# Patient Record
Sex: Male | Born: 1945
Health system: Southern US, Community
[De-identification: ages and names within clinical notes are randomized; demographics above are authoritative.]

## PROBLEM LIST (undated history)

## (undated) DIAGNOSIS — E785 Hyperlipidemia, unspecified: Secondary | ICD-10-CM

## (undated) DIAGNOSIS — I739 Peripheral vascular disease, unspecified: Secondary | ICD-10-CM

## (undated) DIAGNOSIS — C801 Malignant (primary) neoplasm, unspecified: Secondary | ICD-10-CM

## (undated) DIAGNOSIS — I10 Essential (primary) hypertension: Secondary | ICD-10-CM

## (undated) DIAGNOSIS — C3490 Malignant neoplasm of unspecified part of unspecified bronchus or lung: Secondary | ICD-10-CM

## (undated) HISTORY — PX: ESOPHAGOGASTRODUODENOSCOPY: SHX1529

## (undated) HISTORY — PX: BACK SURGERY: SHX140

## (undated) HISTORY — DX: Peripheral vascular disease, unspecified: I73.9

## (undated) HISTORY — PX: COLONOSCOPY: SHX174

---

## 2009-07-13 ENCOUNTER — Ambulatory Visit: Payer: Self-pay | Admitting: Internal Medicine

## 2009-08-28 ENCOUNTER — Ambulatory Visit: Payer: Self-pay | Admitting: Gastroenterology

## 2013-11-12 DIAGNOSIS — M7580 Other shoulder lesions, unspecified shoulder: Secondary | ICD-10-CM | POA: Insufficient documentation

## 2014-02-05 ENCOUNTER — Ambulatory Visit: Payer: Self-pay | Admitting: Gastroenterology

## 2014-02-08 LAB — PATHOLOGY REPORT

## 2014-03-15 DIAGNOSIS — M6530 Trigger finger, unspecified finger: Secondary | ICD-10-CM | POA: Insufficient documentation

## 2014-04-02 ENCOUNTER — Ambulatory Visit: Payer: Self-pay | Admitting: Gastroenterology

## 2014-06-04 DIAGNOSIS — Z86010 Personal history of colonic polyps: Secondary | ICD-10-CM | POA: Insufficient documentation

## 2014-06-10 ENCOUNTER — Ambulatory Visit: Payer: Self-pay | Admitting: Gastroenterology

## 2014-09-23 LAB — SURGICAL PATHOLOGY

## 2016-08-13 DIAGNOSIS — I70219 Atherosclerosis of native arteries of extremities with intermittent claudication, unspecified extremity: Secondary | ICD-10-CM | POA: Insufficient documentation

## 2017-05-22 ENCOUNTER — Emergency Department: Payer: Medicare Other

## 2017-05-22 ENCOUNTER — Other Ambulatory Visit: Payer: Self-pay

## 2017-05-22 ENCOUNTER — Encounter: Payer: Self-pay | Admitting: Emergency Medicine

## 2017-05-22 ENCOUNTER — Emergency Department
Admission: EM | Admit: 2017-05-22 | Discharge: 2017-05-22 | Disposition: A | Discharge status: Home / Self Care | Payer: Medicare Other | Attending: Emergency Medicine | Admitting: Emergency Medicine

## 2017-05-22 DIAGNOSIS — R05 Cough: Secondary | ICD-10-CM | POA: Diagnosis present

## 2017-05-22 DIAGNOSIS — R918 Other nonspecific abnormal finding of lung field: Secondary | ICD-10-CM

## 2017-05-22 DIAGNOSIS — F17200 Nicotine dependence, unspecified, uncomplicated: Secondary | ICD-10-CM | POA: Insufficient documentation

## 2017-05-22 DIAGNOSIS — M47812 Spondylosis without myelopathy or radiculopathy, cervical region: Secondary | ICD-10-CM | POA: Insufficient documentation

## 2017-05-22 HISTORY — DX: Malignant (primary) neoplasm, unspecified: C80.1

## 2017-05-22 LAB — CBC WITH DIFFERENTIAL/PLATELET
BASOS ABS: 0.1 10*3/uL (ref 0–0.1)
BASOS PCT: 1 %
EOS ABS: 0.4 10*3/uL (ref 0–0.7)
Eosinophils Relative: 4 %
HCT: 46.1 % (ref 40.0–52.0)
HEMOGLOBIN: 15 g/dL (ref 13.0–18.0)
Lymphocytes Relative: 15 %
Lymphs Abs: 1.7 10*3/uL (ref 1.0–3.6)
MCH: 29.1 pg (ref 26.0–34.0)
MCHC: 32.6 g/dL (ref 32.0–36.0)
MCV: 89.3 fL (ref 80.0–100.0)
Monocytes Absolute: 1.3 10*3/uL — ABNORMAL HIGH (ref 0.2–1.0)
Monocytes Relative: 11 %
NEUTROS PCT: 69 %
Neutro Abs: 8.3 10*3/uL — ABNORMAL HIGH (ref 1.4–6.5)
Platelets: 388 10*3/uL (ref 150–440)
RBC: 5.16 MIL/uL (ref 4.40–5.90)
RDW: 13.6 % (ref 11.5–14.5)
WBC: 11.9 10*3/uL — AB (ref 3.8–10.6)

## 2017-05-22 LAB — BASIC METABOLIC PANEL
Anion gap: 8 (ref 5–15)
BUN: 17 mg/dL (ref 6–20)
CALCIUM: 9.4 mg/dL (ref 8.9–10.3)
CHLORIDE: 102 mmol/L (ref 101–111)
CO2: 25 mmol/L (ref 22–32)
CREATININE: 1.13 mg/dL (ref 0.61–1.24)
GFR calc non Af Amer: >60 mL/min (ref >60)
Glucose, Bld: 110 mg/dL — ABNORMAL HIGH (ref 65–99)
Potassium: 4.8 mmol/L (ref 3.5–5.1)
SODIUM: 135 mmol/L (ref 135–145)

## 2017-05-22 MED ORDER — NAPROXEN 500 MG PO TABS
500.0000 mg | ORAL_TABLET | Freq: Once | ORAL | Status: AC
  Administered 2017-05-22: 500 mg via ORAL
  Filled 2017-05-22: qty 1

## 2017-05-22 MED ORDER — MELOXICAM 15 MG PO TABS: 15.0000 mg | ORAL_TABLET | Freq: Every day | ORAL | 0 refills | Status: DC

## 2017-05-22 MED ORDER — IOPAMIDOL (ISOVUE-300) INJECTION 61%
75.0000 mL | Freq: Once | INTRAVENOUS | Status: AC | PRN
  Administered 2017-05-22: 75 mL via INTRAVENOUS
  Filled 2017-05-22: qty 75

## 2017-05-22 NOTE — ED Notes (Signed)
Pain bilateral neck from the shoulders into the posterior head. Denies trauma. No stepoffs noted. Patient also c/o sore throat, nasal congestion, yellow mucous. Denies taking OTC meds.

## 2017-05-22 NOTE — ED Notes (Signed)
First nurse note   Presents with pain to neck and radiates into head  States pain started several months ago. Pain is worse today  denies any trauma or fever

## 2017-05-22 NOTE — ED Provider Notes (Signed)
[facility] Emergency Department Provider Note  ____________________________________________   First MD Initiated Contact with Patient 05/22/17 [id number]     (approximate)  I have reviewed the triage vital signs and the nursing notes.   HISTORY  Chief Complaint Cough and Neck Pain   HPI Marvin Williams is a 71 y.o. male who presents to the emergency department for evaluation of multiple medical complaints.   The patient states that he has not been to a doctor in many years because they are only concerned with throwing you a pill and sending you out the door.  He states that he does not take any medications because "those things will kill you."  He states that he has had a cough for at least a month which is now productive of yellow sputum.  He denies any fever.  He is also complaining of neck pain and stiffness that has been ongoing for the past several months.  He states that he has felt a lymph node on the left side of his neck for "a long time."  He has not sought medical attention for any of the above complaints nor has he attempted any alleviating measures.  He is here today, because he "wants to know what is wrong."  Past Medical History:  Diagnosis Date  . Cancer ([facility])    skin    There are no active problems to display for this patient.  Prior to Admission medications   Medication Sig Start Date End Date Taking? Authorizing Provider  meloxicam (MOBIC) 15 MG tablet Take 1 tablet (15 mg total) by mouth daily. 05/22/17   Victorino Dike, FNP    Allergies Patient has no known allergies.  No family history on file.  Social History Social History   Tobacco Use  . Smoking status: Current Every Day Smoker  . Smokeless tobacco: Never Used  Substance Use Topics  . Alcohol use: No    Frequency: Never  . Drug use: No    Review of Systems  Constitutional: No fever/chills Eyes: No visual changes. ENT: No sore throat. Cardiovascular: Denies chest  pain. Respiratory: Denies shortness of breath.  Positive for cough. Gastrointestinal: No abdominal pain.  No nausea, no vomiting.  Genitourinary: Negative for dysuria. Musculoskeletal: Negative for back pain.  Positive for neck pain. Skin: Negative for rash. Neurological: Negative for headaches, focal weakness or numbness. ____________________________________________   PHYSICAL EXAM:  VITAL SIGNS: ED Triage Vitals  Enc Vitals Group     BP 05/22/17 0804 (!) 163/90     Pulse Rate 05/22/17 0804 93     Resp 05/22/17 0804 18     Temp 05/22/17 0804 97.8 F (36.6 C)     Temp Source 05/22/17 0804 Oral     SpO2 05/22/17 0804 98 %     Weight 05/22/17 0804 150 lb (68 kg)     Height 05/22/17 0804 5\' 10"  (1.778 m)     Head Circumference --      Peak Flow --      Pain Score 05/22/17 0807 6     Pain Loc --      Pain Edu? --      Excl. in [place]? --     Constitutional: Alert and oriented. Well appearing and in no acute distress. Eyes: Conjunctivae are normal. PERRL.  Head: Atraumatic. Nose: No congestion/rhinnorhea. Mouth/Throat: Mucous membranes are moist.  Oropharynx non-erythematous. Neck: No stridor.  No focal midline tenderness with palpation over the cervical spine.  Paraspinal  tenderness on both sides with palpation and forward flexion of the neck. Cardiovascular: Normal rate, regular rhythm. Grossly normal heart sounds.  Good peripheral circulation. Respiratory: Normal respiratory effort.  No retractions. Lungs diminished throughout. Gastrointestinal: Soft and nontender. No distention. Musculoskeletal: No lower extremity tenderness nor edema.  No joint effusions. Neurologic:  Normal speech and language. No gross focal neurologic deficits are appreciated. No gait instability. Skin:  Skin is warm, dry and intact. No rash noted. Psychiatric: Mood and affect are normal. Speech and behavior are normal.  ____________________________________________   LABS (all labs ordered are listed,  but only abnormal results are displayed)  Labs Reviewed  CBC WITH DIFFERENTIAL/PLATELET - Abnormal; Notable for the following components:      Result Value   WBC 11.9 (*)    Neutro Abs 8.3 (*)    Monocytes Absolute 1.3 (*)    All other components within normal limits  BASIC METABOLIC PANEL - Abnormal; Notable for the following components:   Glucose, Bld 110 (*)    All other components within normal limits   ____________________________________________  EKG  Not indicated ____________________________________________  RADIOLOGY  Dg Chest 2 View  Result Date: 05/22/2017 CLINICAL DATA:  Cough. EXAM: CHEST  2 VIEW COMPARISON:  None. FINDINGS: The heart size and mediastinal contours are within normal limits. Atherosclerosis of thoracic aorta is noted. No pneumothorax or pleural effusion is noted. Diffuse interstitial densities are noted throughout both lungs most consistent with scarring or fibrosis, although acute superimposed edema or inflammation cannot be excluded. Right lower lobe mass is noted concerning for malignancy. The visualized skeletal structures are unremarkable. IMPRESSION: Right lower lobe mass is noted consistent with malignancy. CT scan of the chest is recommended for further evaluation. Mild diffuse interstitial densities are noted throughout both lungs consistent with scarring or fibrosis, although acute superimposed edema or inflammation cannot be excluded. Electronically Signed   By: Marijo Conception, M.D.   On: 05/22/2017 09:10   Dg Cervical Spine 2-3 Views  Result Date: 05/22/2017 CLINICAL DATA:  Neck pain for several months without specific injury. EXAM: CERVICAL SPINE - 2-3 VIEW COMPARISON:  None. FINDINGS: No evidence of fracture, bone lesion, or endplate erosion. Question ankylosis at C3-4 and C4-5. C5-6 and C6-7 predominant disc degeneration with spurring. Upper cervical facet arthropathy with spurring best seen at C2-3 and C3-4. No prevertebral thickening. Clear  apical lungs. IMPRESSION: Degenerative disease without acute or aggressive finding. Electronically Signed   By: Monte Fantasia M.D.   On: 05/22/2017 09:11   Ct Chest W Contrast  Result Date: 05/22/2017 CLINICAL DATA:  71 year old male with productive cough for the past month and a pulmonary mass seen on recent chest x-ray. Active smoker with a 55 pack year history. EXAM: CT CHEST WITH CONTRAST TECHNIQUE: Multidetector CT imaging of the chest was performed during intravenous contrast administration. CONTRAST:  81mL ISOVUE-300 IOPAMIDOL (ISOVUE-300) INJECTION 61% COMPARISON:  Chest x-ray 05/21/2017 FINDINGS: Cardiovascular: Conventional 3 vessel arch anatomy. No evidence of aneurysm. Atherosclerotic calcifications are present along the aorta. The heart is normal in size. No pericardial effusion. Atherosclerotic calcifications present along the coronary arteries. Pulmonary artery is normal in size. No central PE. Mediastinum/Nodes: Mediastinal lymph nodes are prominent but not enlarged by CT criteria. The largest measure up to 8 mm in short axis. Calcified sub coronal and right hilar nodes are present suggesting old granulomatous disease. Unremarkable thyroid gland. No mediastinal mass. The thoracic esophagus is unremarkable. Lungs/Pleura: Rounded mass with central low-attenuation in the posterior right lower  lobe measures 5.4 x 3.6 x 4.2 cm. The mass demonstrates a macrolobulated contour with subtle evidence of spiculation at the superior margin. Central low-attenuation is favored to reflect necrosis. There is a solitary dystrophic calcification at the superomedial margin. Background of relatively modest combined paraseptal and centrilobular pulmonary emphysema. Mild biapical pleuroparenchymal scarring. Calcified granuloma in the left lower lobe. No additional pulmonary nodules identified. Upper Abdomen: Circumscribed low-attenuation lesions within the liver are too small for accurate characterization but  statistically highly likely benign cysts. Similarly, there are numerous circumscribed water attenuation lesions within the visualized portions of the kidneys most consistent with simple cysts. Musculoskeletal: No acute fracture or aggressive appearing lytic or blastic osseous lesion. Multilevel degenerative disc disease. IMPRESSION: 1. Macrolobulated right lower lobe pulmonary mass with evidence of peripheral spiculation measures up to 5.4 cm and is highly concerning for a primary bronchogenic neoplasm. Central low-attenuation is consistent with central necrosis. Recommend further evaluation with PET-CT. No definitively enlarged by CT criteria mediastinal or hilar adenopathy. 2. Evidence of prior granulomatous disease with pulmonary granulomas in both lower lobes and calcified right hilar and mediastinal lymph nodes. 3. Modest combined centrilobular and paraseptal pulmonary emphysema. 4. Atherosclerosis including coronary artery and aortic calcifications. 5. Probable hepatic and bilateral renal cysts. Aortic Atherosclerosis (ICD10-I70.0) and Emphysema (ICD10-J43.9). Electronically Signed   By: Jacqulynn Cadet M.D.   On: 05/22/2017 10:45    ____________________________________________   PROCEDURES  Procedure(s) performed: None  Procedures  Critical Care performed: No  ____________________________________________   INITIAL IMPRESSION / ASSESSMENT AND PLAN / ED COURSE  71 year old male who presented to the emergency department for multiple medical complaints. Because of his 55 pack year smoking history and cough without fever or tachycardia for over a month, chest x-ray was completed followed by a chest CT. Results were discussed with the patient who was advised that it is imperative that he follow up with Dr. Tasia Catchings for further evaluation and diagnostic studies.   I spoke with Dr. Tasia Catchings who verbalized a plan to have the office contact the patient tomorrow to set up an appointment. He was made aware of  this, but would not give a clear answer of his intention to follow up. He was made aware that his condition is likely going to worsen, especially if he does not comply with treatment. He was discharged home, but advised to follow up with a primary care provider or return to the ER if he becomes short of breath or develops other symptoms of concern before seeing Dr. Tasia Catchings. His neck pain will be treated with Meloxicam. ____________________________________________   FINAL CLINICAL IMPRESSION(S) / ED DIAGNOSES  Final diagnoses:  Right lower lobe lung mass  Spondylosis of cervical region without myelopathy or radiculopathy     ED Discharge Orders        Ordered    meloxicam (MOBIC) 15 MG tablet  Daily     05/22/17 1145       Note:  This document was prepared using Dragon voice recognition software and may include unintentional dictation errors.    Victorino Dike, FNP 05/22/17 1559    Nena Polio, MD 05/22/17 2100

## 2017-05-22 NOTE — ED Triage Notes (Signed)
Pt to ED via POV c/o cough with yellow sputum x 1 month. Pt also c/o neck pain, states that it hurts to turn his head. Pt also fells like lymph nodes in his neck are swollen. Pt states that he has used OTC medications and it has helped with the pain in the neck but it does not completely go away. Pt states that he is having pain in the back of his head, head is tender to touch. Pt states that he is not sure if he has been running fever or not.

## 2017-05-26 ENCOUNTER — Other Ambulatory Visit: Payer: Self-pay

## 2017-05-26 ENCOUNTER — Encounter: Payer: Self-pay | Admitting: Oncology

## 2017-05-26 ENCOUNTER — Inpatient Hospital Stay: Payer: Medicare Other | Attending: Oncology | Admitting: Oncology

## 2017-05-26 ENCOUNTER — Encounter: Payer: Self-pay | Admitting: *Deleted

## 2017-05-26 VITALS — BP 179/79 | HR 69 | Temp 97.0°F | Ht 68.0 in | Wt 157.5 lb

## 2017-05-26 DIAGNOSIS — R918 Other nonspecific abnormal finding of lung field: Secondary | ICD-10-CM

## 2017-05-26 DIAGNOSIS — M542 Cervicalgia: Secondary | ICD-10-CM

## 2017-05-26 DIAGNOSIS — R030 Elevated blood-pressure reading, without diagnosis of hypertension: Secondary | ICD-10-CM | POA: Diagnosis not present

## 2017-05-26 DIAGNOSIS — F17210 Nicotine dependence, cigarettes, uncomplicated: Secondary | ICD-10-CM

## 2017-05-26 DIAGNOSIS — R05 Cough: Secondary | ICD-10-CM | POA: Insufficient documentation

## 2017-05-26 DIAGNOSIS — Z85828 Personal history of other malignant neoplasm of skin: Secondary | ICD-10-CM

## 2017-05-26 MED ORDER — NICOTINE 21-14-7 MG/24HR TD KIT: PACK | TRANSDERMAL | 0 refills | Status: DC

## 2017-05-26 NOTE — Progress Notes (Signed)
Patient here today as a new patient, referred by ED for lung mass

## 2017-05-26 NOTE — Progress Notes (Signed)
Hematology/Oncology Consult note [facility] Telephone:([phone] [phone]   Patient Care Team: Derinda Late, MD as PCP - General (Family Medicine)  REFERRING PROVIDER: Emergency room physician. CHIEF COMPLAINTS/PURPOSE OF CONSULTATION:  Lung mass evaluation.  HISTORY OF PRESENTING ILLNESS:  Marvin Williams is a  71 y.o.  male with PMH listed below who was referred to me for evaluation of lung mass. Patient presented to emergency room on 05/22/2017 complaining about neck pain, tightness. He also had a chronic cough. A CT chest scan was done which showed a concerning right bone mass. ER physician referred patient to see me for management. Patient reports that she is feeling better after he quit smoking since his ER visit. He request nicotine patches. He still has some cough. He reports that his chronic congestion postnasal drip has improved. He takes Mobic as needed for his neck pain which helps improving the neck discomfort. Denies weight loss, hemoptysis, headache, double vision. He does not take any other medication and denies any medical history. Is accompanied by his wife and daughter today.  Review of Systems  Constitutional: Negative for chills and weight loss.  HENT: Negative for hearing loss.   Eyes: Negative for double vision.  Respiratory: Positive for cough. Negative for hemoptysis.   Cardiovascular: Negative for chest pain.  Gastrointestinal: Negative for nausea.  Genitourinary: Negative for dysuria.  Musculoskeletal: Negative for myalgias.  Skin: Negative for rash.  Neurological: Negative for dizziness.  Endo/Heme/Allergies: Does not bruise/bleed easily.  Psychiatric/Behavioral: Negative for depression.    MEDICAL HISTORY:  Past Medical History:  Diagnosis Date  . Cancer [facility])    skin    SURGICAL HISTORY: Past Surgical History:  Procedure Laterality Date  . BACK SURGERY    . COLONOSCOPY      SOCIAL HISTORY: Social  History   Socioeconomic History  . Marital status: Married    Spouse name: Not on file  . Number of children: Not on file  . Years of education: Not on file  . Highest education level: Not on file  Social Needs  . Financial resource strain: Not on file  . Food insecurity - worry: Not on file  . Food insecurity - inability: Not on file  . Transportation needs - medical: Not on file  . Transportation needs - non-medical: Not on file  Occupational History  . Not on file  Tobacco Use  . Smoking status: Current Every Day Smoker  . Smokeless tobacco: Never Used  . Tobacco comment: Last cigarette on 05/22/17  Substance and Sexual Activity  . Alcohol use: No    Frequency: Never  . Drug use: No  . Sexual activity: Not on file  Other Topics Concern  . Not on file  Social History Narrative  . Not on file    FAMILY HISTORY: History reviewed. No pertinent family history.  ALLERGIES:  has No Known Allergies.  MEDICATIONS:  Current Outpatient Medications  Medication Sig Dispense Refill  . meloxicam (MOBIC) 15 MG tablet Take 1 tablet (15 mg total) by mouth daily. 30 tablet 0  . naproxen sodium (ALEVE) 220 MG tablet Take 220 mg by mouth.    . Nicotine 21-14-7 MG/24HR KIT Week 1-4, use 60m patch/day weeky 5-6 use 126mpatch/day Week 7-8 use 59m29match/day 1 each 0   No current facility-administered medications for this visit.      PHYSICAL EXAMINATION: ECOG PERFORMANCE STATUS: 0 - Asymptomatic Vitals:   05/26/17 1553  BP: (!) 179/79  Pulse: 69  Temp: (!) 97 F (36.1 C)  SpO2: 98%   Filed Weights   05/26/17 1553  Weight: 157 lb 8 oz (71.4 kg)    Physical Exam  Constitutional: He is oriented to person, place, and time and well-developed, well-nourished, and in no distress. No distress.  HENT:  Head: Normocephalic and atraumatic.  Mouth/Throat: No oropharyngeal exudate.  Eyes: Conjunctivae and EOM are normal. Pupils are equal, round, and reactive to light.  Neck:  Normal range of motion. Neck supple. No JVD present.  Cardiovascular: Normal rate, regular rhythm and normal heart sounds. Exam reveals no friction rub.  Pulmonary/Chest: Effort normal and breath sounds normal. He has no wheezes.  Abdominal: Soft. Bowel sounds are normal. He exhibits no distension.  Musculoskeletal: Normal range of motion. He exhibits no edema.  Lymphadenopathy:    He has no cervical adenopathy.  Neurological: He is alert and oriented to person, place, and time.  Skin: Skin is warm and dry.  Psychiatric: Affect normal.     LABORATORY DATA:  I have reviewed the data as listed Lab Results  Component Value Date   WBC 11.9 (H) 05/22/2017   HGB 15.0 05/22/2017   HCT 46.1 05/22/2017   MCV 89.3 05/22/2017   PLT 388 05/22/2017   Recent Labs    05/22/17 0940  NA 135  K 4.8  CL 102  CO2 25  GLUCOSE 110*  BUN 17  CREATININE 1.13  CALCIUM 9.4  GFRNONAA >60  GFRAA >60       ASSESSMENT & PLAN:  1. Lung mass   2. Elevated blood pressure reading    #Image results was discussed with patient. I also showed patient CT images per patient's request. Explained to patient that abdominal masses concerning for malignant process which required additional workup. I suggest PET scan followed by CT-guided lung mass biopsy. Most likely he'll also need MRI of brain. Patient voices understanding and agreed with the plan.  # Elevated blood pressure readings can be secondary to anxiety. We'll continue monitor.  #I provided him a prescription for nicotine patches Kit and encourage him to continue cessation of smoking.   All questions were answered. The patient knows to call the clinic with any problems questions or concerns.  Return of visit: A few days after CT-guided biopsy  To discuss about pathology result.  Thank you for this kind referral and the opportunity to participate in the care of this patient. A copy of today's note is routed to referring provider    Earlie Server, MD,  PhD Hematology Oncology [facility] at [facility] Pager- [phone] 05/26/2017

## 2017-05-27 NOTE — Progress Notes (Signed)
  Oncology Nurse Navigator Documentation  Navigator Location: [facility]-Med Onc (05/26/17 1630) Referral date to RadOnc/MedOnc: 05/22/17 (05/26/17 1630) )Navigator Encounter Type: Initial MedOnc (05/26/17 1630)   Abnormal Finding Date: 05/22/17 (05/26/17 1630)                   Treatment Phase: Abnormal Scans (05/26/17 1630) Barriers/Navigation Needs: Coordination of Care (05/26/17 1630)   Interventions: Coordination of Care (05/26/17 1630)   Coordination of Care: Appts;Radiology (05/26/17 1630)        Acuity: Level 2 (05/26/17 1630)   Acuity Level 2: Initial guidance, education and coordination as needed;Educational needs;Assistance expediting appointments (05/26/17 1630)  met with patient and family during initial med-onc consultation with Dr. Tasia Catchings. All questions answered at the time of visit. Assistance provided to expedite appts for PET scan, brain MRI, and percutaneous lung biopsy. Informed pt that will be notified when PET and MRI are scheduled. PET and MRI will be scheduled on same day per pt request. Pt informed that after PET scan results are available will plan for biopsy of lung mass. Informed pt that will be in touch after PET scan with appt for biopsy. Contact info given to pt and family and instructed to call with any further questions or needs. Pt and family verbalized understanding. Nothing further needed at this time.   Time Spent with Patient: 60 (05/26/17 1630)

## 2017-06-02 ENCOUNTER — Ambulatory Visit
Admission: RE | Admit: 2017-06-02 | Discharge: 2017-06-02 | Disposition: A | Discharge status: Home / Self Care | Payer: Medicare Other | Source: Ambulatory Visit | Attending: Oncology | Admitting: Oncology

## 2017-06-02 ENCOUNTER — Encounter
Admission: RE | Admit: 2017-06-02 | Discharge: 2017-06-02 | Disposition: A | Discharge status: Home / Self Care | Payer: Medicare Other | Source: Ambulatory Visit | Attending: Oncology | Admitting: Oncology

## 2017-06-02 DIAGNOSIS — I700 Atherosclerosis of aorta: Secondary | ICD-10-CM | POA: Diagnosis not present

## 2017-06-02 DIAGNOSIS — J0100 Acute maxillary sinusitis, unspecified: Secondary | ICD-10-CM | POA: Diagnosis not present

## 2017-06-02 DIAGNOSIS — J439 Emphysema, unspecified: Secondary | ICD-10-CM | POA: Insufficient documentation

## 2017-06-02 DIAGNOSIS — N400 Enlarged prostate without lower urinary tract symptoms: Secondary | ICD-10-CM | POA: Diagnosis not present

## 2017-06-02 DIAGNOSIS — R030 Elevated blood-pressure reading, without diagnosis of hypertension: Secondary | ICD-10-CM | POA: Diagnosis not present

## 2017-06-02 DIAGNOSIS — R918 Other nonspecific abnormal finding of lung field: Secondary | ICD-10-CM | POA: Diagnosis not present

## 2017-06-02 DIAGNOSIS — C3490 Malignant neoplasm of unspecified part of unspecified bronchus or lung: Secondary | ICD-10-CM | POA: Diagnosis not present

## 2017-06-02 LAB — GLUCOSE, CAPILLARY: GLUCOSE-CAPILLARY: 92 mg/dL (ref 65–99)

## 2017-06-02 MED ORDER — FLUDEOXYGLUCOSE F - 18 (FDG) INJECTION
12.7100 | Freq: Once | INTRAVENOUS | Status: AC | PRN
  Administered 2017-06-02: 12.71 via INTRAVENOUS

## 2017-06-02 MED ORDER — GADOBENATE DIMEGLUMINE 529 MG/ML IV SOLN
15.0000 mL | Freq: Once | INTRAVENOUS | Status: AC | PRN
  Administered 2017-06-02: 14 mL via INTRAVENOUS

## 2017-06-09 ENCOUNTER — Other Ambulatory Visit: Payer: Self-pay | Admitting: Radiology

## 2017-06-10 ENCOUNTER — Other Ambulatory Visit: Payer: Self-pay | Admitting: Radiology

## 2017-06-13 ENCOUNTER — Ambulatory Visit
Admission: RE | Admit: 2017-06-13 | Discharge: 2017-06-13 | Disposition: A | Discharge status: Home / Self Care | Payer: Medicare Other | Source: Ambulatory Visit | Attending: Oncology | Admitting: Oncology

## 2017-06-13 DIAGNOSIS — Z9889 Other specified postprocedural states: Secondary | ICD-10-CM | POA: Diagnosis not present

## 2017-06-13 DIAGNOSIS — C3431 Malignant neoplasm of lower lobe, right bronchus or lung: Secondary | ICD-10-CM | POA: Diagnosis not present

## 2017-06-13 DIAGNOSIS — Z79899 Other long term (current) drug therapy: Secondary | ICD-10-CM | POA: Diagnosis not present

## 2017-06-13 DIAGNOSIS — N320 Bladder-neck obstruction: Secondary | ICD-10-CM | POA: Insufficient documentation

## 2017-06-13 DIAGNOSIS — J0100 Acute maxillary sinusitis, unspecified: Secondary | ICD-10-CM | POA: Diagnosis not present

## 2017-06-13 DIAGNOSIS — R0789 Other chest pain: Secondary | ICD-10-CM | POA: Diagnosis not present

## 2017-06-13 DIAGNOSIS — Z85828 Personal history of other malignant neoplasm of skin: Secondary | ICD-10-CM | POA: Insufficient documentation

## 2017-06-13 DIAGNOSIS — R918 Other nonspecific abnormal finding of lung field: Secondary | ICD-10-CM | POA: Diagnosis not present

## 2017-06-13 DIAGNOSIS — R590 Localized enlarged lymph nodes: Secondary | ICD-10-CM | POA: Insufficient documentation

## 2017-06-13 DIAGNOSIS — M542 Cervicalgia: Secondary | ICD-10-CM | POA: Diagnosis not present

## 2017-06-13 DIAGNOSIS — J8410 Pulmonary fibrosis, unspecified: Secondary | ICD-10-CM | POA: Insufficient documentation

## 2017-06-13 DIAGNOSIS — N400 Enlarged prostate without lower urinary tract symptoms: Secondary | ICD-10-CM | POA: Diagnosis not present

## 2017-06-13 DIAGNOSIS — I700 Atherosclerosis of aorta: Secondary | ICD-10-CM | POA: Insufficient documentation

## 2017-06-13 DIAGNOSIS — Z791 Long term (current) use of non-steroidal anti-inflammatories (NSAID): Secondary | ICD-10-CM | POA: Insufficient documentation

## 2017-06-13 DIAGNOSIS — F17200 Nicotine dependence, unspecified, uncomplicated: Secondary | ICD-10-CM | POA: Insufficient documentation

## 2017-06-13 DIAGNOSIS — J439 Emphysema, unspecified: Secondary | ICD-10-CM | POA: Diagnosis not present

## 2017-06-13 LAB — CBC
HEMATOCRIT: 45.6 % (ref 40.0–52.0)
Hemoglobin: 15.1 g/dL (ref 13.0–18.0)
MCH: 30 pg (ref 26.0–34.0)
MCHC: 33.2 g/dL (ref 32.0–36.0)
MCV: 90.4 fL (ref 80.0–100.0)
Platelets: 316 10*3/uL (ref 150–440)
RBC: 5.05 MIL/uL (ref 4.40–5.90)
RDW: 14.2 % (ref 11.5–14.5)
WBC: 10.2 10*3/uL (ref 3.8–10.6)

## 2017-06-13 LAB — APTT: aPTT: 39 seconds — ABNORMAL HIGH (ref 24–36)

## 2017-06-13 LAB — PROTIME-INR
INR: 0.92
Prothrombin Time: 12.3 seconds (ref 11.4–15.2)

## 2017-06-13 MED ORDER — SODIUM CHLORIDE 0.9 % IV SOLN
INTRAVENOUS | Status: DC
  Administered 2017-06-13: 10:00:00 via INTRAVENOUS

## 2017-06-13 MED ORDER — HYDRALAZINE HCL 20 MG/ML IJ SOLN: 10.0000 mg | Freq: Once | INTRAMUSCULAR | Status: DC

## 2017-06-13 MED ORDER — MIDAZOLAM HCL 5 MG/5ML IJ SOLN
INTRAMUSCULAR | Status: AC | PRN
  Administered 2017-06-13: 0.5 mg via INTRAVENOUS
  Administered 2017-06-13: 1 mg via INTRAVENOUS
  Administered 2017-06-13: 0.5 mg via INTRAVENOUS

## 2017-06-13 MED ORDER — MIDAZOLAM HCL 5 MG/5ML IJ SOLN
INTRAMUSCULAR | Status: AC
  Filled 2017-06-13: qty 5

## 2017-06-13 MED ORDER — FENTANYL CITRATE (PF) 100 MCG/2ML IJ SOLN
INTRAMUSCULAR | Status: AC | PRN
  Administered 2017-06-13: 50 ug via INTRAVENOUS

## 2017-06-13 MED ORDER — LIDOCAINE HCL (PF) 1 % IJ SOLN
INTRAMUSCULAR | Status: AC | PRN
  Administered 2017-06-13: 5 mL

## 2017-06-13 MED ORDER — FENTANYL CITRATE (PF) 100 MCG/2ML IJ SOLN
INTRAMUSCULAR | Status: AC
  Filled 2017-06-13: qty 4

## 2017-06-13 NOTE — H&P (Signed)
Chief Complaint: Patient was seen in consultation today for lung mass  Referring Physician(s): Yu,Zhou  Supervising Physician: Aletta Edouard  Patient Status: [facility] - Out-pt  History of Present Illness: Marvin Williams is a 72 y.o. male with past medical history of skin cancer, and chronic cough who recently presented to [facility] ED with chest tightness and neck pain.   CT Chest 05/22/17 showed: 1. Macrolobulated right lower lobe pulmonary mass with evidence of peripheral spiculation measures up to 5.4 cm and is highly concerning for a primary bronchogenic neoplasm. Central low-attenuation is consistent with central necrosis. Recommend further evaluation with PET-CT. No definitively enlarged by CT criteria mediastinal or hilar adenopathy. 2. Evidence of prior granulomatous disease with pulmonary granulomas in both lower lobes and calcified right hilar and mediastinal lymph nodes. 3. Modest combined centrilobular and paraseptal pulmonary emphysema. 4. Atherosclerosis including coronary artery and aortic calcifications. 5. Probable hepatic and bilateral renal cysts.  PET 06/02/17 showed: 6 cm hypermetabolic mass in posterior right lower lobe, consistent with bronchogenic carcinoma.  IR consulted for lung mass biopsy at the request of Dr. Tasia Catchings. Case reviewed and approved by Dr. Anselm Pancoast.   Patient presents for procedure today in his usual state of health.  He has been NPO.  He does not take blood thinners.   Past Medical History:  Diagnosis Date  . Cancer [facility])    skin    Past Surgical History:  Procedure Laterality Date  . BACK SURGERY    . COLONOSCOPY      Allergies: Patient has no known allergies.  Medications: Prior to Admission medications   Medication Sig Start Date End Date Taking? Authorizing Provider  meloxicam (MOBIC) 15 MG tablet Take 1 tablet (15 mg total) by mouth daily. 05/22/17  Yes Triplett, Cari B, FNP  naproxen sodium (ALEVE) 220 MG tablet Take 220 mg  by mouth.    [provider]  Nicotine 21-14-7 MG/24HR KIT Week 1-4, use 13m patch/day weeky 5-6 use 156mpatch/day Week 7-8 use 67m65match/day Patient not taking: Reported on 06/13/2017 05/26/17   Yu,Earlie ServerD     History reviewed. No pertinent family history.  Social History   Socioeconomic History  . Marital status: Married    Spouse name: None  . Number of children: None  . Years of education: None  . Highest education level: None  Social Needs  . Financial resource strain: None  . Food insecurity - worry: None  . Food insecurity - inability: None  . Transportation needs - medical: None  . Transportation needs - non-medical: None  Occupational History  . None  Tobacco Use  . Smoking status: Current Every Day Smoker  . Smokeless tobacco: Never Used  . Tobacco comment: Last cigarette on 05/22/17  Substance and Sexual Activity  . Alcohol use: No    Frequency: Never  . Drug use: No  . Sexual activity: None  Other Topics Concern  . None  Social History Narrative  . None    Review of Systems  Constitutional: Negative for fatigue and fever.  Respiratory: Negative for cough and shortness of breath.   Cardiovascular: Negative for chest pain.  Gastrointestinal: Negative for abdominal pain.  Musculoskeletal: Negative for back pain and gait problem.  Psychiatric/Behavioral: Negative for behavioral problems and confusion.    Vital Signs: BP (!) 183/90   Pulse 70   Temp 97.6 F (36.4 C) (Oral)   Resp 18   Ht 5' 8" (1.727 m)   Wt 150 lb (68  kg)   SpO2 99%   BMI 22.81 kg/m   Physical Exam  Constitutional: He is oriented to person, place, and time. He appears well-developed.  Cardiovascular: Normal rate, regular rhythm and normal heart sounds.  Pulmonary/Chest: Effort normal and breath sounds normal. No respiratory distress.  Abdominal: Soft.  Neurological: He is alert and oriented to person, place, and time.  Skin: Skin is warm and dry.  Psychiatric: He  has a normal mood and affect. His behavior is normal. Judgment and thought content normal.  Nursing note and vitals reviewed.   Imaging: Dg Chest 2 View  Result Date: 05/22/2017 CLINICAL DATA:  Cough. EXAM: CHEST  2 VIEW COMPARISON:  None. FINDINGS: The heart size and mediastinal contours are within normal limits. Atherosclerosis of thoracic aorta is noted. No pneumothorax or pleural effusion is noted. Diffuse interstitial densities are noted throughout both lungs most consistent with scarring or fibrosis, although acute superimposed edema or inflammation cannot be excluded. Right lower lobe mass is noted concerning for malignancy. The visualized skeletal structures are unremarkable. IMPRESSION: Right lower lobe mass is noted consistent with malignancy. CT scan of the chest is recommended for further evaluation. Mild diffuse interstitial densities are noted throughout both lungs consistent with scarring or fibrosis, although acute superimposed edema or inflammation cannot be excluded. Electronically Signed   By: Marijo Conception, M.D.   On: 05/22/2017 09:10   Dg Cervical Spine 2-3 Views  Result Date: 05/22/2017 CLINICAL DATA:  Neck pain for several months without specific injury. EXAM: CERVICAL SPINE - 2-3 VIEW COMPARISON:  None. FINDINGS: No evidence of fracture, bone lesion, or endplate erosion. Question ankylosis at C3-4 and C4-5. C5-6 and C6-7 predominant disc degeneration with spurring. Upper cervical facet arthropathy with spurring best seen at C2-3 and C3-4. No prevertebral thickening. Clear apical lungs. IMPRESSION: Degenerative disease without acute or aggressive finding. Electronically Signed   By: Monte Fantasia M.D.   On: 05/22/2017 09:11   Ct Chest W Contrast  Result Date: 05/22/2017 CLINICAL DATA:  72 year old male with productive cough for the past month and a pulmonary mass seen on recent chest x-ray. Active smoker with a 55 pack year history. EXAM: CT CHEST WITH CONTRAST TECHNIQUE:  Multidetector CT imaging of the chest was performed during intravenous contrast administration. CONTRAST:  25m ISOVUE-300 IOPAMIDOL (ISOVUE-300) INJECTION 61% COMPARISON:  Chest x-ray 05/21/2017 FINDINGS: Cardiovascular: Conventional 3 vessel arch anatomy. No evidence of aneurysm. Atherosclerotic calcifications are present along the aorta. The heart is normal in size. No pericardial effusion. Atherosclerotic calcifications present along the coronary arteries. Pulmonary artery is normal in size. No central PE. Mediastinum/Nodes: Mediastinal lymph nodes are prominent but not enlarged by CT criteria. The largest measure up to 8 mm in short axis. Calcified sub coronal and right hilar nodes are present suggesting old granulomatous disease. Unremarkable thyroid gland. No mediastinal mass. The thoracic esophagus is unremarkable. Lungs/Pleura: Rounded mass with central low-attenuation in the posterior right lower lobe measures 5.4 x 3.6 x 4.2 cm. The mass demonstrates a macrolobulated contour with subtle evidence of spiculation at the superior margin. Central low-attenuation is favored to reflect necrosis. There is a solitary dystrophic calcification at the superomedial margin. Background of relatively modest combined paraseptal and centrilobular pulmonary emphysema. Mild biapical pleuroparenchymal scarring. Calcified granuloma in the left lower lobe. No additional pulmonary nodules identified. Upper Abdomen: Circumscribed low-attenuation lesions within the liver are too small for accurate characterization but statistically highly likely benign cysts. Similarly, there are numerous circumscribed water attenuation lesions within the  visualized portions of the kidneys most consistent with simple cysts. Musculoskeletal: No acute fracture or aggressive appearing lytic or blastic osseous lesion. Multilevel degenerative disc disease. IMPRESSION: 1. Macrolobulated right lower lobe pulmonary mass with evidence of peripheral  spiculation measures up to 5.4 cm and is highly concerning for a primary bronchogenic neoplasm. Central low-attenuation is consistent with central necrosis. Recommend further evaluation with PET-CT. No definitively enlarged by CT criteria mediastinal or hilar adenopathy. 2. Evidence of prior granulomatous disease with pulmonary granulomas in both lower lobes and calcified right hilar and mediastinal lymph nodes. 3. Modest combined centrilobular and paraseptal pulmonary emphysema. 4. Atherosclerosis including coronary artery and aortic calcifications. 5. Probable hepatic and bilateral renal cysts. Aortic Atherosclerosis (ICD10-I70.0) and Emphysema (ICD10-J43.9). Electronically Signed   By: Jacqulynn Cadet M.D.   On: 05/22/2017 10:45   Mr Jeri Cos VH Contrast  Result Date: 06/03/2017 CLINICAL DATA:  Initial evaluation for lung cancer, staging exam. EXAM: MRI HEAD WITHOUT AND WITH CONTRAST TECHNIQUE: Multiplanar, multiecho pulse sequences of the brain and surrounding structures were obtained without and with intravenous contrast. CONTRAST:  4m MULTIHANCE GADOBENATE DIMEGLUMINE 529 MG/ML IV SOLN COMPARISON:  None available. FINDINGS: Brain: Age related cerebral atrophy. Mild chronic small vessel ischemic disease present within the periventricular white matter. No evidence for acute infarct. Gray-white matter differentiation maintained. No evidence for chronic infarction. No foci of susceptibility artifact to suggest acute or chronic intracranial hemorrhage. No mass lesion, midline shift or mass effect. Ventricles normal size without hydrocephalus. No abnormal enhancement to suggest metastatic disease. No extra-axial fluid collection. Major dural sinuses are grossly patent. Pituitary gland suprasellar region normal. Midline structures intact and normal. Vascular: Major intracranial vascular flow voids are maintained. Skull and upper cervical spine: Craniocervical junction normal. Visualized upper cervical spine  normal. Bone marrow signal intensity within normal limits. No discrete osseous lesions. Scalp soft tissues unremarkable. Sinuses/Orbits: Globes and orbital soft tissues within normal limits. Scattered mucosal thickening throughout the ethmoidal air cells and maxillary sinuses. Superimposed air-fluid level within the right maxillary sinus. No mastoid effusion. Inner ear structures normal. Other: None. IMPRESSION: 1. No MRI evidence for intracranial metastatic disease. No other acute intracranial abnormality. 2. Mild chronic microvascular ischemic disease for age. 3. Acute right maxillary sinusitis. Electronically Signed   By: BJeannine BogaM.D.   On: 06/03/2017 07:20   Nm Pet Image Initial (pi) Skull Base To Thigh  Result Date: 06/02/2017 CLINICAL DATA:  Initial treatment strategy for right lower lobe lung mass. EXAM: NUCLEAR MEDICINE PET SKULL BASE TO THIGH TECHNIQUE: 12.7 mCi F-18 FDG was injected intravenously. Full-ring PET imaging was performed from the skull base to thigh after the radiotracer. CT data was obtained and used for attenuation correction and anatomic localization. FASTING BLOOD GLUCOSE:  Value: 92 mg/dl COMPARISON:  Chest CT on 05/22/2017 FINDINGS: NECK: No hypermetabolic lymph nodes. Focal FDG uptake is seen within the right thyroid lobe with SUV max of 3.6. CHEST: 5.7 x 3.4 cm macrolobulated mass in the posterior right lower lobe shows intense FDG activity, with SUV max of 11.5. An area of central photopenia is noted, consistent with central necrosis. Several calcified granulomas are seen in both lower lobes, however no other suspicious pulmonary nodules or masses are seen on CT images. Mild emphysema noted. No evidence of pleural effusion. Mild asymmetric FDG activity is seen in the right hilum with SUV max of 2.9. This corresponds to a densely calcified hilar lymph node, consistent with old granulomatous disease. No other hypermetabolic lymph nodes identified.  ABDOMEN/PELVIS: No  abnormal hypermetabolic activity within the liver, pancreas, adrenal glands, or spleen. No hypermetabolic lymph nodes in the abdomen or pelvis. Small hepatic cyst again noted as well as bilateral renal cysts. Moderate left renal pelvicaliectasis is seen without evidence of ureterectasis, and this could be due to a congenital partial left UPJ obstruction or chronic vesicoureteral reflux. Small urinary bladder with moderate diffuse wall thickening and small right posterior diverticulum. This is most likely due to chronic bladder outlet obstruction. Mildly enlarged prostate. Aortic atherosclerosis. SKELETON: No focal hypermetabolic bone lesions to suggest skeletal metastasis. IMPRESSION: 6 cm hypermetabolic mass in posterior right lower lobe, consistent with bronchogenic carcinoma. No evidence of thoracic lymph node or distant metastatic disease. Focal FDG uptake within right thyroid lobe. Thyroid carcinoma cannot be excluded. Thyroid ultrasound is recommended for further evaluation. This recommendation follows ACR consensus guidelines: Managing Incidental Thyroid Nodules Detected on Imaging: White Paper of the ACR Incidental Thyroid Findings Committee. J Am Coll Radiol 2015;12(2):143-150. Mildly enlarged prostate and findings of chronic bladder outlet obstruction. Moderate left renal pelvicaliectasis may be due to congenital partial UPJ obstruction or chronic vesicoureteral reflux. Consider nuclear medicine renal scan with Lasix administration for further evaluation. Aortic Atherosclerosis (ICD10-I70.0) and Emphysema (ICD10-J43.9). Electronically Signed   By: Earle Gell M.D.   On: 06/02/2017 12:12    Labs:  CBC: Recent Labs    05/22/17 0940 06/13/17 1011  WBC 11.9* 10.2  HGB 15.0 15.1  HCT 46.1 45.6  PLT 388 316    COAGS: Recent Labs    06/13/17 1011  INR 0.92  APTT 39*    BMP: Recent Labs    05/22/17 0940  NA 135  K 4.8  CL 102  CO2 25  GLUCOSE 110*  BUN 17  CALCIUM 9.4  CREATININE  1.13  GFRNONAA >60  GFRAA >60    LIVER FUNCTION TESTS: No results for input(s): BILITOT, AST, ALT, ALKPHOS, PROT, ALBUMIN in the last 8760 hours.  TUMOR MARKERS: No results for input(s): AFPTM, CEA, CA199, CHROMGRNA in the last 8760 hours.  Assessment and Plan: Patient with past medical history of skin cancer, chronic cough presents with complaint of lung mass.  IR consulted for lung mass biopsy at the request of Dr. Tasia Catchings. Case reviewed by Dr. Anselm Pancoast who approves patient for procedure.  Patient presents today in their usual state of health.  He has been NPO and is not currently on blood thinners.  Risks and benefits discussed with the patient including, but not limited to bleeding, hemoptysis, respiratory failure requiring intubation, infection, pneumothorax requiring chest tube placement, stroke from air embolism or even death. All of the patient's questions were answered, patient is agreeable to proceed. Consent signed and in chart. His blood pressure is elevated today likely related to anxiety.  Will monitor.   Thank you for this interesting consult.  I greatly enjoyed meeting SUVAN STCYR and look forward to participating in their care.  A copy of this report was sent to the requesting provider on this date.  Electronically Signed: Docia Barrier, PA 06/13/2017, 10:52 AM   I spent a total of  30 Minutes   in face to face in clinical consultation, greater than 50% of which was counseling/coordinating care for lung mass.

## 2017-06-13 NOTE — Procedures (Signed)
Interventional Radiology Procedure Note  Procedure: CT guided core biopsy of RLL lung mass  Complications: None  Estimated Blood Loss: < 10 mL  Findings: RLL lung mass measuring 5.3 cm in greatest diameter.  Via 17 G needle, 18 G core biopsy x 2.  No PTX on post biopsy CT.  Venetia Night. Kathlene Cote, M.D Pager:  [phone]

## 2017-06-14 ENCOUNTER — Other Ambulatory Visit: Payer: Self-pay | Admitting: Pathology

## 2017-06-14 LAB — SURGICAL PATHOLOGY

## 2017-06-16 DIAGNOSIS — C3491 Malignant neoplasm of unspecified part of right bronchus or lung: Secondary | ICD-10-CM | POA: Insufficient documentation

## 2017-06-16 NOTE — Progress Notes (Signed)
Hematology/Oncology Follow up note [facility] [phone] [phone]   Patient Care Team: Derinda Late, MD as PCP - General (Family Medicine) Telford Nab, RN as Registered Nurse  REFERRING PROVIDER: Emergency room physician. REASON FOR VISIT Follow up for treatment of  HISTORY OF PRESENTING ILLNESS:  Marvin Williams is a  72 y.o.  male with PMH listed below who was referred to me for evaluation of lung mass. Patient presented to emergency room on 05/22/2017 complaining about neck pain, tightness. He also had a chronic cough. A CT chest scan was done which showed a concerning right bone mass. ER physician referred patient to see me for management. Patient reports that she is feeling better after he quit smoking since his ER visit. He request nicotine patches. He still has some cough. He reports that his chronic congestion postnasal drip has improved. He takes Mobic as needed for his neck pain which helps improving the neck discomfort. Denies weight loss, hemoptysis, headache, double vision. He does not take any other medication and denies any medical history.  INTERVAL HISTORY Marvin Williams is a 72 y.o. male who has above history reviewed by me today presents for follow up visit for management of newly diagnosed lung cancer. Problems and complaints are listed below:Is accompanied by his wife today. Patient reports feeling well. He has a good appetite. He quit smoking after finding out he has long mass. During the interval, he has felt difficulty of quitting and has come back to smoking about 1-2 cigarettes a day. Denies shortness of breath, chest pain, hemoptysis, headache, abdominal pain.  Review of Systems  Constitutional: Negative for chills, diaphoresis and weight loss.  HENT: Negative for ear discharge and hearing loss.   Eyes: Negative for double vision and photophobia.  Respiratory: Positive for cough. Negative for hemoptysis.     Cardiovascular: Negative for chest pain and orthopnea.  Gastrointestinal: Negative for abdominal pain, nausea and vomiting.  Genitourinary: Negative for dysuria and urgency.  Musculoskeletal: Negative for myalgias and neck pain.  Skin: Negative for itching and rash.  Neurological: Negative for dizziness, weakness and headaches.  Endo/Heme/Allergies: Does not bruise/bleed easily.  Psychiatric/Behavioral: Negative for depression and hallucinations.    MEDICAL HISTORY:  Past Medical History:  Diagnosis Date  . Cancer [facility])    skin    SURGICAL HISTORY: Past Surgical History:  Procedure Laterality Date  . BACK SURGERY    . COLONOSCOPY      SOCIAL HISTORY: Social History   Socioeconomic History  . Marital status: Married    Spouse name: Not on file  . Number of children: Not on file  . Years of education: Not on file  . Highest education level: Not on file  Social Needs  . Financial resource strain: Not on file  . Food insecurity - worry: Not on file  . Food insecurity - inability: Not on file  . Transportation needs - medical: Not on file  . Transportation needs - non-medical: Not on file  Occupational History  . Not on file  Tobacco Use  . Smoking status: Current Every Day Smoker  . Smokeless tobacco: Never Used  . Tobacco comment: Last cigarette on 05/22/17  Substance and Sexual Activity  . Alcohol use: No    Frequency: Never  . Drug use: No  . Sexual activity: Not on file  Other Topics Concern  . Not on file  Social History Narrative  . Not on file    FAMILY HISTORY: No family  history on file.  ALLERGIES:  has No Known Allergies.  MEDICATIONS:  Current Outpatient Medications  Medication Sig Dispense Refill  . naproxen sodium (ALEVE) 220 MG tablet Take 220 mg by mouth.    . varenicline (CHANTIX) 0.5 MG tablet Take 0.'5mg'$  daily for 3 days. Then take 0.'5mg'$  twice daily for 4 days.  Then take '1mg'$  twice daily. 60 tablet 0   No current facility-administered  medications for this visit.      PHYSICAL EXAMINATION: ECOG PERFORMANCE STATUS: 0 - Asymptomatic There were no vitals filed for this visit. There were no vitals filed for this visit.  Physical Exam  Constitutional: He is oriented to person, place, and time and well-developed, well-nourished, and in no distress. No distress.  HENT:  Head: Normocephalic and atraumatic.  Mouth/Throat: Oropharynx is clear and moist. No oropharyngeal exudate.  Eyes: Conjunctivae and EOM are normal. Pupils are equal, round, and reactive to light.  Neck: Normal range of motion. Neck supple. No JVD present.  Cardiovascular: Normal rate, regular rhythm and normal heart sounds. Exam reveals no friction rub.  No murmur heard. Pulmonary/Chest: Effort normal and breath sounds normal. He has no wheezes. He has no rales.  Decreased breath sounds bilaterally.  Abdominal: Soft. Bowel sounds are normal. He exhibits no distension. There is no tenderness.  Musculoskeletal: Normal range of motion. He exhibits no edema.  Lymphadenopathy:    He has no cervical adenopathy.  Neurological: He is alert and oriented to person, place, and time. Coordination normal.  Skin: Skin is warm and dry. No erythema.  Psychiatric: Affect normal.     LABORATORY DATA:  I have reviewed the data as listed Lab Results  Component Value Date   WBC 10.2 06/13/2017   HGB 15.1 06/13/2017   HCT 45.6 06/13/2017   MCV 90.4 06/13/2017   PLT 316 06/13/2017   Recent Labs    05/22/17 0940  NA 135  K 4.8  CL 102  CO2 25  GLUCOSE 110*  BUN 17  CREATININE 1.13  CALCIUM 9.4  GFRNONAA >60  GFRAA >60       ASSESSMENT & PLAN:  Cancer Staging Squamous carcinoma of lung, right ([place]) Staging form: Lung, AJCC 8th Edition - Clinical stage from 06/16/2017: Stage IIB (cT3, cN0, cM0) - Signed by Earlie Server, MD on 06/16/2017  1. Squamous carcinoma of lung, right (HCC)   2. Elevated blood pressure reading   3. Abnormal positron emission  tomography (PET) scan    #Image results and pathology results were discussed with patient discussed patient about the diagnosis of lung squamous carcinoma. His case was presented on tumor board. He has clinical T3 N0 M0 disease. The hilar lymph nodes up believe to be secondary to chronic granulomatous changes. MRI is negative.  Discussed with patient that it appears that his cancer may be resectable. We'll schedule him to obtain pulmonary function test and we'll refer him to see surgeon Dr. Faith Rogue for evaluation of surgery.   # Elevated blood pressure readings can be secondary to anxiety due to the newly diagnosed cancer.Burnis Medin continue monitor. If his blood pressure is persistently high, we will start him with blood pressure medication.  #Last visit I prescribed nicotine patches And encourage him to continue cessation of smoking. Patient reports that his insurance does not cover and wondered if he can takes Chantix. Prescription of Chantix prescribed patient. Discussed with patient about importance of quitting smoking and he is motivated and will try. I provided him a prescription for nicotine  patches Kit and encourage him to continue cessation of smoking.   # Focal uptake in the thyroid area, we'll obtain a thyroid ultrasound for further evaluation. I spent time answering all the questions patient and his wife have. In all questions answered to their satisfaction. Per patient's request, his daughter Lattie Haw is also on the phone/Speaker and participated in discussion.  The patient knows to call the clinic with any problems questions or concerns.  Return of visit: After his surgery evaluation.  Total face to face encounter time for this patient visit was 40 min. >50% of the time was  spent in counseling and coordination of care.    Earlie Server, MD, PhD Hematology Oncology [facility] at [facility] Pager- [phone] 06/16/2017

## 2017-06-17 ENCOUNTER — Encounter: Payer: Self-pay | Admitting: *Deleted

## 2017-06-17 ENCOUNTER — Inpatient Hospital Stay: Payer: Medicare Other | Attending: Oncology | Admitting: Oncology

## 2017-06-17 ENCOUNTER — Encounter: Payer: Self-pay | Admitting: Oncology

## 2017-06-17 ENCOUNTER — Other Ambulatory Visit: Payer: Self-pay

## 2017-06-17 VITALS — BP 162/88 | HR 69 | Temp 97.5°F | Resp 16 | Ht 68.0 in | Wt 155.2 lb

## 2017-06-17 DIAGNOSIS — R030 Elevated blood-pressure reading, without diagnosis of hypertension: Secondary | ICD-10-CM

## 2017-06-17 DIAGNOSIS — Z85828 Personal history of other malignant neoplasm of skin: Secondary | ICD-10-CM | POA: Diagnosis not present

## 2017-06-17 DIAGNOSIS — F17210 Nicotine dependence, cigarettes, uncomplicated: Secondary | ICD-10-CM | POA: Diagnosis not present

## 2017-06-17 DIAGNOSIS — C3491 Malignant neoplasm of unspecified part of right bronchus or lung: Secondary | ICD-10-CM | POA: Diagnosis not present

## 2017-06-17 DIAGNOSIS — M542 Cervicalgia: Secondary | ICD-10-CM

## 2017-06-17 DIAGNOSIS — R948 Abnormal results of function studies of other organs and systems: Secondary | ICD-10-CM

## 2017-06-17 MED ORDER — VARENICLINE TARTRATE 0.5 MG PO TABS: ORAL_TABLET | ORAL | 0 refills | Status: DC

## 2017-06-17 NOTE — Progress Notes (Signed)
  Oncology Nurse Navigator Documentation  Navigator Location: [facility] (06/17/17 1500)   )Navigator Encounter Type: Diagnostic Results;Follow-up Appt (06/17/17 1500)     Confirmed Diagnosis Date: 06/14/17 (06/17/17 1500)                 Treatment Phase: Pre-Tx/Tx Discussion (06/17/17 1500) Barriers/Navigation Needs: Coordination of Care;Education (06/17/17 1500) Education: Understanding Cancer/ Treatment Options;Newly Diagnosed Cancer Education;Preparing for Upcoming Surgery/ Treatment (06/17/17 1500) Interventions: Coordination of Care;Referrals (06/17/17 1500) Referrals: Nutrition/dietician (06/17/17 1500) Coordination of Care: Appts (06/17/17 1500)       met with patient during follow up visit with Dr. Tasia Catchings to review biopsy results and discuss treatment options. All questions answered at the time of visit. Pt given materials regarding diagnosis as well as information about support services available. Pt asked questions regarding diet concerns and wanted recommendations to help lower his cholesterol. Informed pt that will refer him to dietitian, Desmond Lope, to further discuss diet recommendations. Reviewed upcoming appts with patient and informed that Dr. Genevive Bi office will contact him with appt. Advised that his follow up appt with Dr. Tasia Catchings will be scheduled after seeing Dr. Genevive Bi. Informed pt that we will notify him on when to return for follow up. Instructed pt to call with any further questions or needs. Pt verbalized understanding. Nothing further needed at this time.           Time Spent with Patient: 60 (06/17/17 1500)

## 2017-06-17 NOTE — Progress Notes (Signed)
Patient here for follow up

## 2017-06-20 ENCOUNTER — Other Ambulatory Visit: Payer: Self-pay

## 2017-06-20 DIAGNOSIS — N400 Enlarged prostate without lower urinary tract symptoms: Secondary | ICD-10-CM | POA: Insufficient documentation

## 2017-06-21 ENCOUNTER — Other Ambulatory Visit
Admission: RE | Admit: 2017-06-21 | Discharge: 2017-06-21 | Disposition: A | Discharge status: Home / Self Care | Payer: Medicare Other | Source: Ambulatory Visit | Attending: Cardiothoracic Surgery | Admitting: Cardiothoracic Surgery

## 2017-06-21 ENCOUNTER — Encounter: Payer: Self-pay | Admitting: Cardiothoracic Surgery

## 2017-06-21 ENCOUNTER — Ambulatory Visit: Payer: Medicare Other | Admitting: Cardiothoracic Surgery

## 2017-06-21 ENCOUNTER — Ambulatory Visit (HOSPITAL_COMMUNITY): Payer: Medicare Other

## 2017-06-21 ENCOUNTER — Ambulatory Visit: Payer: Medicare Other | Admitting: Oncology

## 2017-06-21 VITALS — BP 194/63 | HR 68 | Temp 97.7°F | Resp 20 | Ht 68.0 in | Wt 161.2 lb

## 2017-06-21 DIAGNOSIS — C3491 Malignant neoplasm of unspecified part of right bronchus or lung: Secondary | ICD-10-CM

## 2017-06-21 DIAGNOSIS — R911 Solitary pulmonary nodule: Secondary | ICD-10-CM

## 2017-06-21 LAB — BLOOD GAS, ARTERIAL
Acid-base deficit: 0.9 mmol/L (ref 0.0–2.0)
Bicarbonate: 24.3 mmol/L (ref 20.0–28.0)
FIO2: 0.21
O2 SAT: 96.1 %
PATIENT TEMPERATURE: 37
PO2 ART: 84 mmHg (ref 83.0–108.0)
pCO2 arterial: 41 mmHg (ref 32.0–48.0)
pH, Arterial: 7.38 (ref 7.350–7.450)

## 2017-06-21 MED ORDER — ALBUTEROL SULFATE (2.5 MG/3ML) 0.083% IN NEBU
2.5000 mg | INHALATION_SOLUTION | Freq: Once | RESPIRATORY_TRACT | Status: AC
  Administered 2017-06-21: 2.5 mg via RESPIRATORY_TRACT
  Filled 2017-06-21: qty 3

## 2017-06-21 NOTE — Progress Notes (Signed)
Patient ID: Marvin Williams, male   DOB: 1945-08-10, 72 y.o.   MRN: [id number]  Chief Complaint  Patient presents with  . New Patient (Initial Visit)    Squamous carcinoma Lung    Referred By Dr. Dennis Bast Reason for Referral RLL squamous cell carcinoma  HPI Location, Quality, Duration, Severity, Timing, Context, Modifying Factors, Associated Signs and Symptoms.  Marvin Williams is a 72 y.o. male.  He is a lifelong smoker having quit just a few days ago.  He states that he went to the emergency department for chronic cough as well as some neck and shoulder discomfort.  His neck and shoulder discomfort was evaluated with a chest x-ray as was his cough and this revealed a right lower lobe mass.  A subsequent CT scan demonstrated a 6 cm lobulated mass in the posterior aspect of the right lower lobe.  He was then referred to our oncology department where a PET scan and MRI of the brain were performed.  These were negative for metastatic disease.  A subsequent CT-guided needle biopsy confirmed the presence of squamous cell carcinoma.  The patient had some pulmonary function studies today.  His FEV1 and DLCO were approximately 60% of normal.  He states that his cough has improved some.  It was nonproductive.  He states that his neck pain is also improved.  He believes that he would like to get his Chantix prescription filled to help him with his tobacco cessation.  He was encouraged that he has made a 3 days without smoking.  He does not get short of breath with minimal activities.  He states though if he does do some heavy exertion he may get short of breath.  He also complains of some lower extremity pain in his calves with walking but he is able to walk on through that without limitations.  He does have an extensive family history of lung cancer in his mother and several siblings.  His occupation was as a Software engineer.  He has no known asbestos exposure.   Past Medical History:  Diagnosis Date  . Cancer [facility])    skin    Past Surgical History:  Procedure Laterality Date  . BACK SURGERY    . COLONOSCOPY      History reviewed. No pertinent family history.  Social History Social History   Tobacco Use  . Smoking status: Current Every Day Smoker  . Smokeless tobacco: Never Used  . Tobacco comment: Last cigarette on 05/22/17  Substance Use Topics  . Alcohol use: No    Frequency: Never  . Drug use: No    No Known Allergies  No current outpatient medications on file.   No current facility-administered medications for this visit.     He states that he has gained a little bit of weight since he tried to quit smoking.  He went from 1 pack of cigarettes a day down to now quit.  Review of Systems A complete review of systems was asked and was negative except for the following positive findings cough, joint pain, a feeling of enlarged glands in his neck.  Blood pressure (!) 194/63, pulse 68, temperature 97.7 F (36.5 C), temperature source Oral, resp. rate 20, height 5\' 8"  (1.727 m), weight 161 lb 3.2 oz (73.1 kg), SpO2 99 %.  Physical Exam CONSTITUTIONAL:  Pleasant, well-developed, well-nourished, and in no acute distress. EYES: Pupils equal and reactive to light, Sclera non-icteric EARS, NOSE, MOUTH AND THROAT:  The oropharynx was clear.  Dentition  is poor repair.  Oral mucosa pink and moist. LYMPH NODES:  Lymph nodes in the neck and axillae were normal RESPIRATORY:  Lungs were clear.  Normal respiratory effort without pathologic use of accessory muscles of respiration CARDIOVASCULAR: Heart was regular without murmurs.  There were no carotid bruits. GI: The abdomen was soft, nontender, and nondistended. There were no palpable masses. There was no hepatosplenomegaly. There were normal bowel sounds in all quadrants. GU:  Rectal deferred.   MUSCULOSKELETAL:  Normal muscle strength and tone.  No clubbing or cyanosis.   SKIN:  There were no pathologic skin lesions.  There were no nodules on  palpation. NEUROLOGIC:  Sensation is normal.  Cranial nerves are grossly intact. PSYCH:  Oriented to person, place and time.  Mood and affect are normal.  Data Reviewed CT scan and PET scan  I have personally reviewed the patient's imaging, laboratory findings and medical records.    Assessment    I have independently reviewed the patient's CT and PET.  There is a large right lower lobe mass abutting the posterior aspect of the right lower chest wall.  This extends from close to the vertebral bodies along the angle of the rib.  There are some calcified mediastinal nodes but nothing to suggest malignant adenopathy    Plan    We had a long discussion today regarding the patient's of risks of surgery including risks of chest wall resection as well as the possibility of performing postoperative radiation therapy or chemotherapy if the margins are close or positive along the chest wall.  I did explain to him that it would be my intention to try to remove all the tumor if possible.  I would like to discuss his CT findings with our neurosurgeons.  I told him to call me in 2 days.  I also reviewed with him the risks of surgery to include risks of bleeding, infection, air leak and death.  An overall 5% operative mortality was quoted.  He will contact my office to review the results of the consultation with our neurosurgeons.       Nestor Lewandowsky, MD 06/21/2017, 5:04 PM

## 2017-06-21 NOTE — Patient Instructions (Signed)
Please stop by the lab today.   Please call Dr.Oaks @ [phone] on Thursday.  We will be in touch as to scheduling an appointment with Dr.oaks.  Please call if you have questions or concerns.

## 2017-06-23 ENCOUNTER — Telehealth: Payer: Self-pay

## 2017-06-23 NOTE — Telephone Encounter (Signed)
Patient notified of Appointments below.  Dr.Chester Yarborough 07/07/17 @ 10 am @ [facility]  Dr.Oaks 07/15/17 @ 8:30 am

## 2017-06-27 ENCOUNTER — Telehealth: Payer: Self-pay | Admitting: *Deleted

## 2017-06-27 ENCOUNTER — Inpatient Hospital Stay: Payer: Medicare Other

## 2017-06-27 DIAGNOSIS — C3490 Malignant neoplasm of unspecified part of unspecified bronchus or lung: Secondary | ICD-10-CM

## 2017-06-27 NOTE — Telephone Encounter (Signed)
Pt informed of scheduled appt with Dr. Baruch Gouty on 2/5 at [facility]. Pt states would like to coordinate follow up with Dr. Tasia Catchings when radiation treatments are scheduled. Nothing further needed at this time.

## 2017-06-27 NOTE — Telephone Encounter (Signed)
-----   Message from Earlie Server, MD sent at 06/24/2017  4:43 PM EST ----- Agree! Thank you for the update.  I can see him after Dr.Chrystal's consult.  ----- Message ----- From: Telford Nab, RN Sent: 06/24/2017   4:29 PM To: Earlie Server, MD  Pt has consulted with Dr. Genevive Bi and has decided that he does not want to pursue surgery for his lung cancer. I informed him that radiation would be the next best treatment for his lung cancer. He was agreeable to that idea and wanted to set up an appt to see Dr. Baruch Gouty for further discussion.   Is this what you would recommend? Also, do you want to see him when he comes in to see Dr. Baruch Gouty?  Let me know when you get a chance and I can coordinate appts.   Thanks, [employer]

## 2017-07-01 ENCOUNTER — Ambulatory Visit
Admission: RE | Admit: 2017-07-01 | Discharge: 2017-07-01 | Disposition: A | Discharge status: Home / Self Care | Payer: Medicare Other | Source: Ambulatory Visit | Attending: Oncology | Admitting: Oncology

## 2017-07-01 DIAGNOSIS — E042 Nontoxic multinodular goiter: Secondary | ICD-10-CM | POA: Insufficient documentation

## 2017-07-01 DIAGNOSIS — C3491 Malignant neoplasm of unspecified part of right bronchus or lung: Secondary | ICD-10-CM | POA: Diagnosis not present

## 2017-07-05 ENCOUNTER — Encounter: Payer: Self-pay | Admitting: *Deleted

## 2017-07-05 ENCOUNTER — Inpatient Hospital Stay: Payer: Medicare Other | Attending: Oncology | Admitting: Oncology

## 2017-07-05 ENCOUNTER — Encounter: Payer: Self-pay | Admitting: Radiation Oncology

## 2017-07-05 ENCOUNTER — Other Ambulatory Visit: Payer: Self-pay

## 2017-07-05 ENCOUNTER — Inpatient Hospital Stay: Payer: Medicare Other

## 2017-07-05 ENCOUNTER — Encounter: Payer: Self-pay | Admitting: Oncology

## 2017-07-05 ENCOUNTER — Ambulatory Visit
Admission: RE | Admit: 2017-07-05 | Discharge: 2017-07-05 | Disposition: A | Discharge status: Home / Self Care | Payer: Medicare Other | Source: Ambulatory Visit | Attending: Radiation Oncology | Admitting: Radiation Oncology

## 2017-07-05 VITALS — BP 167/89 | HR 68 | Temp 98.0°F | Resp 16 | Ht 68.0 in | Wt 159.2 lb

## 2017-07-05 VITALS — BP 167/89 | HR 68 | Temp 98.0°F | Wt 158.1 lb

## 2017-07-05 DIAGNOSIS — C3491 Malignant neoplasm of unspecified part of right bronchus or lung: Secondary | ICD-10-CM

## 2017-07-05 DIAGNOSIS — F17210 Nicotine dependence, cigarettes, uncomplicated: Secondary | ICD-10-CM | POA: Diagnosis not present

## 2017-07-05 DIAGNOSIS — C3431 Malignant neoplasm of lower lobe, right bronchus or lung: Secondary | ICD-10-CM | POA: Diagnosis not present

## 2017-07-05 DIAGNOSIS — R030 Elevated blood-pressure reading, without diagnosis of hypertension: Secondary | ICD-10-CM | POA: Diagnosis not present

## 2017-07-05 DIAGNOSIS — C3401 Malignant neoplasm of right main bronchus: Secondary | ICD-10-CM

## 2017-07-05 DIAGNOSIS — C3490 Malignant neoplasm of unspecified part of unspecified bronchus or lung: Secondary | ICD-10-CM

## 2017-07-05 DIAGNOSIS — E041 Nontoxic single thyroid nodule: Secondary | ICD-10-CM

## 2017-07-05 DIAGNOSIS — Z85828 Personal history of other malignant neoplasm of skin: Secondary | ICD-10-CM

## 2017-07-05 LAB — TSH: TSH: 1.462 u[IU]/mL (ref 0.350–4.500)

## 2017-07-05 NOTE — Consult Note (Signed)
NEW PATIENT EVALUATION  Name: Marvin Williams  MRN: [id number]  Date:   07/05/2017     DOB: [birth year]   This 72 y.o. male patient presents to the clinic for initial evaluation of stage IIB (T3 N0 M0) squamous cell carcinoma of the right lower lobe.  REFERRING PHYSICIAN: Derinda Late, MD  CHIEF COMPLAINT:  Chief Complaint  Patient presents with  . Lung Cancer    DIAGNOSIS: The encounter diagnosis was Squamous carcinoma of lung, right ([place]).   PREVIOUS INVESTIGATIONS:  PET CT and CT scans reviewed Clinical notes reviewed Pathology reports reviewed  HPI: Patient is a 73 year old male who presented to the emergency room with increasing cough and neck and shoulder discomfort. Chest x-ray showed a right lower lobe mass. CT scan showed a 6 cm lobular mass in the posterior aspect of the right lower lobe. PET scan showed hypermetabolic activity consistent with primary bronchogenic carcinoma. Incidentally he did have a right thyroid lobe nodule with thyroid cancer cannot be excluded. Patient underwent CT-guided biopsy which was positive for squamous cell carcinoma. No evidence of mediastinal adenopathy was identified. MRI of his brain showed no evidence to suggest an metastatic disease. Patient's pulmonary functions showed FEV1 and DLCO approximate 60% of normal. He has been seen by thoracic surgeon. Tumor does appear to be involving the chest wall and surgical options were discussed with the patient and his family and he has declined surgery. He is now referred to radiation oncology for consideration of treatment.  PLANNED TREATMENT REGIMEN: Hypofractionated I MRT radiation therapy  PAST MEDICAL HISTORY:  has a past medical history of Cancer ([place]).    PAST SURGICAL HISTORY:  Past Surgical History:  Procedure Laterality Date  . BACK SURGERY    . COLONOSCOPY      FAMILY HISTORY: family history is not on file.  SOCIAL HISTORY:  reports that he has been smoking.  he has never used  smokeless tobacco. He reports that he does not drink alcohol or use drugs.  ALLERGIES: Patient has no known allergies.  MEDICATIONS:  No current outpatient medications on file.   No current facility-administered medications for this encounter.     ECOG PERFORMANCE STATUS:  0 - Asymptomatic  REVIEW OF SYSTEMS:  Patient denies any weight loss, fatigue, weakness, fever, chills or night sweats. Patient denies any loss of vision, blurred vision. Patient denies any ringing  of the ears or hearing loss. No irregular heartbeat. Patient denies heart murmur or history of fainting. Patient denies any chest pain or pain radiating to her upper extremities. Patient denies any shortness of breath, difficulty breathing at night, cough or hemoptysis. Patient denies any swelling in the lower legs. Patient denies any nausea vomiting, vomiting of blood, or coffee ground material in the vomitus. Patient denies any stomach pain. Patient states has had normal bowel movements no significant constipation or diarrhea. Patient denies any dysuria, hematuria or significant nocturia. Patient denies any problems walking, swelling in the joints or loss of balance. Patient denies any skin changes, loss of hair or loss of weight. Patient denies any excessive worrying or anxiety or significant depression. Patient denies any problems with insomnia. Patient denies excessive thirst, polyuria, polydipsia. Patient denies any swollen glands, patient denies easy bruising or easy bleeding. Patient denies any recent infections, allergies or URI. Patient "s visual fields have not changed significantly in recent time.    PHYSICAL EXAM: BP (!) 167/89 (BP Location: Left Arm, Patient Position: Sitting, Cuff Size: Small)   Pulse 68  Temp 98 F (36.7 C) (Tympanic)   Resp 16   Ht 5\' 8"  (1.727 m)   Wt 159 lb 2.8 oz (72.2 kg)   SpO2 98%   BMI 24.20 kg/m  Well-developed well-nourished patient in NAD. HEENT reveals PERLA, EOMI, discs not  visualized.  Oral cavity is clear. No oral mucosal lesions are identified. Neck is clear without evidence of cervical or supraclavicular adenopathy. Lungs are clear to A&P. Cardiac examination is essentially unremarkable with regular rate and rhythm without murmur rub or thrill. Abdomen is benign with no organomegaly or masses noted. Motor sensory and DTR levels are equal and symmetric in the upper and lower extremities. Cranial nerves II through XII are grossly intact. Proprioception is intact. No peripheral adenopathy or edema is identified. No motor or sensory levels are noted. Crude visual fields are within normal range.  LABORATORY DATA: Pathology reports reviewed    RADIOLOGY RESULTS: PET CT CT scans and MRI of brain all reviewed   IMPRESSION: Stage IIB (T3 N0 M0) squamous cell carcinoma right lower lobe in 72 year old male  PLAN: At this time I would recommend a hypofractionated course of radiation therapy to his right lower lobe squamous cell carcinoma. Would plan on delivering 7000 cGy in 10 fractions. I would use I MRT radiation therapy treatment planning and delivery based on the hypofractionated course of treatment and risk to local structures such as esophagus spinal cord and chest wall. Risks and benefits of treatment including alteration of blood counts fatigue development of cough possible chest wall pain all were discussed in detail. I will use 4D as well as PET CT fusion study and abdominal compression during simulation. I personally ordered and scheduled CT simulation for later this week. Patient and family seem to comprehend my treatment plan well. I have personally discussed the case with medical oncology. Based on the to be nature of his disease do not see a role for systemic chemotherapy.  I would like to take this opportunity to thank you for allowing me to participate in the care of your patient.Noreene Filbert, MD

## 2017-07-05 NOTE — Progress Notes (Signed)
Hematology/Oncology Follow up note [facility] Telephone:([phone]) [phone] Fax:([phone]) [phone]   Patient Care Team: Derinda Late, MD as PCP - General (Family Medicine) Telford Nab, RN as Registered Nurse  . REASON FOR VISIT Follow up for treatment of squamous lung cancer HISTORY OF PRESENTING ILLNESS:  Marvin Williams is a  72 y.o.  male follows up for management of squamous lung cancer, stage IIB (cT3,cN0 cM0) His case was discussed on tumor board.  Although he has mild asymmetric FDG activity iin the right hilum with SUV max of 2.9. This corresponds to a densely calcified hilar lymph node, consensus was reached that this is consistent with  old granulomatous disease. He is referred to Dr.Oaks to discuss about surgery.   #Ultrasound thyroid was obtained which showed ill-defined right inferior lobe nodule in the thyroid, 1.7 cm by 1.1 x 1.1 cm.  Given that the size is more than 1.5 cm in appearance, fine-needle aspiration of this suspicious nodule is recommended based on TI -Rads criteria.  INTERVAL HISTORY Marvin Williams is a 72 y.o. male who has above history reviewed by me today presents for follow up visit for management of newly diagnosed lung cancer.  He was seen by Dr. Genevive Bi and discuss about surgical options.  Patient declines surgery. He has been seen by Dr. Donella Stade for discussion about curative intent with SBRT Patient has quit smoking for the past 3 weeks, denies any shortness of breath, cough, pain.  He has neck muscle stiffness/pain for which she takes ibuprofen as needed.  Review of Systems  Constitutional: Negative for chills, diaphoresis and malaise/fatigue.  HENT: Negative for ear discharge and hearing loss.   Eyes: Negative for double vision and photophobia.  Respiratory: Negative for cough and hemoptysis.   Cardiovascular: Negative for chest pain, orthopnea and claudication.  Gastrointestinal: Negative for abdominal pain, constipation, heartburn,  nausea and vomiting.  Genitourinary: Negative for dysuria, hematuria and urgency.  Musculoskeletal: Negative for back pain, myalgias and neck pain.       Neck muscle tightness  Skin: Negative for itching and rash.  Neurological: Negative for dizziness, tingling, weakness and headaches.  Endo/Heme/Allergies: Does not bruise/bleed easily.  Psychiatric/Behavioral: Negative for depression and hallucinations.    MEDICAL HISTORY:  Past Medical History:  Diagnosis Date  . Cancer [facility])    skin    SURGICAL HISTORY: Past Surgical History:  Procedure Laterality Date  . BACK SURGERY    . COLONOSCOPY      SOCIAL HISTORY: Social History   Socioeconomic History  . Marital status: Married    Spouse name: Not on file  . Number of children: Not on file  . Years of education: Not on file  . Highest education level: Not on file  Social Needs  . Financial resource strain: Not on file  . Food insecurity - worry: Not on file  . Food insecurity - inability: Not on file  . Transportation needs - medical: Not on file  . Transportation needs - non-medical: Not on file  Occupational History  . Not on file  Tobacco Use  . Smoking status: Current Every Day Smoker  . Smokeless tobacco: Never Used  . Tobacco comment: Last cigarette on 06/04/17  Substance and Sexual Activity  . Alcohol use: No    Frequency: Never  . Drug use: No  . Sexual activity: Not on file  Other Topics Concern  . Not on file  Social History Narrative  . Not on file    FAMILY HISTORY:  History reviewed. No pertinent family history.  ALLERGIES:  has No Known Allergies.  MEDICATIONS:  No current outpatient medications on file.   No current facility-administered medications for this visit.      PHYSICAL EXAMINATION: ECOG PERFORMANCE STATUS: 0 - Asymptomatic Vitals:   07/05/17 1100  BP: (!) 167/89  Pulse: 68  Temp: 98 F (36.7 C)  SpO2: 98%   Filed Weights   07/05/17 1100  Weight: 158 lb 1 oz (71.7 kg)     Physical Exam  Constitutional: He is oriented to person, place, and time and well-developed, well-nourished, and in no distress. No distress.  HENT:  Head: Normocephalic and atraumatic.  Mouth/Throat: Oropharynx is clear and moist.  Eyes: Conjunctivae and EOM are normal. Pupils are equal, round, and reactive to light. Left eye exhibits no discharge. No scleral icterus.  Neck: Normal range of motion. Neck supple. No JVD present.  Cardiovascular: Normal rate, regular rhythm and normal heart sounds. Exam reveals no friction rub.  No murmur heard. Pulmonary/Chest: Effort normal and breath sounds normal. He has no wheezes. He has no rales.  Decreased breath sounds bilaterally.  Abdominal: Soft. Bowel sounds are normal. He exhibits no distension. There is no tenderness.  Musculoskeletal: Normal range of motion. He exhibits no edema.  Lymphadenopathy:    He has no cervical adenopathy.  Neurological: He is alert and oriented to person, place, and time. Coordination normal.  Skin: Skin is warm and dry. No erythema.  Psychiatric: Affect normal.     LABORATORY DATA:  I have reviewed the data as listed Lab Results  Component Value Date   WBC 10.2 06/13/2017   HGB 15.1 06/13/2017   HCT 45.6 06/13/2017   MCV 90.4 06/13/2017   PLT 316 06/13/2017   Recent Labs    05/22/17 0940  NA 135  K 4.8  CL 102  CO2 25  GLUCOSE 110*  BUN 17  CREATININE 1.13  CALCIUM 9.4  GFRNONAA >60  GFRAA >60       ASSESSMENT & PLAN:  Cancer Staging Squamous carcinoma of lung, right ([place]) Staging form: Lung, AJCC 8th Edition - Clinical stage from 06/16/2017: Stage IIB (cT3, cN0, cM0) - Signed by Earlie Server, MD on 06/16/2017  1. Thyroid nodule   2. Malignant neoplasm of lung, unspecified laterality, unspecified part of lung ([place])   3. Squamous carcinoma of lung, right [facility])    #Discussed with patient that since he declines surgery option, the next best will be radiation.  Discussed with Dr. Donella Stade,  will proceed with SBRT, curative intent.  #Thyroid nodule: Check TSH today.  Plan ultrasound guided biopsy of thyroid nodule in the near future, after patient finish radiation treatment.  # Elevated blood pressure readings can be secondary to anxiety due to the newly diagnosed cancer.Blood pressure today in the office has significantly improved comparing last week however still elevated.  Continue monitor.  Advised patient to follow-up with primary care physician.  The patient knows to call the clinic with any problems questions or concerns.  Return of visit: 3 weeks to discuss about possible biopsy of thyroid nodule. Earlie Server, MD, PhD Hematology Oncology [facility] Pager- [phone] 07/05/2017

## 2017-07-05 NOTE — Progress Notes (Signed)
  Oncology Nurse Navigator Documentation  Navigator Location: [facility] (07/05/17 1300)   )Navigator Encounter Type: Initial RadOnc (07/05/17 1300)                       Treatment Phase: Pre-Tx/Tx Discussion (07/05/17 1300) Barriers/Navigation Needs: Coordination of Care (07/05/17 1300)   Interventions: Coordination of Care (07/05/17 1300)   Coordination of Care: Appts (07/05/17 1300)       met with patient during initial radiation oncology consultation with Dr. Baruch Gouty today. All questions answered at the time of visit. Reviewed upcoming appts with patient. Pt requested assistance with cancelling his appts with neurosurgery and thoracic surgery. Spoke with [place] to cancel neurosurgery appt. Message sent to Dr. Genevive Bi staff to cancel his appt with Dr. Genevive Bi on 2/15. Pt has been informed of this. Pt does not have any further questions or needs. Informed pt to call if needs anything. Pt verbalized understanding. Nothing further needed at this time.           Time Spent with Patient: 60 (07/05/17 1300)

## 2017-07-07 ENCOUNTER — Ambulatory Visit
Admission: RE | Admit: 2017-07-07 | Discharge: 2017-07-07 | Disposition: A | Discharge status: Home / Self Care | Payer: Medicare Other | Source: Ambulatory Visit | Attending: Radiation Oncology | Admitting: Radiation Oncology

## 2017-07-07 DIAGNOSIS — C3431 Malignant neoplasm of lower lobe, right bronchus or lung: Secondary | ICD-10-CM | POA: Insufficient documentation

## 2017-07-07 DIAGNOSIS — Z510 Encounter for antineoplastic radiation therapy: Secondary | ICD-10-CM | POA: Diagnosis not present

## 2017-07-11 ENCOUNTER — Telehealth: Payer: Self-pay

## 2017-07-11 NOTE — Telephone Encounter (Signed)
Spoke with patient at this time. He did not keep his appointment with Dr.Yarborough. He has decided to start with radiation treatments with Dr.Crystal and will call Dr.Oaks if needed in the future. He recently had an appointment with Dr.Crystal 07/05/17.

## 2017-07-12 DIAGNOSIS — C3431 Malignant neoplasm of lower lobe, right bronchus or lung: Secondary | ICD-10-CM | POA: Diagnosis not present

## 2017-07-12 DIAGNOSIS — Z510 Encounter for antineoplastic radiation therapy: Secondary | ICD-10-CM | POA: Diagnosis not present

## 2017-07-15 ENCOUNTER — Ambulatory Visit: Payer: Medicare Other | Admitting: Cardiothoracic Surgery

## 2017-07-19 ENCOUNTER — Ambulatory Visit
Admission: RE | Admit: 2017-07-19 | Discharge: 2017-07-19 | Disposition: A | Discharge status: Home / Self Care | Payer: Medicare Other | Source: Ambulatory Visit | Attending: Radiation Oncology | Admitting: Radiation Oncology

## 2017-07-19 DIAGNOSIS — C3431 Malignant neoplasm of lower lobe, right bronchus or lung: Secondary | ICD-10-CM | POA: Diagnosis not present

## 2017-07-19 DIAGNOSIS — Z510 Encounter for antineoplastic radiation therapy: Secondary | ICD-10-CM | POA: Diagnosis not present

## 2017-07-20 ENCOUNTER — Ambulatory Visit
Admission: RE | Admit: 2017-07-20 | Discharge: 2017-07-20 | Disposition: A | Discharge status: Home / Self Care | Payer: Medicare Other | Source: Ambulatory Visit | Attending: Radiation Oncology | Admitting: Radiation Oncology

## 2017-07-20 DIAGNOSIS — Z510 Encounter for antineoplastic radiation therapy: Secondary | ICD-10-CM | POA: Diagnosis not present

## 2017-07-20 DIAGNOSIS — C3431 Malignant neoplasm of lower lobe, right bronchus or lung: Secondary | ICD-10-CM | POA: Diagnosis not present

## 2017-07-21 ENCOUNTER — Ambulatory Visit
Admission: RE | Admit: 2017-07-21 | Discharge: 2017-07-21 | Disposition: A | Discharge status: Home / Self Care | Payer: Medicare Other | Source: Ambulatory Visit | Attending: Radiation Oncology | Admitting: Radiation Oncology

## 2017-07-21 DIAGNOSIS — C3431 Malignant neoplasm of lower lobe, right bronchus or lung: Secondary | ICD-10-CM | POA: Diagnosis not present

## 2017-07-21 DIAGNOSIS — Z510 Encounter for antineoplastic radiation therapy: Secondary | ICD-10-CM | POA: Diagnosis not present

## 2017-07-22 ENCOUNTER — Ambulatory Visit
Admission: RE | Admit: 2017-07-22 | Discharge: 2017-07-22 | Disposition: A | Discharge status: Home / Self Care | Payer: Medicare Other | Source: Ambulatory Visit | Attending: Radiation Oncology | Admitting: Radiation Oncology

## 2017-07-22 DIAGNOSIS — Z510 Encounter for antineoplastic radiation therapy: Secondary | ICD-10-CM | POA: Diagnosis not present

## 2017-07-22 DIAGNOSIS — C3431 Malignant neoplasm of lower lobe, right bronchus or lung: Secondary | ICD-10-CM | POA: Diagnosis not present

## 2017-07-25 ENCOUNTER — Ambulatory Visit
Admission: RE | Admit: 2017-07-25 | Discharge: 2017-07-25 | Disposition: A | Discharge status: Home / Self Care | Payer: Medicare Other | Source: Ambulatory Visit | Attending: Radiation Oncology | Admitting: Radiation Oncology

## 2017-07-25 DIAGNOSIS — C3431 Malignant neoplasm of lower lobe, right bronchus or lung: Secondary | ICD-10-CM | POA: Diagnosis not present

## 2017-07-25 DIAGNOSIS — Z510 Encounter for antineoplastic radiation therapy: Secondary | ICD-10-CM | POA: Diagnosis not present

## 2017-07-26 ENCOUNTER — Encounter: Payer: Self-pay | Admitting: Oncology

## 2017-07-26 ENCOUNTER — Ambulatory Visit
Admission: RE | Admit: 2017-07-26 | Discharge: 2017-07-26 | Disposition: A | Discharge status: Home / Self Care | Payer: Medicare Other | Source: Ambulatory Visit | Attending: Radiation Oncology | Admitting: Radiation Oncology

## 2017-07-26 ENCOUNTER — Inpatient Hospital Stay: Payer: Medicare Other

## 2017-07-26 ENCOUNTER — Inpatient Hospital Stay (HOSPITAL_BASED_OUTPATIENT_CLINIC_OR_DEPARTMENT_OTHER): Payer: Medicare Other | Admitting: Oncology

## 2017-07-26 VITALS — BP 153/89 | HR 66 | Temp 96.4°F | Ht 68.0 in | Wt 161.0 lb

## 2017-07-26 DIAGNOSIS — C3491 Malignant neoplasm of unspecified part of right bronchus or lung: Secondary | ICD-10-CM

## 2017-07-26 DIAGNOSIS — C3401 Malignant neoplasm of right main bronchus: Secondary | ICD-10-CM

## 2017-07-26 DIAGNOSIS — F17210 Nicotine dependence, cigarettes, uncomplicated: Secondary | ICD-10-CM

## 2017-07-26 DIAGNOSIS — Z85828 Personal history of other malignant neoplasm of skin: Secondary | ICD-10-CM | POA: Diagnosis not present

## 2017-07-26 DIAGNOSIS — C3490 Malignant neoplasm of unspecified part of unspecified bronchus or lung: Secondary | ICD-10-CM

## 2017-07-26 DIAGNOSIS — C3431 Malignant neoplasm of lower lobe, right bronchus or lung: Secondary | ICD-10-CM | POA: Diagnosis not present

## 2017-07-26 DIAGNOSIS — R030 Elevated blood-pressure reading, without diagnosis of hypertension: Secondary | ICD-10-CM

## 2017-07-26 DIAGNOSIS — E041 Nontoxic single thyroid nodule: Secondary | ICD-10-CM

## 2017-07-26 DIAGNOSIS — Z510 Encounter for antineoplastic radiation therapy: Secondary | ICD-10-CM | POA: Diagnosis not present

## 2017-07-26 LAB — CBC WITH DIFFERENTIAL/PLATELET
Basophils Absolute: 0.1 10*3/uL (ref 0–0.1)
Basophils Relative: 1 %
Eosinophils Absolute: 0.6 10*3/uL (ref 0–0.7)
Eosinophils Relative: 9 %
HEMATOCRIT: 43.2 % (ref 40.0–52.0)
HEMOGLOBIN: 14.6 g/dL (ref 13.0–18.0)
LYMPHS PCT: 15 %
Lymphs Abs: 1.1 10*3/uL (ref 1.0–3.6)
MCH: 30.1 pg (ref 26.0–34.0)
MCHC: 33.8 g/dL (ref 32.0–36.0)
MCV: 89 fL (ref 80.0–100.0)
MONOS PCT: 11 %
Monocytes Absolute: 0.8 10*3/uL (ref 0.2–1.0)
NEUTROS ABS: 4.5 10*3/uL (ref 1.4–6.5)
NEUTROS PCT: 64 %
Platelets: 359 10*3/uL (ref 150–440)
RBC: 4.85 MIL/uL (ref 4.40–5.90)
RDW: 14.1 % (ref 11.5–14.5)
WBC: 7.1 10*3/uL (ref 3.8–10.6)

## 2017-07-26 LAB — COMPREHENSIVE METABOLIC PANEL
ALBUMIN: 3.9 g/dL (ref 3.5–5.0)
ALK PHOS: 62 U/L (ref 38–126)
ALT: 10 U/L — ABNORMAL LOW (ref 17–63)
ANION GAP: 8 (ref 5–15)
AST: 15 U/L (ref 15–41)
BILIRUBIN TOTAL: 0.5 mg/dL (ref 0.3–1.2)
BUN: 17 mg/dL (ref 6–20)
CALCIUM: 9.6 mg/dL (ref 8.9–10.3)
CO2: 28 mmol/L (ref 22–32)
Chloride: 103 mmol/L (ref 101–111)
Creatinine, Ser: 1.16 mg/dL (ref 0.61–1.24)
GFR calc Af Amer: >60 mL/min (ref >60)
GLUCOSE: 100 mg/dL — AB (ref 65–99)
Potassium: 4.8 mmol/L (ref 3.5–5.1)
Sodium: 139 mmol/L (ref 135–145)
Total Protein: 7.7 g/dL (ref 6.5–8.1)

## 2017-07-26 NOTE — Progress Notes (Signed)
No new changes noted today 

## 2017-07-26 NOTE — Progress Notes (Signed)
Hematology/Oncology Follow up note [facility] [phone] [phone]) [phone]   Patient Care Team: Derinda Late, MD as PCP - General (Family Medicine) Telford Nab, RN as Registered Nurse  . REASON FOR VISIT Follow up for treatment of squamous lung cancer HISTORY OF PRESENTING ILLNESS:  Marvin Williams is a  72 y.o.  male follows up for management of squamous lung cancer, stage IIB (cT3,cN0 cM0) His case was discussed on tumor board.  Although he has mild asymmetric FDG activity iin the right hilum with SUV max of 2.9. This corresponds to a densely calcified hilar lymph node, consensus was reached that this is consistent with  old granulomatous disease. He is referred to Dr.Oaks to discuss about surgery.   #Ultrasound thyroid was obtained which showed ill-defined right inferior lobe nodule in the thyroid, 1.7 cm by 1.1 x 1.1 cm.  Given that the size is more than 1.5 cm in appearance, fine-needle aspiration of this suspicious nodule is recommended based on TI -Rads criteria.  INTERVAL HISTORY Marvin Williams is a 72 y.o. male who has above history reviewed by me today presents for follow up visit for management of newly diagnosed lung cancer.  He is getting Radiation treatment, about to finish next week. Doing well. Denies any heart burn, chest pain, abdominal pain.   Review of Systems  Constitutional: Negative for chills, diaphoresis and malaise/fatigue.  HENT: Negative for ear discharge, ear pain and hearing loss.   Eyes: Negative for double vision and photophobia.  Respiratory: Negative for cough and hemoptysis.   Cardiovascular: Negative for chest pain, orthopnea and claudication.  Gastrointestinal: Negative for abdominal pain, constipation, heartburn, nausea and vomiting.  Genitourinary: Negative for dysuria, hematuria and urgency.  Musculoskeletal: Negative for back pain, myalgias and neck pain.  Skin: Negative for itching and rash.    Neurological: Negative for dizziness, tingling, weakness and headaches.  Endo/Heme/Allergies: Does not bruise/bleed easily.  Psychiatric/Behavioral: Negative for depression and hallucinations.    MEDICAL HISTORY:  Past Medical History:  Diagnosis Date  . [facility])    skin    SURGICAL HISTORY: Past Surgical History:  Procedure Laterality Date  . BACK SURGERY    . COLONOSCOPY      SOCIAL HISTORY: Social History   Socioeconomic History  . Marital status: Married    Spouse name: Not on file  . Number of children: Not on file  . Years of education: Not on file  . Highest education level: Not on file  Social Needs  . Financial resource strain: Not on file  . Food insecurity - worry: Not on file  . Food insecurity - inability: Not on file  . Transportation needs - medical: Not on file  . Transportation needs - non-medical: Not on file  Occupational History  . Not on file  Tobacco Use  . Smoking status: Current Every Day Smoker  . Smokeless tobacco: Never Used  . Tobacco comment: Last cigarette on 06/04/17  Substance and Sexual Activity  . Alcohol use: No    Frequency: Never  . Drug use: No  . Sexual activity: Not on file  Other Topics Concern  . Not on file  Social History Narrative  . Not on file    FAMILY HISTORY: History reviewed. No pertinent family history.  ALLERGIES:  has No Known Allergies.  MEDICATIONS:  No current outpatient medications on file.   No current facility-administered medications for this visit.      PHYSICAL EXAMINATION: ECOG PERFORMANCE STATUS: 0 -  Asymptomatic Vitals:   07/26/17 1012  BP: (!) 153/89  Pulse: 66  Temp: (!) 96.4 F (35.8 C)  SpO2: 99%   Filed Weights   07/26/17 1012  Weight: 161 lb (73 kg)    Physical Exam  Constitutional: He is oriented to person, place, and time and well-developed, well-nourished, and in no distress. No distress.  HENT:  Head: Normocephalic and atraumatic.  Mouth/Throat: Oropharynx  is clear and moist.  Eyes: Conjunctivae and EOM are normal. Pupils are equal, round, and reactive to light. Left eye exhibits no discharge. No scleral icterus.  Neck: Normal range of motion. Neck supple. No JVD present.  Cardiovascular: Normal rate, regular rhythm and normal heart sounds. Exam reveals no friction rub.  No murmur heard. Pulmonary/Chest: Effort normal and breath sounds normal. He has no wheezes. He has no rales.  Decreased breath sounds bilaterally.  Abdominal: Soft. Bowel sounds are normal. He exhibits no distension. There is no tenderness.  Musculoskeletal: Normal range of motion. He exhibits no edema.  Lymphadenopathy:    He has no cervical adenopathy.  Neurological: He is alert and oriented to person, place, and time. Coordination normal.  Skin: Skin is warm and dry. No erythema.  Psychiatric: Affect normal.     LABORATORY DATA:  I have reviewed the data as listed Lab Results  Component Value Date   WBC 7.1 07/26/2017   HGB 14.6 07/26/2017   HCT 43.2 07/26/2017   MCV 89.0 07/26/2017   PLT 359 07/26/2017   Recent Labs    05/22/17 0940 07/26/17 0940  NA 135 139  K 4.8 4.8  CL 102 103  CO2 25 28  GLUCOSE 110* 100*  BUN 17 17  CREATININE 1.13 1.16  CALCIUM 9.4 9.6  GFRNONAA >60 >60  GFRAA >60 >60  PROT  --  7.7  ALBUMIN  --  3.9  AST  --  15  ALT  --  10*  ALKPHOS  --  62  BILITOT  --  0.5       ASSESSMENT & PLAN:  Cancer Staging Squamous carcinoma of lung, right ([place]) Staging form: Lung, AJCC 8th Edition - Clinical stage from 06/16/2017: Stage IIB (cT3, cN0, cM0) - Signed by Earlie Server, MD on 06/16/2017  1. Squamous carcinoma of lung, right ([place])   2. Thyroid nodule    #Continue follow up with radiation. [name]  #Thyroid nodule: normal TSH today.  Plan ultrasound guided biopsy of thyroid nodule in  3 weeks (he should have finished RT by then).  # Elevated blood pressure   Advised patient to follow-up with primary care physician.  The patient  knows to call the clinic with any problems questions or concerns.  Return of visit: 1 week after biopsy. Earlie Server, MD, PhD Hematology Oncology [facility] at [facility] Pager- [phone] 07/26/2017

## 2017-07-27 ENCOUNTER — Ambulatory Visit
Admission: RE | Admit: 2017-07-27 | Discharge: 2017-07-27 | Disposition: A | Discharge status: Home / Self Care | Payer: Medicare Other | Source: Ambulatory Visit | Attending: Radiation Oncology | Admitting: Radiation Oncology

## 2017-07-27 DIAGNOSIS — C3431 Malignant neoplasm of lower lobe, right bronchus or lung: Secondary | ICD-10-CM | POA: Diagnosis not present

## 2017-07-27 DIAGNOSIS — Z510 Encounter for antineoplastic radiation therapy: Secondary | ICD-10-CM | POA: Diagnosis not present

## 2017-07-28 ENCOUNTER — Ambulatory Visit
Admission: RE | Admit: 2017-07-28 | Discharge: 2017-07-28 | Disposition: A | Discharge status: Home / Self Care | Payer: Medicare Other | Source: Ambulatory Visit | Attending: Radiation Oncology | Admitting: Radiation Oncology

## 2017-07-28 DIAGNOSIS — C3431 Malignant neoplasm of lower lobe, right bronchus or lung: Secondary | ICD-10-CM | POA: Diagnosis not present

## 2017-07-28 DIAGNOSIS — Z510 Encounter for antineoplastic radiation therapy: Secondary | ICD-10-CM | POA: Diagnosis not present

## 2017-07-29 ENCOUNTER — Ambulatory Visit
Admission: RE | Admit: 2017-07-29 | Discharge: 2017-07-29 | Disposition: A | Discharge status: Home / Self Care | Payer: Medicare Other | Source: Ambulatory Visit | Attending: Radiation Oncology | Admitting: Radiation Oncology

## 2017-07-29 DIAGNOSIS — Z510 Encounter for antineoplastic radiation therapy: Secondary | ICD-10-CM | POA: Insufficient documentation

## 2017-07-29 DIAGNOSIS — C3431 Malignant neoplasm of lower lobe, right bronchus or lung: Secondary | ICD-10-CM | POA: Diagnosis not present

## 2017-08-01 ENCOUNTER — Ambulatory Visit
Admission: RE | Admit: 2017-08-01 | Discharge: 2017-08-01 | Disposition: A | Discharge status: Home / Self Care | Payer: Medicare Other | Source: Ambulatory Visit | Attending: Radiation Oncology | Admitting: Radiation Oncology

## 2017-08-01 DIAGNOSIS — C3431 Malignant neoplasm of lower lobe, right bronchus or lung: Secondary | ICD-10-CM | POA: Diagnosis not present

## 2017-08-01 DIAGNOSIS — Z510 Encounter for antineoplastic radiation therapy: Secondary | ICD-10-CM | POA: Diagnosis not present

## 2017-08-02 ENCOUNTER — Ambulatory Visit
Admission: RE | Admit: 2017-08-02 | Discharge: 2017-08-02 | Disposition: A | Discharge status: Home / Self Care | Payer: Medicare Other | Source: Ambulatory Visit | Attending: Oncology | Admitting: Oncology

## 2017-08-02 DIAGNOSIS — E041 Nontoxic single thyroid nodule: Secondary | ICD-10-CM

## 2017-08-02 NOTE — Discharge Instructions (Signed)
Thyroid Biopsy The thyroid gland is a butterfly-shaped gland located in the front of the neck. It produces hormones that affect metabolism, growth and development, and body temperature. Thyroid biopsy is a procedure in which small samples of tissue or fluid are removed from the thyroid gland. The samples are then looked at under a microscope to check for abnormalities. This procedure is done to determine the cause of thyroid problems. It may be done to check for infection, cancer, or other thyroid problems. Two methods may be used for a thyroid biopsy. In one method, a thin needle is inserted through the skin and into the thyroid gland. In the other method, an open incision is made through the skin. Tell a health care provider about:  Any allergies you have.  All medicines you are taking, including vitamins, herbs, eye drops, creams, and over-the-counter medicines.  Any problems you or family members have had with anesthetic medicines.  Any blood disorders you have.  Any surgeries you have had.  Any medical conditions you have. What are the risks? Generally, this is a safe procedure. However, problems can occur and include:  Bleeding from the procedure site.  Infection.  Injury to structures near the thyroid gland.  What happens before the procedure?  Ask your health care provider about: ? Changing or stopping your regular medicines. This is especially important if you are taking diabetes medicines or blood thinners. ? Taking medicines such as aspirin and ibuprofen. These medicines can thin your blood. Do not take these medicines before your procedure if your health care provider asks you not to.  Do not eat or drink anything after midnight on the night before the procedure or as directed by your health care provider.  You may have a blood sample taken. What happens during the procedure? Either of these methods may be used to perform a thyroid biopsy:  Fine needle biopsy. You may  be given medicine to help you relax (sedative). You will be asked to lie on your back with your head tipped backward to extend your neck. An area on your neck will be cleaned. A needle will then be inserted through the skin of your neck. You may be asked to avoid coughing, talking, swallowing, or making sounds during some portions of the procedure. The needle will be withdrawn once the tissue or fluid samples have been removed. Pressure may be applied to your neck to reduce swelling and ensure that bleeding has stopped. The samples will be sent to a lab for examination.  Open biopsy. You will be given medicine to make you sleep (general anesthetic). An incision will be made in your neck. A sample of thyroid tissue will be removed using surgical tools. The tissue sample will be sent for examination. In some cases, the sample may be examined during the biopsy. If that is done and cancer cells are found, some or all of the thyroid gland may be removed. The incision will be closed with stitches.  What happens after the procedure?  Your recovery will be assessed and monitored.  You may have soreness and tenderness at the site of the biopsy. This should go away after a few days.  If you had an open biopsy, you may have a hoarse voice or sore throat for a couple days.  It is your responsibility to get your test results. This information is not intended to replace advice given to you by your health care provider. Make sure you discuss any questions you have with  your health care provider. Document Released: 03/14/2007 Document Revised: 01/18/2016 Document Reviewed: 08/09/2013 Elsevier Interactive Patient Education  [employer].

## 2017-08-02 NOTE — Procedures (Signed)
US thyroid biopsy without difficulty  Complications:  None  Blood Loss: none  See dictation in canopy pacs

## 2017-08-03 LAB — CYTOLOGY - NON PAP

## 2017-08-05 ENCOUNTER — Encounter: Payer: Self-pay | Admitting: Nurse Practitioner

## 2017-08-05 ENCOUNTER — Inpatient Hospital Stay: Payer: Medicare Other | Attending: Nurse Practitioner | Admitting: Nurse Practitioner

## 2017-08-05 ENCOUNTER — Telehealth: Payer: Self-pay | Admitting: *Deleted

## 2017-08-05 VITALS — BP 160/84 | HR 92 | Temp 96.4°F | Resp 16 | Wt 165.0 lb

## 2017-08-05 DIAGNOSIS — Z85828 Personal history of other malignant neoplasm of skin: Secondary | ICD-10-CM | POA: Diagnosis not present

## 2017-08-05 DIAGNOSIS — C3491 Malignant neoplasm of unspecified part of right bronchus or lung: Secondary | ICD-10-CM

## 2017-08-05 DIAGNOSIS — R030 Elevated blood-pressure reading, without diagnosis of hypertension: Secondary | ICD-10-CM | POA: Diagnosis not present

## 2017-08-05 DIAGNOSIS — C3431 Malignant neoplasm of lower lobe, right bronchus or lung: Secondary | ICD-10-CM | POA: Insufficient documentation

## 2017-08-05 DIAGNOSIS — R0989 Other specified symptoms and signs involving the circulatory and respiratory systems: Secondary | ICD-10-CM

## 2017-08-05 DIAGNOSIS — F458 Other somatoform disorders: Secondary | ICD-10-CM | POA: Diagnosis not present

## 2017-08-05 DIAGNOSIS — Z87891 Personal history of nicotine dependence: Secondary | ICD-10-CM | POA: Diagnosis not present

## 2017-08-05 DIAGNOSIS — J0100 Acute maxillary sinusitis, unspecified: Secondary | ICD-10-CM | POA: Diagnosis not present

## 2017-08-05 DIAGNOSIS — N320 Bladder-neck obstruction: Secondary | ICD-10-CM | POA: Insufficient documentation

## 2017-08-05 DIAGNOSIS — N400 Enlarged prostate without lower urinary tract symptoms: Secondary | ICD-10-CM | POA: Diagnosis not present

## 2017-08-05 DIAGNOSIS — J8410 Pulmonary fibrosis, unspecified: Secondary | ICD-10-CM | POA: Insufficient documentation

## 2017-08-05 DIAGNOSIS — Z923 Personal history of irradiation: Secondary | ICD-10-CM | POA: Diagnosis not present

## 2017-08-05 DIAGNOSIS — E042 Nontoxic multinodular goiter: Secondary | ICD-10-CM | POA: Diagnosis not present

## 2017-08-05 DIAGNOSIS — I10 Essential (primary) hypertension: Secondary | ICD-10-CM

## 2017-08-05 DIAGNOSIS — R1310 Dysphagia, unspecified: Secondary | ICD-10-CM | POA: Diagnosis not present

## 2017-08-05 NOTE — Telephone Encounter (Signed)
Patient accept 2 pm appointment today

## 2017-08-05 NOTE — Patient Instructions (Signed)
Dysphagia Dysphagia is trouble swallowing. This condition occurs when solids and liquids stick in a person's throat on the way down to the stomach, or when food takes longer to get to the stomach. You may have problems swallowing food, liquids, or both. You may also have pain while trying to swallow. It may take you more time and effort to swallow something. What are the causes? This condition is caused by:  Problems with the muscles. They may make it difficult for you to move food and liquids through the tube that connects your mouth to your stomach (esophagus). You may have ulcers, scar tissue, or inflammation that blocks the normal passage of food and liquids. Causes of these problems include: ? Acid reflux from your stomach into your esophagus (gastroesophageal reflux). ? Infections. ? Radiation treatment for cancer. ? Medicines taken without enough fluids to wash them down into your stomach.  Nerve problems. These prevent signals from being sent to the muscles of your esophagus to squeeze (contract) and move what you swallow down to your stomach.  Globus pharyngeus. This is a common problem that involves feeling like something is stuck in the throat or a sense of trouble with swallowing even though nothing is wrong with the swallowing passages.  Stroke. This can affect the nerves and make it difficult to swallow.  Certain conditions, such as cerebral palsy or Parkinson disease.  What are the signs or symptoms? Common symptoms of this condition include:  A feeling that solids or liquids are stuck in your throat on the way down to the stomach.  Food taking too long to get to the stomach.  Other symptoms include:  Food moving back from your stomach to your mouth (regurgitation).  Noises coming from your throat.  Chest discomfort with swallowing.  A feeling of fullness when swallowing.  Drooling, especially when the throat is blocked.  Pain while  swallowing.  Heartburn.  Coughing or gagging while trying to swallow.  How is this diagnosed? This condition is diagnosed by:  Barium X-ray. In this test, you swallow a white substance (contrast medium)that sticks to the inside of your esophagus. X-ray images are then taken.  Endoscopy. In this test, a flexible telescope is inserted down your throat to look at your esophagus and your stomach.  CT scans and MRI.  How is this treated? Treatment for dysphagia depends on the cause of the condition:  If the dysphagia is caused by acid reflux or infection, medicines may be used. They may include antibiotics and heartburn medicines.  If the dysphagia is caused by problems with your muscles, swallowing therapy may be used to help you strengthen your swallowing muscles. You may have to do specific exercises to strengthen the muscles or stretch them.  If the dysphagia is caused by a blockage or mass, procedures to remove the blockage may be done. You may need surgery and a feeding tube.  You may need to make diet changes. Ask your health care provider for specific instructions. Follow these instructions at home: Eating and drinking  Try to eat soft food that is easier to swallow.  Follow any diet changes as told by your health care provider.  Cut your food into small pieces and eat slowly.  Eat and drink only when you are sitting upright.  Do not drink alcohol or caffeine. If you need help quitting, ask your health care provider. General instructions  Check your weight every day to make sure you are not losing weight.  Take over-the-counter   and prescription medicines only as told by your health care provider.  If you were prescribed an antibiotic medicine, take it as told by your health care provider. Do not stop taking the antibiotic even if you start to feel better.  Do not use any products that contain nicotine or tobacco, such as cigarettes and e-cigarettes. If you need help  quitting, ask your health care provider.  Keep all follow-up visits as told by your health care provider. This is important. Contact a health care provider if:  You lose weight because you cannot swallow.  You cough when you drink liquids (aspiration).  You cough up partially digested food. Get help right away if:  You cannot swallow your saliva.  You have shortness of breath or a fever, or both.  You have a hoarse voice and also have trouble swallowing. Summary  Dysphagia is trouble swallowing. This condition occurs when solids and liquids stick in a person's throat on the way down to the stomach, or when food takes longer to get to the stomach.  Dysphagia has many possible causes and symptoms.  Treatment for dysphagia depends on the cause of the condition. This information is not intended to replace advice given to you by your health care provider. Make sure you discuss any questions you have with your health care provider. Document Released: 05/14/2000 Document Revised: 05/06/2016 Document Reviewed: 05/06/2016 Elsevier Interactive Patient Education  2017 [place]. Ranitidine tablets or capsules What is this medicine? RANITIDINE (ra NYE te deen) is a type of antihistamine that blocks the release of stomach acid. It is used to treat stomach or intestinal ulcers. It can relieve ulcer pain and discomfort, and the heartburn from acid reflux. This medicine may be used for other purposes; ask your health care provider or pharmacist if you have questions. COMMON BRAND NAME(S): Acid Reducer, Ranitidine, Taladine, Wal-Zan, Zantac, Zantac 150, Zantac 75 What should I tell my health care provider before I take this medicine? They need to know if you have any of these conditions: -kidney disease -liver disease -porphyria -an unusual or allergic reaction to ranitidine, other medicines, foods, dyes, or preservatives -pregnant or trying to get pregnant -breast-feeding How should I use  this medicine? Take this medicine by mouth with a glass of water. Follow the directions on the prescription label. If you only take this medicine once a day, take it at bedtime. Take your medicine at regular intervals. Do not take your medicine more often than directed. Do not stop taking except on your doctor's advice. Talk to your pediatrician regarding the use of this medicine in children. Special care may be needed. Overdosage: If you think you have taken too much of this medicine contact a poison control center or emergency room at once. NOTE: This medicine is only for you. Do not share this medicine with others. What if I miss a dose? If you miss a dose, take it as soon as you can. If it is almost time for your next dose, take only that dose. Do not take double or extra doses. What may interact with this medicine? -atazanavir -delavirdine -gefitinib -glipizide -ketoconazole -midazolam -procainamide -propantheline -triazolam -warfarin This list may not describe all possible interactions. Give your health care provider a list of all the medicines, herbs, non-prescription drugs, or dietary supplements you use. Also tell them if you smoke, drink alcohol, or use illegal drugs. Some items may interact with your medicine. What should I watch for while using this medicine? Tell your doctor or  health care professional if your condition does not start to get better or gets worse. You may need to take this medicine for several days as prescribed before your symptoms get better. Finish the full course of tablets prescribed, even if you feel better. Do not smoke cigarettes or drink alcohol. These increase irritation in your stomach and can lengthen the time it will take for ulcers to heal. Cigarettes and alcohol can also make acid reflux or heartburn worse. If you get black, tarry stools or vomit up what looks like coffee grounds, call your doctor or health care professional at once. You may have a  bleeding ulcer. What side effects may I notice from receiving this medicine? Side effects that you should report to your doctor or health care professional as soon as possible: -agitation, nervousness, depression, hallucinations -allergic reactions like skin rash, itching or hives, swelling of the face, lips, or tongue -breast enlargement in both males and females -breathing problems -redness, blistering, peeling or loosening of the skin, including inside the mouth -unusual bleeding or bruising -unusually weak or tired -vomiting -yellowing of the skin or eyes Side effects that usually do not require medical attention (report to your doctor or health care professional if they continue or are bothersome): -constipation or diarrhea -dizziness -headache -nausea This list may not describe all possible side effects. Call your doctor for medical advice about side effects. You may report side effects to FDA at 1-800-FDA-1088. Where should I keep my medicine? Keep out of the reach of children. Store at room temperature between 15 and 30 degrees C (59 and 86 degrees F). Protect from light and moisture. Keep container tightly closed. Throw away any unused medicine after the expiration date. NOTE: This sheet is a summary. It may not cover all possible information. If you have questions about this medicine, talk to your doctor, pharmacist, or health care provider.  2018 Elsevier/Gold Standard (2012-09-06 14:50:34)

## 2017-08-05 NOTE — Progress Notes (Signed)
Symptom Management Consult note [facility]  Telephone:([phone] Fax:(336) [phone]  Patient Care Team: Derinda Late, MD as PCP - General (Family Medicine) Telford Nab, RN as Registered Nurse   Name of the patient: Marvin Williams  [id number]  02/17/1946   Date of visit: 08/05/17  Diagnosis- Stage IIB squamous lung cancer  Chief complaint/ Reason for visit- Dysphagia  Heme/Onc history: Patient last evaluated by primary oncologist, Dr. Tasia Catchings, on 07/26/17.  History of stage IIb carcinoma of the right lung cT3, cN0, cM0.  Initial mass was detected on chest x-ray on 05/22/17 in the right lower lobe.  CT chest on 05/22/17 showed macrolobulated right lower lobe pulmonary mass with evidence of peripheral spiculation measuring 5.4 cm concerning for primary bronchogenic neoplasm.  Central low attenuation is consistent with central necrosis.  Evidence of prior granulomatous disease with pulmonary granulomas in both lower lobes and calcified right hilar mediastinal lymph nodes.  Modest combined centrilobular and paraseptal pulmonary emphysema.  Arteriosclerosis.  Probable hepatic and bilateral renal cysts. PET scan on 06/02/17 showed 6 cm hypermetabolic mass in the posterior right lower lobe consistent with bronchogenic carcinoma.  Focal uptake within the right thyroid lobe.  Mildly enlarged prostate and findings of chronic bladder outlet obstruction.  Moderate left renal pelvicaliectasis may be due to congenital partial UPJ obstruction or chronic vesicouretera reflux.  MRI brain on 06/02/17 showed no evidence for intracranial metastatic disease.  Acute right maxillary sinusitis evident. Right lower lobe lung mass biopsied on 06/13/17. 06/13/2017-pathology revealed squamous cell carcinoma 06/21/17-patient evaluated by Dr. Genevive Bi for surgery. FEV1 and DLCO approximately 60% of normal.  06/27/17-patient declined surgical intervention and was offered evaluation with radiation oncology    07/01/17- Thyroid ultrasound showed asymmetric thyroid with 2 right nodules.  Inferior right nodule corresponds to the focus of the abnormal activity on PET scan. 07/05/17-evaluated by  Dr. Esperanza Heir Onc who recommended 7000 cGy in 10 fractions. Dr. Baruch Gouty did not recommend systemic chemotherapy.  07/19/17-initiated XRT 08/01/17-completed XRT 08/02/17- FINA biopsy thyroid of right inferior nodule; pathology shows benign, consistent with benign follicular nodule.  Interval history-  Marvin Williams presents with dysphagia.  Symptoms occurred starting 1 day ago. He had a biopsy of his thyroid on 08/02/17. He completed radiation on 08/01/17 which he states he tolerated well w/o significant side effects. Says symptoms only occurs when swallowing hot liquids or ice cold liquids.  Unsure if exacerbated by food.  States that he feels like things get "hung up" localizing to mid neck.  Symptoms resolved after 1-2 minutes with no lasting effects.  Describes symptoms as 'mild'. States that nothing has become stuck but feels a lump sensation when swallowing.  No voice alterations, cough, drooling.  Patient denies regurgitation of undigested food.  Patient also questioning results of thyroid biopsy. States he has quit smoking.   ECOG FS:0 - Asymptomatic  Review of systems- Review of Systems  Constitutional: Negative.   HENT: Negative for congestion, ear discharge, ear pain, hearing loss, nosebleeds, sinus pain, sore throat and tinnitus.   Eyes: Negative.   Respiratory: Negative.  Negative for stridor.   Cardiovascular: Negative.   Gastrointestinal: Positive for heartburn.  Genitourinary: Negative.   Musculoskeletal: Negative.   Skin: Negative.   Neurological: Negative.   Endo/Heme/Allergies: Negative.   Psychiatric/Behavioral: The patient is nervous/anxious.     Current treatment- s/p XRT, declined surgery.   No Known Allergies  Past Medical History:  Diagnosis Date  . Cancer ([place])    skin  Past  Surgical History:  Procedure Laterality Date  . BACK SURGERY    . COLONOSCOPY     Social History   Socioeconomic History  . Marital status: Married    Spouse name: Not on file  . Number of children: Not on file  . Years of education: Not on file  . Highest education level: Not on file  Social Needs  . Financial resource strain: Not on file  . Food insecurity - worry: Not on file  . Food insecurity - inability: Not on file  . Transportation needs - medical: Not on file  . Transportation needs - non-medical: Not on file  Occupational History  . Not on file  Tobacco Use  . Smoking status: Former Research scientist (life sciences)  . Smokeless tobacco: Never Used  . Tobacco comment: Last cigarette on 06/04/17  Substance and Sexual Activity  . Alcohol use: No    Frequency: Never  . Drug use: No  . Sexual activity: Not on file  Other Topics Concern  . Not on file  Social History Narrative  . Not on file   No family history on file.  No current outpatient medications on file.  Physical exam:  Vitals:   08/05/17 1415  BP: (!) 160/84  Pulse: 92  Resp: 16  Temp: (!) 96.4 F (35.8 C)  TempSrc: Tympanic  Weight: 165 lb (74.8 kg)   Physical Exam  Constitutional: He is oriented to person, place, and time and well-developed, well-nourished, and in no distress. No distress.  HENT:  Head: Normocephalic and atraumatic.  Mouth/Throat: Oropharynx is clear and moist.  Speaking in full sentences. Managing Secretions.   Eyes: Conjunctivae are normal. Pupils are equal, round, and reactive to light. No scleral icterus.  Neck: Normal range of motion. Neck supple.  Cardiovascular: Normal rate and regular rhythm.  Pulmonary/Chest: Effort normal and breath sounds normal. No stridor. No respiratory distress. He has no wheezes.  Abdominal: Soft. He exhibits no distension.  Musculoskeletal: He exhibits no edema.  Neurological: He is alert and oriented to person, place, and time.  Skin: Skin is warm and dry.    Psychiatric: Affect normal.     CMP Latest Ref Rng & Units 07/26/2017  Glucose 65 - 99 mg/dL 100(H)  BUN 6 - 20 mg/dL 17  Creatinine 0.61 - 1.24 mg/dL 1.16  Sodium 135 - 145 mmol/L 139  Potassium 3.5 - 5.1 mmol/L 4.8  Chloride 101 - 111 mmol/L 103  CO2 22 - 32 mmol/L 28  Calcium 8.9 - 10.3 mg/dL 9.6  Total Protein 6.5 - 8.1 g/dL 7.7  Total Bilirubin 0.3 - 1.2 mg/dL 0.5  Alkaline Phos 38 - 126 U/L 62  AST 15 - 41 U/L 15  ALT 17 - 63 U/L 10(L)   CBC Latest Ref Rng & Units 07/26/2017  WBC 3.8 - 10.6 K/uL 7.1  Hemoglobin 13.0 - 18.0 g/dL 14.6  Hematocrit 40.0 - 52.0 % 43.2  Platelets 150 - 440 K/uL 359    [place] Fna Biopsy Thyroid 1st Lesion  Result Date: 08/02/2017 INDICATION: Indeterminate thyroid nodule EXAM: ULTRASOUND GUIDED FINE NEEDLE ASPIRATION OF INDETERMINATE THYROID NODULE COMPARISON:  07/01/2017 MEDICATIONS: None COMPLICATIONS: None immediate. TECHNIQUE: Informed written consent was obtained from the patient after a discussion of the risks, benefits and alternatives to treatment. Questions regarding the procedure were encouraged and answered. A timeout was performed prior to the initiation of the procedure. Pre-procedural ultrasound scanning demonstrated unchanged size and appearance of the indeterminate nodule within the lower pole of  the right thyroid The procedure was planned. The neck was prepped in the usual sterile fashion, and a sterile drape was applied covering the operative field. A timeout was performed prior to the initiation of the procedure. Local anesthesia was provided with 1% lidocaine. Under direct ultrasound guidance, 4 FNA biopsies were performed of the right lower pole thyroid nodule with a 25 gauge needle. Two additional biopsy passes were obtained for Afirma testing. Multiple ultrasound images were saved for procedural documentation purposes. The samples were prepared and submitted to pathology. Limited post procedural scanning was negative for hematoma or  additional complication. Dressings were placed. The patient tolerated the above procedures procedure well without immediate postprocedural complication. FINDINGS: Nodule reference number based on prior diagnostic ultrasound: 2 Maximum size: 1.7 cm Location: Right; inferior ACR TI-RADS risk category: Choose for Reason for biopsy: meets ACR TI-RADS criteria Ultrasound imaging confirms appropriate placement of the needles within the thyroid nodule. IMPRESSION: Technically successful ultrasound guided fine needle aspiration of right inferior thyroid nodule Electronically Signed   By: Inez Catalina M.D.   On: 08/02/2017 15:20     Assessment and plan- Patient is a 72 y.o. male who presents to symptom management clinic for dysphagia.  1.  Stage IIb (cT3, cN0, cM0) squamous cell carcinoma of the right lung-has now completed radiation.  Tolerated well. Follow-up with med-onc and rad-onc in one month for re-evaluation and consideration of CT imaging every 3-6 months.    2.  Globus sensation/Dysphagia- likely post-inflammatory r/t thyroid biopsy and/or XRT. May also be r/t acid reflux and/or recent anxiety and stress. Can do trial of OTC naproxen and zantac for 3 days. If symptoms don't improve, would recommend re-evaluation and consideration of swallowing study and/or thyroid ultrasound.   3. Hypertension- BP elevated in clinic. Advised to track BP at home and follow up with PCP for further evaluation and management.   4. History of Smoking- he has quit smoking. Reports weight gain and anxiety however, I applaud his efforts in stopping smoking. Offered referral to [employer] if he needs further support and/or reinforcement. He declines at this time.   Follow up with Dr. Tasia Catchings and Dr. Baruch Gouty as previously scheduled. Patient advised to notify the clinic if there is no improvement in symptoms or if symptoms worsen in next 3-4 days.   Visit Diagnosis 1. Squamous carcinoma of lung, right ([place])   2. Globus  sensation   3. Hypertension, unspecified type   4. History of smoking    Patient expressed understanding and was in agreement with this plan. He also understands that He can call clinic at any time with any questions, concerns, or complaints.   Beckey Rutter, DNP, AGNP-C [place] at [facility] [phone] (office) 08/05/17 6:52 PM

## 2017-08-05 NOTE — Telephone Encounter (Signed)
Dr Tasia Catchings requests he be evaluated in clinic.

## 2017-08-05 NOTE — Telephone Encounter (Signed)
Patient called to report that he is having difficulty swallowing. He reports that when he swallows something, it gets half way down and feels it gets stuck causing pain an dit will then go away. Asking what could be causing it, his thyroid? Please advise

## 2017-08-30 ENCOUNTER — Other Ambulatory Visit: Payer: Self-pay

## 2017-08-30 ENCOUNTER — Other Ambulatory Visit: Payer: Self-pay | Admitting: *Deleted

## 2017-08-30 ENCOUNTER — Inpatient Hospital Stay: Payer: Medicare Other | Attending: Oncology | Admitting: Oncology

## 2017-08-30 ENCOUNTER — Ambulatory Visit
Admission: RE | Admit: 2017-08-30 | Discharge: 2017-08-30 | Disposition: A | Discharge status: Home / Self Care | Payer: Medicare Other | Source: Ambulatory Visit | Attending: Radiation Oncology | Admitting: Radiation Oncology

## 2017-08-30 ENCOUNTER — Encounter: Payer: Self-pay | Admitting: Oncology

## 2017-08-30 VITALS — BP 162/84 | HR 60 | Temp 95.9°F | Wt 163.4 lb

## 2017-08-30 DIAGNOSIS — C3491 Malignant neoplasm of unspecified part of right bronchus or lung: Secondary | ICD-10-CM

## 2017-08-30 DIAGNOSIS — Z87891 Personal history of nicotine dependence: Secondary | ICD-10-CM | POA: Diagnosis not present

## 2017-08-30 DIAGNOSIS — E041 Nontoxic single thyroid nodule: Secondary | ICD-10-CM

## 2017-08-30 DIAGNOSIS — Z923 Personal history of irradiation: Secondary | ICD-10-CM | POA: Insufficient documentation

## 2017-08-30 DIAGNOSIS — I739 Peripheral vascular disease, unspecified: Secondary | ICD-10-CM | POA: Diagnosis not present

## 2017-08-30 DIAGNOSIS — Z85828 Personal history of other malignant neoplasm of skin: Secondary | ICD-10-CM | POA: Diagnosis not present

## 2017-08-30 DIAGNOSIS — C3431 Malignant neoplasm of lower lobe, right bronchus or lung: Secondary | ICD-10-CM | POA: Diagnosis not present

## 2017-08-30 DIAGNOSIS — R030 Elevated blood-pressure reading, without diagnosis of hypertension: Secondary | ICD-10-CM | POA: Diagnosis not present

## 2017-08-30 NOTE — Progress Notes (Signed)
Patient here today for follow up.  Patient c/o pressure in the right lower/mid back.

## 2017-08-30 NOTE — Progress Notes (Signed)
Radiation Oncology Follow up Note  Name: Marvin Williams   Date:   08/30/2017 MRN:  [id number] DOB: 01/27/1946    This 72 y.o. male presents to the clinic today for one-month follow-up status post adiation therapy for stage IIB (T3 N0 M0 squamous cell carcinoma of the right lower lobe.  REFERRING PROVIDER: Derinda Late, MD  HPI: patient is a 72 year old male now out 1 month having completed radiation treatment for stage IIB (T3 N0 M0) [place] cell carcinoma the right lower lobe..he is seen today in routine follow up is doing well specifically denies cough hemoptysis or chest tightness.he recently had his thyroid biopsy which was benign consistent with benign follicular nodule.  COMPLICATIONS OF TREATMENT: none  FOLLOW UP COMPLIANCE: keeps appointments   PHYSICAL EXAM:  There were no vitals taken for this visit. Well-developed well-nourished patient in NAD. HEENT reveals PERLA, EOMI, discs not visualized.  Oral cavity is clear. No oral mucosal lesions are identified. Neck is clear without evidence of cervical or supraclavicular adenopathy. Lungs are clear to A&P. Cardiac examination is essentially unremarkable with regular rate and rhythm without murmur rub or thrill. Abdomen is benign with no organomegaly or masses noted. Motor sensory and DTR levels are equal and symmetric in the upper and lower extremities. Cranial nerves II through XII are grossly intact. Proprioception is intact. No peripheral adenopathy or edema is identified. No motor or sensory levels are noted. Crude visual fields are within normal range.  RADIOLOGY RESULTS: CT scan of the chest with contrast has been ordered in one month  PLAN: this time patient is doing well recovering nicely 1 month out from radiation therapy to his chest. I'm please was overall progress. I've ordered a follow-up CT scan with contrast of his chest that next month. I've asked to see him back in 3 months for follow-up. Patient is to call with any  concerns.  I would like to take this opportunity to thank you for allowing me to participate in the care of your patient.Noreene Filbert, MD

## 2017-08-30 NOTE — Progress Notes (Signed)
Hematology/Oncology Follow up note [facility] Telephone:(336) [phone] Fax:(336) [phone]   Patient Care Team: Derinda Late, MD as PCP - General (Family Medicine) Telford Nab, RN as Registered Nurse  . REASON FOR VISIT Follow up for treatment of squamous lung cancer, thyroid nodule HISTORY OF PRESENTING ILLNESS:  Marvin Williams is a  72 y.o.  male follows up for management of squamous lung cancer, stage IIB (cT3,cN0 cM0) His case was discussed on tumor board.  Although he has mild asymmetric FDG activity iin the right hilum with SUV max of 2.9. This corresponds to a densely calcified hilar lymph node, consensus was reached that this is consistent with  old granulomatous disease. He is referred to Dr.Oaks to discuss about surgery.   #Ultrasound thyroid was obtained which showed ill-defined right inferior lobe nodule in the thyroid, 1.7 cm by 1.1 x 1.1 cm.  Given that the size is more than 1.5 cm in appearance, fine-needle aspiration of this suspicious nodule is recommended based on TI -Rads criteria.  INTERVAL HISTORY Marvin Williams is a 72 y.o. male who has above history reviewed by me today presents for follow up visit for management of lung cancer and thyroid nodule. [name]  He completed Radiation treatment. During the interval he has had thyroid nodule biopsy and he presents to discuss results.  Doing well. Denies any heart burn, chest pain, abdominal pain. Only complaint he has today is that he has lower extremity pain on exertion, resolved after resting.   Review of Systems  Constitutional: Negative for chills, diaphoresis and malaise/fatigue.  HENT: Negative for ear discharge, ear pain and hearing loss.   Eyes: Negative for double vision and photophobia.  Respiratory: Negative for cough and hemoptysis.   Cardiovascular: Negative for chest pain, orthopnea and claudication.  Gastrointestinal: Negative for abdominal pain, constipation, heartburn, nausea and  vomiting.  Genitourinary: Negative for dysuria, hematuria and urgency.  Musculoskeletal: Negative for back pain, myalgias and neck pain.  Skin: Negative for itching and rash.  Neurological: Negative for dizziness, tingling, weakness and headaches.  Endo/Heme/Allergies: Does not bruise/bleed easily.  Psychiatric/Behavioral: Negative for depression and hallucinations.    MEDICAL HISTORY:  Past Medical History:  Diagnosis Date  . Cancer [facility])    skin    SURGICAL HISTORY: Past Surgical History:  Procedure Laterality Date  . BACK SURGERY    . COLONOSCOPY      SOCIAL HISTORY: Social History   Socioeconomic History  . Marital status: Married    Spouse name: Not on file  . Number of children: Not on file  . Years of education: Not on file  . Highest education level: Not on file  Occupational History  . Not on file  Social Needs  . Financial resource strain: Not on file  . Food insecurity:    Worry: Not on file    Inability: Not on file  . Transportation needs:    Medical: Not on file    Non-medical: Not on file  Tobacco Use  . Smoking status: Former Research scientist (life sciences)  . Smokeless tobacco: Never Used  . Tobacco comment: Last cigarette on 06/04/17  Substance and Sexual Activity  . Alcohol use: No    Frequency: Never  . Drug use: No  . Sexual activity: Not on file  Lifestyle  . Physical activity:    Days per week: Not on file    Minutes per session: Not on file  . Stress: Not on file  Relationships  . Social connections:  Talks on phone: Not on file    Gets together: Not on file    Attends religious service: Not on file    Active member of club or organization: Not on file    Attends meetings of clubs or organizations: Not on file    Relationship status: Not on file  . Intimate partner violence:    Fear of current or ex partner: Not on file    Emotionally abused: Not on file    Physically abused: Not on file    Forced sexual activity: Not on file  Other Topics Concern    . Not on file  Social History Narrative  . Not on file    FAMILY HISTORY: History reviewed. No pertinent family history.  ALLERGIES:  has No Known Allergies.  MEDICATIONS:  No current outpatient medications on file.   No current facility-administered medications for this visit.      PHYSICAL EXAMINATION: ECOG PERFORMANCE STATUS: 0 - Asymptomatic Vitals:   08/30/17 0952  BP: (!) 162/84  Pulse: 60  Temp: (!) 95.9 F (35.5 C)  SpO2: 99%   Filed Weights   08/30/17 0952  Weight: 163 lb 7 oz (74.1 kg)    Physical Exam  Constitutional: He is oriented to person, place, and time and well-developed, well-nourished, and in no distress. No distress.  HENT:  Head: Normocephalic and atraumatic.  Mouth/Throat: Oropharynx is clear and moist. No oropharyngeal exudate.  Eyes: Pupils are equal, round, and reactive to light. Conjunctivae and EOM are normal. Left eye exhibits no discharge. No scleral icterus.  Neck: Normal range of motion. Neck supple.  Cardiovascular: Normal rate, regular rhythm and normal heart sounds. Exam reveals no friction rub.  No murmur heard. Pulmonary/Chest: Effort normal and breath sounds normal. He has no wheezes. He has no rales.  Decreased breath sounds bilaterally.  Abdominal: Soft. Bowel sounds are normal. He exhibits no distension. There is no tenderness.  Musculoskeletal: Normal range of motion. He exhibits no edema.  Lymphadenopathy:    He has no cervical adenopathy.  Neurological: He is alert and oriented to person, place, and time. Coordination normal.  Skin: Skin is warm and dry. No erythema.  Psychiatric: Affect and judgment normal.     LABORATORY DATA:  I have reviewed the data as listed Lab Results  Component Value Date   WBC 7.1 07/26/2017   HGB 14.6 07/26/2017   HCT 43.2 07/26/2017   MCV 89.0 07/26/2017   PLT 359 07/26/2017   Recent Labs    05/22/17 0940 07/26/17 0940  NA 135 139  K 4.8 4.8  CL 102 103  CO2 25 28  GLUCOSE  110* 100*  BUN 17 17  CREATININE 1.13 1.16  CALCIUM 9.4 9.6  GFRNONAA >60 >60  GFRAA >60 >60  PROT  --  7.7  ALBUMIN  --  3.9  AST  --  15  ALT  --  10*  ALKPHOS  --  62  BILITOT  --  0.5       ASSESSMENT & PLAN:  Cancer Staging Squamous carcinoma of lung, right ([place]) Staging form: Lung, AJCC 8th Edition - Clinical stage from 06/16/2017: Stage IIB (cT3, cN0, cM0) - Signed by Earlie Server, MD on 06/16/2017  1. Squamous carcinoma of lung, right ([place])   2. Thyroid nodule   3. Elevated blood pressure reading   4. Claudication of lower extremity ([place])    # Stage IIB lung squamous cancer s/p RT, finished at the end of February 2019.  He will have repeat CT scan in May.  #Continue follow up with radiation. [name]  #Thyroid nodule: normal TSH. Biopsy of thyroid nodule pathology result showed benign follicular nodule, discussed with patient.  # Elevated blood pressure   Advised patient to follow-up with primary care physician. Patient wants to switch primary care physician and ask for a referral. Will refer to Dr.Sonnerberg.  Patient has more than one occasion of HTN and likely need to be started on antihypertensives.   # Claudication: former smoker, likely due to peripheral vascular disease. Refer to Vascular surgery.   The patient knows to call the clinic with any problems questions or concerns.  Return of visit: 3 months.   Earlie Server, MD, PhD Hematology Oncology [facility] at [facility] Pager- [phone] 08/30/2017

## 2017-09-20 ENCOUNTER — Encounter (INDEPENDENT_AMBULATORY_CARE_PROVIDER_SITE_OTHER): Payer: Self-pay | Admitting: Vascular Surgery

## 2017-09-20 ENCOUNTER — Ambulatory Visit (INDEPENDENT_AMBULATORY_CARE_PROVIDER_SITE_OTHER): Payer: Medicare Other | Admitting: Vascular Surgery

## 2017-09-20 VITALS — BP 137/84 | Resp 14 | Ht 69.0 in | Wt 163.0 lb

## 2017-09-20 DIAGNOSIS — C3491 Malignant neoplasm of unspecified part of right bronchus or lung: Secondary | ICD-10-CM

## 2017-09-20 DIAGNOSIS — I739 Peripheral vascular disease, unspecified: Secondary | ICD-10-CM

## 2017-09-20 NOTE — Assessment & Plan Note (Signed)
Has recently completed chemotherapy and radiation and has a repeat CT scan ordered for next month for further evaluation.

## 2017-09-20 NOTE — Patient Instructions (Signed)

## 2017-09-20 NOTE — Assessment & Plan Note (Signed)
Recommend: We discussed the pathophysiology and natural history of peripheral arterial disease in detail today.  Patient certainly has symptoms consistent with that.  This has been progressive and gotten worse over the past several months although he has had symptoms now for several years.  Patient should undergo arterial duplex of the lower extremity ASAP because there has been a significant deterioration in the patient's lower extremity symptoms.  The patient states they are having increased pain and a marked decrease in the distance that they can walk.  The risks and benefits as well as the alternatives were discussed in detail with the patient.  All questions were answered.  Patient agrees to proceed and understands this could be a prelude to angiography and intervention.  The patient will follow up with me in the office to review the studies.

## 2017-09-20 NOTE — Progress Notes (Signed)
Patient ID: Marvin Williams, male   DOB: 01/16/1946, 72 y.o.   MRN: [id number]  Chief Complaint  Patient presents with  . New Patient (Initial Visit)    PAD    HPI Marvin Williams is a 72 y.o. male.  I am asked to see the patient by Dr. Tasia Catchings for evaluation of claudication.  The patient reports worsening cramping and pain in his legs with activity that is generally relieved with rest.  He has noticed this now for at least a couple of years but it has been progressive and over the past few months has become much more problematic.  Both legs are affected about the same.  No ulceration or infection.  No fever or chills.  The patient has a long history of tobacco use but says he quit about 4 months ago.  He has been treated for lung cancer and recently completed his therapy.  He is scheduled to have this reevaluated next month with a CT scan.  He reports the pain largely to be in the calves and lower legs.  The distance he can walk has decreased over time and now is much shorter.  There is no clear inciting event or causative factor that started his symptoms.   Past Medical History:  Diagnosis Date  . Cancer ([place])    skin  . Peripheral arterial disease [facility])     Past Surgical History:  Procedure Laterality Date  . BACK SURGERY    . BACK SURGERY     1971  . COLONOSCOPY      Family History  Problem Relation Age of Onset  . Cancer Mother   . Cancer Father   . Cancer Sister   no bleeding disorders, clotting disorders, autoimmune diseases, or aneurysms  Social History Social History   Tobacco Use  . Smoking status: Former Research scientist (life sciences)  . Smokeless tobacco: Never Used  . Tobacco comment: Last cigarette on 06/04/17  Substance Use Topics  . Alcohol use: No    Frequency: Never  . Drug use: No    No Known Allergies  Current Outpatient Medications  Medication Sig Dispense Refill  . naproxen sodium (ALEVE) 220 MG tablet Take 220 mg by mouth daily as needed.     No current  facility-administered medications for this visit.       REVIEW OF SYSTEMS (Negative unless checked)  Constitutional: '[]'$ Weight loss  '[]'$ Fever  '[]'$ Chills Cardiac: '[]'$ Chest pain   '[]'$ Chest pressure   '[]'$ Palpitations   '[]'$ Shortness of breath when laying flat   '[]'$ Shortness of breath at rest   '[]'$ Shortness of breath with exertion. Vascular:  '[x]'$ Pain in legs with walking   '[]'$ Pain in legs at rest   '[]'$ Pain in legs when laying flat   '[x]'$ Claudication   '[]'$ Pain in feet when walking  '[]'$ Pain in feet at rest  '[]'$ Pain in feet when laying flat   '[]'$ History of DVT   '[]'$ Phlebitis   '[]'$ Swelling in legs   '[]'$ Varicose veins   '[]'$ Non-healing ulcers Pulmonary:   '[]'$ Uses home oxygen   '[]'$ Productive cough   '[]'$ Hemoptysis   '[]'$ Wheeze  '[]'$ COPD   '[]'$ Asthma Neurologic:  '[]'$ Dizziness  '[]'$ Blackouts   '[]'$ Seizures   '[]'$ History of stroke   '[]'$ History of TIA  '[]'$ Aphasia   '[]'$ Temporary blindness   '[]'$ Dysphagia   '[]'$ Weakness or numbness in arms   '[]'$ Weakness or numbness in legs Musculoskeletal:  '[x]'$ Arthritis   '[]'$ Joint swelling   '[]'$ Joint pain   '[]'$ Low back pain Hematologic:  '[]'$ Easy bruising  '[]'$ Easy bleeding   '[]'$   Hypercoagulable state   '[]'$ Anemic  '[]'$ Hepatitis Gastrointestinal:  '[]'$ Blood in stool   '[]'$ Vomiting blood  '[]'$ Gastroesophageal reflux/heartburn   '[]'$ Abdominal pain Genitourinary:  '[]'$ Chronic kidney disease   '[]'$ Difficult urination  '[]'$ Frequent urination  '[]'$ Burning with urination   '[]'$ Hematuria Skin:  '[]'$ Rashes   '[]'$ Ulcers   '[]'$ Wounds Psychological:  '[]'$ History of anxiety   '[]'$  History of major depression.    Physical Exam BP 137/84 (BP Location: Right Arm, Patient Position: Sitting)   Resp 14   Ht '5\' 9"'$  (1.753 m)   Wt 73.9 kg (163 lb)   BMI 24.07 kg/m  Gen:  WD/WN, NAD.  Appears younger than stated age Head: [place]/AT, No temporalis wasting. Ear/Nose/Throat: Hearing grossly intact, nares w/o erythema or drainage, oropharynx w/o Erythema/Exudate Eyes: Conjunctiva clear, sclera non-icteric  Neck: trachea midline.  No bruit or JVD.  Pulmonary:  Good air movement,  clear to auscultation bilaterally.  Cardiac: RRR, normal S1, S2 Vascular:  Vessel Right Left  Radial Palpable Palpable                          PT  1+ palpable  trace palpable  DP  1+ palpable  1+ palpable   Gastrointestinal: soft, non-tender/non-distended.  Musculoskeletal: M/S 5/5 throughout.  Extremities without ischemic changes.  No deformity or atrophy.  No edema. Neurologic: Sensation grossly intact in extremities.  Symmetrical.  Speech is fluent. Motor exam as listed above. Psychiatric: Judgment intact, Mood & affect appropriate for pt's clinical situation. Dermatologic: No rashes or ulcers noted.  No cellulitis or open wounds.  Radiology No results found.  Labs Recent Results (from the past 2160 hour(s))  TSH     Status: None   Collection Time: 07/05/17 11:37 AM  Result Value Ref Range   TSH 1.462 0.350 - 4.500 uIU/mL    Comment: Performed by a 3rd Generation assay with a functional sensitivity of <=0.01 uIU/mL. Performed at [facility], [address] [place], [place] [address]   CBC with Differential/Platelet     Status: None   Collection Time: 07/26/17  9:40 AM  Result Value Ref Range   WBC 7.1 3.8 - 10.6 K/uL   RBC 4.85 4.40 - 5.90 MIL/uL   Hemoglobin 14.6 13.0 - 18.0 g/dL   HCT 43.2 40.0 - 52.0 %   MCV 89.0 80.0 - 100.0 fL   MCH 30.1 26.0 - 34.0 pg   MCHC 33.8 32.0 - 36.0 g/dL   RDW 14.1 11.5 - 14.5 %   Platelets 359 150 - 440 K/uL   Neutrophils Relative % 64 %   Neutro Abs 4.5 1.4 - 6.5 K/uL   Lymphocytes Relative 15 %   Lymphs Abs 1.1 1.0 - 3.6 K/uL   Monocytes Relative 11 %   Monocytes Absolute 0.8 0.2 - 1.0 K/uL   Eosinophils Relative 9 %   Eosinophils Absolute 0.6 0 - 0.7 K/uL   Basophils Relative 1 %   Basophils Absolute 0.1 0 - 0.1 K/uL    Comment: Performed at [facility], [address] [place], [place] [address]  Comprehensive metabolic panel     Status: Abnormal   Collection Time: 07/26/17  9:40 AM  Result Value Ref  Range   Sodium 139 135 - 145 mmol/L   Potassium 4.8 3.5 - 5.1 mmol/L   Chloride 103 101 - 111 mmol/L   CO2 28 22 - 32 mmol/L   Glucose, Bld 100 (H) 65 - 99 mg/dL   BUN 17  6 - 20 mg/dL   Creatinine, Ser 1.16 0.61 - 1.24 mg/dL   Calcium 9.6 8.9 - 10.3 mg/dL   Total Protein 7.7 6.5 - 8.1 g/dL   Albumin 3.9 3.5 - 5.0 g/dL   AST 15 15 - 41 U/L   ALT 10 (L) 17 - 63 U/L   Alkaline Phosphatase 62 38 - 126 U/L   Total Bilirubin 0.5 0.3 - 1.2 mg/dL   GFR calc non Af Amer >60 >60 mL/min   GFR calc Af Amer >60 >60 mL/min    Comment: (NOTE) The eGFR has been calculated using the CKD EPI equation. This calculation has not been validated in all clinical situations. eGFR's persistently <60 mL/min signify possible Chronic Kidney Disease.    Anion gap 8 5 - 15    Comment: Performed at [facility], [address] [place], [place] [address]  Cytology - Non PAP; right thyroid nodule     Status: None   Collection Time: 08/02/17  2:33 PM  Result Value Ref Range   CYTOLOGY - NON GYN      Cytology - Non PAP CASE: [id number] PATIENT: Georgette Shell Non-Gyn Cytology Report     SPECIMEN SUBMITTED: A. Thyroid nodule, right; FNA  CLINICAL HISTORY: Right thyroid nodule right lower pole  PRE-OPERATIVE DIAGNOSIS: Right thyroid nodule right lower pole  POST-OPERATIVE DIAGNOSIS: Right thyroid nodule right lower pole     DIAGNOSIS: A.  RIGHT THYROID NODULE, LOWER POLE; ULTRASOUND-GUIDED FINE-NEEDLE ASPIRATION: - BENIGN (BETHESDA CATEGORY II). - CONSISTENT WITH BENIGN FOLLICULAR NODULE.  Note: The smear contains macrophages, colloid and follicular cells. The cytologic findings are consistent with a colloid nodule. Correlation with sonographic findings is required. ThinPrep, Diff Quik and Pap stained slides were reviewed.   GROSS DESCRIPTION:  A. Site: right thyroid nodule Procedure: ultrasound Cytotechnologist: Ashely Howze Specimen(s) collected: 4 Diff Quik stained  slides 4  Pap stained slides Specimen labeled right thyroid:      Descri ption: clear CytoLyt solution      Submitted for:           ThinPrep           Afirma: [id number]   Final Diagnosis performed by Delorse Lek, MD.  Electronically signed 08/03/2017 2:35:17PM    The electronic signature indicates that the named Attending Pathologist has evaluated the specimen  Technical component performed at [facility], [address], [place], [place] [address] Lab: [phone] Dir: Rush Farmer, MD, MMM  Professional component performed at [facility], [facility], [place], [place], [place] [address] Lab: [phone] Dir: Dellia Nims. Reuel Derby, MD      Assessment/Plan:  No problem-specific Assessment & Plan notes found for this encounter.      Leotis Pain 09/20/2017, 9:19 AM   This note was created with Dragon medical transcription system.  Any errors from dictation are unintentional.

## 2017-09-29 ENCOUNTER — Ambulatory Visit
Admission: RE | Admit: 2017-09-29 | Discharge: 2017-09-29 | Disposition: A | Discharge status: Home / Self Care | Payer: Medicare Other | Source: Ambulatory Visit | Attending: Radiation Oncology | Admitting: Radiation Oncology

## 2017-09-29 DIAGNOSIS — I2510 Atherosclerotic heart disease of native coronary artery without angina pectoris: Secondary | ICD-10-CM | POA: Diagnosis not present

## 2017-09-29 DIAGNOSIS — C3431 Malignant neoplasm of lower lobe, right bronchus or lung: Secondary | ICD-10-CM | POA: Diagnosis not present

## 2017-09-29 DIAGNOSIS — N289 Disorder of kidney and ureter, unspecified: Secondary | ICD-10-CM | POA: Diagnosis not present

## 2017-09-29 DIAGNOSIS — J439 Emphysema, unspecified: Secondary | ICD-10-CM | POA: Diagnosis not present

## 2017-09-29 DIAGNOSIS — C3491 Malignant neoplasm of unspecified part of right bronchus or lung: Secondary | ICD-10-CM

## 2017-09-29 DIAGNOSIS — N281 Cyst of kidney, acquired: Secondary | ICD-10-CM | POA: Insufficient documentation

## 2017-09-29 DIAGNOSIS — I700 Atherosclerosis of aorta: Secondary | ICD-10-CM | POA: Insufficient documentation

## 2017-09-29 HISTORY — DX: Malignant neoplasm of unspecified part of unspecified bronchus or lung: C34.90

## 2017-09-29 MED ORDER — IOPAMIDOL (ISOVUE-300) INJECTION 61%
75.0000 mL | Freq: Once | INTRAVENOUS | Status: AC | PRN
  Administered 2017-09-29: 75 mL via INTRAVENOUS

## 2017-10-12 ENCOUNTER — Encounter (INDEPENDENT_AMBULATORY_CARE_PROVIDER_SITE_OTHER): Payer: Self-pay | Admitting: Vascular Surgery

## 2017-10-12 ENCOUNTER — Ambulatory Visit (INDEPENDENT_AMBULATORY_CARE_PROVIDER_SITE_OTHER): Payer: Medicare Other

## 2017-10-12 ENCOUNTER — Ambulatory Visit (INDEPENDENT_AMBULATORY_CARE_PROVIDER_SITE_OTHER): Payer: Medicare Other | Admitting: Vascular Surgery

## 2017-10-12 VITALS — BP 164/88 | HR 54 | Resp 14 | Ht 68.0 in | Wt 162.0 lb

## 2017-10-12 DIAGNOSIS — I739 Peripheral vascular disease, unspecified: Secondary | ICD-10-CM

## 2017-10-12 NOTE — Progress Notes (Signed)
Subjective:    Patient ID: Marvin Williams., male    DOB: [birth year], 72 y.o.   MRN: [id number] Chief Complaint  Patient presents with  . Follow-up    ultrasound   Patient presents to review vascular studies.  The patient was last seen on September 20, 2017 and evaluation of bilateral lower extremity claudication.  Since his initial visit, the patient has been taking a baby aspirin which he states does "help a little".  The patient still experiences intermittent claudication to the bilateral calves with long distances.  The patient denies any rest pain or ulceration to the bilateral lower extremity.  The patient underwent a bilateral ABI which was notable for right: 0.68 and left: 0.84.  Abnormal great toe waveforms bilaterally.  Right lower extremity arterial duplex notable for a significant hemodynamically stenosis located at the mid to distal SFA.  Monophasic blood flow distally.  Left lower extremity duplex with biphasic blood flow through the SFA hemodynamically significant stenosis of the popliteal artery with monophasic blood flow through the anterior tibial artery and biphasic through the posterior tibial artery and peroneal artery.  The patient denies any fever, nausea vomiting.  Review of Systems  Constitutional: Negative.   HENT: Negative.   Eyes: Negative.   Respiratory: Negative.   Cardiovascular:       PAD Claudication  Gastrointestinal: Negative.   Endocrine: Negative.   Genitourinary: Negative.   Musculoskeletal: Negative.   Skin: Negative.   Allergic/Immunologic: Negative.   Neurological: Negative.   Hematological: Negative.   Psychiatric/Behavioral: Negative.       Objective:   Physical Exam  Constitutional: He is oriented to person, place, and time. He appears well-developed and well-nourished. No distress.  HENT:  Head: Normocephalic and atraumatic.  Right Ear: External ear normal.  Left Ear: External ear normal.  Eyes: Pupils are equal, round, and reactive to  light. Conjunctivae and EOM are normal.  Neck: Normal range of motion.  Cardiovascular: Normal rate, regular rhythm, normal heart sounds and intact distal pulses.  Pulses:      Radial pulses are 2+ on the right side, and 2+ on the left side.       Dorsalis pedis pulses are 1+ on the right side, and 1+ on the left side.       Posterior tibial pulses are 1+ on the right side, and 1+ on the left side.  Pulmonary/Chest: Effort normal and breath sounds normal.  Musculoskeletal: Normal range of motion. He exhibits no edema.  Neurological: He is alert and oriented to person, place, and time.  Skin: Skin is warm and dry. He is not diaphoretic.  Psychiatric: He has a normal mood and affect. His behavior is normal. Judgment and thought content normal.  Vitals reviewed.  BP (!) 164/88   Pulse (!) 54   Resp 14   Ht 5\' 8"  (1.727 m)   Wt 162 lb (73.5 kg)   BMI 24.63 kg/m   Past Medical History:  Diagnosis Date  . Cancer ([place])    skin  . Lung cancer ([place])   . Peripheral arterial disease ([place])    Social History   Socioeconomic History  . Marital status: Married    Spouse name: Not on file  . Number of children: Not on file  . Years of education: Not on file  . Highest education level: Not on file  Occupational History  . Not on file  Social Needs  . Financial resource strain: Not on file  .  Food insecurity:    Worry: Not on file    Inability: Not on file  . Transportation needs:    Medical: Not on file    Non-medical: Not on file  Tobacco Use  . Smoking status: Former Research scientist (life sciences)  . Smokeless tobacco: Never Used  . Tobacco comment: Last cigarette on 06/04/17  Substance and Sexual Activity  . Alcohol use: No    Frequency: Never  . Drug use: No  . Sexual activity: Not on file  Lifestyle  . Physical activity:    Days per week: Not on file    Minutes per session: Not on file  . Stress: Not on file  Relationships  . Social connections:    Talks on phone: Not on file    Gets  together: Not on file    Attends religious service: Not on file    Active member of club or organization: Not on file    Attends meetings of clubs or organizations: Not on file    Relationship status: Not on file  . Intimate partner violence:    Fear of current or ex partner: Not on file    Emotionally abused: Not on file    Physically abused: Not on file    Forced sexual activity: Not on file  Other Topics Concern  . Not on file  Social History Narrative  . Not on file   Past Surgical History:  Procedure Laterality Date  . BACK SURGERY    . BACK SURGERY     1971  . COLONOSCOPY     Family History  Problem Relation Age of Onset  . Cancer Mother   . Cancer Father   . Cancer Sister    No Known Allergies     Assessment & Plan:  Patient presents to review vascular studies.  The patient was last seen on September 20, 2017 and evaluation of bilateral lower extremity claudication.  Since his initial visit, the patient has been taking a baby aspirin which he states does "help a little".  The patient still experiences intermittent claudication to the bilateral calves with long distances.  The patient denies any rest pain or ulceration to the bilateral lower extremity.  The patient underwent a bilateral ABI which was notable for right: 0.68 and left: 0.84.  Abnormal great toe waveforms bilaterally.  Right lower extremity arterial duplex notable for a significant hemodynamically stenosis located at the mid to distal SFA.  Monophasic blood flow distally.  Left lower extremity duplex with biphasic blood flow through the SFA hemodynamically significant stenosis of the popliteal artery with monophasic blood flow through the anterior tibial artery and biphasic through the posterior tibial artery and peroneal artery.  The patient denies any fever, nausea vomiting.  1. PAD (peripheral artery disease) ([place]) - Stable Patient with peripheral artery disease noted to the bilateral lower extremity on ABI  duplex today The patient does experience intermittent claudication to the bilateral calves with walking long distances.  At this time he denies any rest pain or ulceration to the bilateral lower extremity I had a long discussion with the patient about the causes of peripheral artery disease and how this type of condition is progressive. I recommended the patient undergo a right lower extremity angiogram followed by left lower extremity angiogram however at this time the patient is not ready to move forward with any procedures. Patient was given information about angiograms and if he decides and changes his mind he is to call the office Patient to  continue taking baby aspirin 81 mg daily If the patient does not move forward with the procedure I will see him back in 6 months for close surveillance with an ABI and bilateral lower extremity arterial duplex I have discussed with the patient at length the risk factors for and pathogenesis of atherosclerotic disease and encouraged a healthy diet, regular exercise regimen and blood pressure / glucose control.  The patient was encouraged to call the office in the interim if he experiences any claudication like symptoms, rest pain or ulcers to his feet / toes.  - VAS [place] ABI WITH/WO TBI; Future - VAS [place] LOWER EXTREMITY ARTERIAL DUPLEX; Future  2. Claudication of lower extremity ([place]) - Stable As above  Current Outpatient Medications on File Prior to Visit  Medication Sig Dispense Refill  . aspirin EC 81 MG tablet Take 81 mg by mouth daily.    . naproxen sodium (ALEVE) 220 MG tablet Take 220 mg by mouth daily as needed.     No current facility-administered medications on file prior to visit.    There are no Patient Instructions on file for this visit. No follow-ups on file.  Greg Eckrich A Adlai Sinning, PA-C

## 2017-11-29 ENCOUNTER — Inpatient Hospital Stay: Payer: Medicare Other | Admitting: Oncology

## 2017-11-29 ENCOUNTER — Other Ambulatory Visit: Payer: Self-pay

## 2017-11-29 ENCOUNTER — Encounter: Payer: Self-pay | Admitting: Oncology

## 2017-11-29 ENCOUNTER — Inpatient Hospital Stay: Payer: Medicare Other | Attending: Oncology

## 2017-11-29 VITALS — BP 151/79 | HR 60 | Temp 96.1°F | Resp 18 | Wt 165.3 lb

## 2017-11-29 DIAGNOSIS — Z87891 Personal history of nicotine dependence: Secondary | ICD-10-CM

## 2017-11-29 DIAGNOSIS — I739 Peripheral vascular disease, unspecified: Secondary | ICD-10-CM | POA: Insufficient documentation

## 2017-11-29 DIAGNOSIS — C3491 Malignant neoplasm of unspecified part of right bronchus or lung: Secondary | ICD-10-CM

## 2017-11-29 DIAGNOSIS — E041 Nontoxic single thyroid nodule: Secondary | ICD-10-CM

## 2017-11-29 DIAGNOSIS — Z85828 Personal history of other malignant neoplasm of skin: Secondary | ICD-10-CM | POA: Diagnosis not present

## 2017-11-29 DIAGNOSIS — Z923 Personal history of irradiation: Secondary | ICD-10-CM | POA: Diagnosis not present

## 2017-11-29 DIAGNOSIS — Z809 Family history of malignant neoplasm, unspecified: Secondary | ICD-10-CM | POA: Diagnosis not present

## 2017-11-29 DIAGNOSIS — R0602 Shortness of breath: Secondary | ICD-10-CM | POA: Diagnosis not present

## 2017-11-29 DIAGNOSIS — R030 Elevated blood-pressure reading, without diagnosis of hypertension: Secondary | ICD-10-CM

## 2017-11-29 DIAGNOSIS — Z7982 Long term (current) use of aspirin: Secondary | ICD-10-CM | POA: Diagnosis not present

## 2017-11-29 LAB — CBC WITH DIFFERENTIAL/PLATELET
BASOS ABS: 0.1 10*3/uL (ref 0–0.1)
BASOS PCT: 1 %
Eosinophils Absolute: 0.4 10*3/uL (ref 0–0.7)
Eosinophils Relative: 5 %
HEMATOCRIT: 43.3 % (ref 40.0–52.0)
HEMOGLOBIN: 14.9 g/dL (ref 13.0–18.0)
LYMPHS PCT: 15 %
Lymphs Abs: 1.1 10*3/uL (ref 1.0–3.6)
MCH: 30.7 pg (ref 26.0–34.0)
MCHC: 34.4 g/dL (ref 32.0–36.0)
MCV: 89.2 fL (ref 80.0–100.0)
MONO ABS: 0.9 10*3/uL (ref 0.2–1.0)
Monocytes Relative: 13 %
NEUTROS ABS: 5 10*3/uL (ref 1.4–6.5)
NEUTROS PCT: 66 %
Platelets: 292 10*3/uL (ref 150–440)
RBC: 4.86 MIL/uL (ref 4.40–5.90)
RDW: 13.4 % (ref 11.5–14.5)
WBC: 7.5 10*3/uL (ref 3.8–10.6)

## 2017-11-29 LAB — COMPREHENSIVE METABOLIC PANEL
ALBUMIN: 3.7 g/dL (ref 3.5–5.0)
ALK PHOS: 65 U/L (ref 38–126)
ALT: 15 U/L (ref 0–44)
ANION GAP: 6 (ref 5–15)
AST: 18 U/L (ref 15–41)
BILIRUBIN TOTAL: 0.6 mg/dL (ref 0.3–1.2)
BUN: 18 mg/dL (ref 8–23)
CALCIUM: 9.5 mg/dL (ref 8.9–10.3)
CO2: 27 mmol/L (ref 22–32)
Chloride: 106 mmol/L (ref 98–111)
Creatinine, Ser: 1.2 mg/dL (ref 0.61–1.24)
GFR, EST NON AFRICAN AMERICAN: 58 mL/min — AB (ref >60)
GLUCOSE: 88 mg/dL (ref 70–99)
Potassium: 4.7 mmol/L (ref 3.5–5.1)
Sodium: 139 mmol/L (ref 135–145)
TOTAL PROTEIN: 7.3 g/dL (ref 6.5–8.1)

## 2017-11-29 NOTE — Progress Notes (Signed)
Hematology/Oncology Follow up note [facility] Telephone:([phone] Fax:(336) [phone]   Patient Care Team: Derinda Late, MD as PCP - General (Family Medicine) Telford Nab, RN as Registered Nurse  . REASON FOR VISIT Follow up for treatment of squamous lung cancer, thyroid nodule HISTORY OF PRESENTING ILLNESS:  Marvin Knappenberger. is a  72 y.o.  male follows up for management of squamous lung cancer, stage IIB (cT3,cN0 cM0) His case was discussed on tumor board.  Although he has mild asymmetric FDG activity iin the right hilum with SUV max of 2.9. This corresponds to a densely calcified hilar lymph node, consensus was reached that this is consistent with  old granulomatous disease. He is referred to Dr.Oaks to discuss about surgery.   #ill-defined right inferior lobe nodule in the thyroid, 1.7 cm by 1.1 x 1.1 cm. S/p biopsy which showed benign follicular nodule.   INTERVAL HISTORY Marvin Williams. is a 72 y.o. male who has above history reviewed by me present for follow-up of stage IIb squamous lung cancer. Patient reports doing well.  Except that recently he developed a dry cough, and also feels that breathing is shallow.  Shortness of breath with exertion. Energy level has been well.  Good appetite.  No weight loss. Review of Systems  Constitutional: Negative for chills, diaphoresis, fever, malaise/fatigue and weight loss.  HENT: Negative for congestion, ear discharge, ear pain, hearing loss, nosebleeds, sinus pain and sore throat.   Eyes: Negative for double vision, photophobia, pain, discharge and redness.  Respiratory: Positive for cough and shortness of breath. Negative for hemoptysis, sputum production and wheezing.   Cardiovascular: Negative for chest pain, palpitations, orthopnea, claudication and leg swelling.  Gastrointestinal: Negative for abdominal pain, blood in stool, constipation, diarrhea, heartburn, melena, nausea and vomiting.    Genitourinary: Negative for dysuria, flank pain, frequency, hematuria and urgency.  Musculoskeletal: Negative for back pain, myalgias and neck pain.  Skin: Negative for itching and rash.  Neurological: Negative for dizziness, tingling, tremors, focal weakness, weakness and headaches.  Endo/Heme/Allergies: Negative for environmental allergies. Does not bruise/bleed easily.  Psychiatric/Behavioral: Negative for depression and hallucinations. The patient is not nervous/anxious.     MEDICAL HISTORY:  Past Medical History:  Diagnosis Date  . Cancer ([place])    skin  . Lung cancer ([place])   . Peripheral arterial disease ([place])     SURGICAL HISTORY: Past Surgical History:  Procedure Laterality Date  . BACK SURGERY    . BACK SURGERY     1971  . COLONOSCOPY      SOCIAL HISTORY: Social History   Socioeconomic History  . Marital status: Married    Spouse name: Not on file  . Number of children: Not on file  . Years of education: Not on file  . Highest education level: Not on file  Occupational History  . Not on file  Social Needs  . Financial resource strain: Not on file  . Food insecurity:    Worry: Not on file    Inability: Not on file  . Transportation needs:    Medical: Not on file    Non-medical: Not on file  Tobacco Use  . Smoking status: Former Research scientist (life sciences)  . Smokeless tobacco: Never Used  . Tobacco comment: Last cigarette on 06/04/17  Substance and Sexual Activity  . Alcohol use: No    Frequency: Never  . Drug use: No  . Sexual activity: Not on file  Lifestyle  . Physical activity:  Days per week: Not on file    Minutes per session: Not on file  . Stress: Not on file  Relationships  . Social connections:    Talks on phone: Not on file    Gets together: Not on file    Attends religious service: Not on file    Active member of club or organization: Not on file    Attends meetings of clubs or organizations: Not on file    Relationship status: Not on file  .  Intimate partner violence:    Fear of current or ex partner: Not on file    Emotionally abused: Not on file    Physically abused: Not on file    Forced sexual activity: Not on file  Other Topics Concern  . Not on file  Social History Narrative  . Not on file    FAMILY HISTORY: Family History  Problem Relation Age of Onset  . Cancer Mother   . Cancer Father   . Cancer Sister     ALLERGIES:  has No Known Allergies.  MEDICATIONS:  Current Outpatient Medications  Medication Sig Dispense Refill  . aspirin EC 81 MG tablet Take 81 mg by mouth daily.    . naproxen sodium (ALEVE) 220 MG tablet Take 220 mg by mouth daily as needed.     No current facility-administered medications for this visit.      PHYSICAL EXAMINATION: ECOG PERFORMANCE STATUS: 0 - Asymptomatic Vitals:   11/29/17 0945  BP: (!) 151/79  Pulse: 60  Resp: 18  Temp: (!) 96.1 F (35.6 C)  SpO2: 96%   Filed Weights   11/29/17 0945  Weight: 165 lb 4.8 oz (75 kg)    Physical Exam  Constitutional: He is oriented to person, place, and time and well-developed, well-nourished, and in no distress. No distress.  HENT:  Head: Normocephalic and atraumatic.  Nose: Nose normal.  Mouth/Throat: Oropharynx is clear and moist. No oropharyngeal exudate.  Eyes: Pupils are equal, round, and reactive to light. Conjunctivae and EOM are normal. Left eye exhibits no discharge. No scleral icterus.  Neck: Normal range of motion. Neck supple. No JVD present.  Cardiovascular: Normal rate, regular rhythm and normal heart sounds. Exam reveals no friction rub.  No murmur heard. Pulmonary/Chest: Effort normal and breath sounds normal. No respiratory distress. He has no wheezes. He has no rales. He exhibits no tenderness.  Decreased breath sounds bilaterally.  Abdominal: Soft. Bowel sounds are normal. He exhibits no distension and no mass. There is no tenderness. There is no rebound.  Musculoskeletal: Normal range of motion. He exhibits  no edema or tenderness.  Lymphadenopathy:    He has no cervical adenopathy.  Neurological: He is alert and oriented to person, place, and time. No cranial nerve deficit. He exhibits normal muscle tone. Coordination normal.  Skin: Skin is warm and dry. No rash noted. He is not diaphoretic. No erythema.  Psychiatric: Affect and judgment normal.     LABORATORY DATA:  I have reviewed the data as listed Lab Results  Component Value Date   WBC 7.5 11/29/2017   HGB 14.9 11/29/2017   HCT 43.3 11/29/2017   MCV 89.2 11/29/2017   PLT 292 11/29/2017   Recent Labs    05/22/17 0940 07/26/17 0940 11/29/17 0927  NA 135 139 139  K 4.8 4.8 4.7  CL 102 103 106  CO2 25 28 27   GLUCOSE 110* 100* 88  BUN 17 17 18   CREATININE 1.13 1.16 1.20  CALCIUM  9.4 9.6 9.5  GFRNONAA >60 >60 58*  GFRAA >60 >60 >60  PROT  --  7.7 7.3  ALBUMIN  --  3.9 3.7  AST  --  15 18  ALT  --  10* 15  ALKPHOS  --  62 65  BILITOT  --  0.5 0.6       ASSESSMENT & PLAN:  Cancer Staging Squamous carcinoma of lung, right ([place]) Staging form: Lung, AJCC 8th Edition - Clinical stage from 06/16/2017: Stage IIB (cT3, cN0, cM0) - Signed by Earlie Server, MD on 06/16/2017  1. Squamous carcinoma of lung, right ([place])   2. Claudication of lower extremity ([place])    # Stage IIB lung squamous cancer s/p RT, finished at the end of February 2019. Discussed with patient that usually will repeat CT scan every 3 months for active surveillance. Given that he has developed chronic dry cough and also feels shortness of breath lately, will order a CT chest now for reevaluation.  # Claudication: former smoker, likely due to peripheral vascular disease.  Patient has been seen by vascular surgeon and recommend lower extremity angiogram.  Patient asked for my opinion and I encourage patient to follow-up with vascular surgeon and have procedure done.  Continue 81 mg of aspirin.  The patient knows to call the clinic with any problems questions or  concerns.  Return of visit: 3 months Or earlier if CT chest showed any significant finding.  Earlie Server, MD, PhD Hematology Oncology [facility] Pager- [phone] 11/29/2017

## 2017-11-29 NOTE — Progress Notes (Signed)
Patient here for follow up. Complains of increased non-productive cough.

## 2017-12-06 ENCOUNTER — Ambulatory Visit
Admission: RE | Admit: 2017-12-06 | Discharge: 2017-12-06 | Disposition: A | Discharge status: Home / Self Care | Payer: Medicare Other | Source: Ambulatory Visit | Attending: Oncology | Admitting: Oncology

## 2017-12-06 DIAGNOSIS — C3491 Malignant neoplasm of unspecified part of right bronchus or lung: Secondary | ICD-10-CM | POA: Diagnosis not present

## 2017-12-06 DIAGNOSIS — J439 Emphysema, unspecified: Secondary | ICD-10-CM | POA: Insufficient documentation

## 2017-12-06 DIAGNOSIS — C3490 Malignant neoplasm of unspecified part of unspecified bronchus or lung: Secondary | ICD-10-CM | POA: Diagnosis not present

## 2017-12-06 DIAGNOSIS — I700 Atherosclerosis of aorta: Secondary | ICD-10-CM | POA: Diagnosis not present

## 2017-12-06 MED ORDER — IOPAMIDOL (ISOVUE-300) INJECTION 61%
75.0000 mL | Freq: Once | INTRAVENOUS | Status: AC | PRN
  Administered 2017-12-06: 100 mL via INTRAVENOUS

## 2017-12-15 ENCOUNTER — Other Ambulatory Visit: Payer: Self-pay | Admitting: *Deleted

## 2017-12-15 ENCOUNTER — Other Ambulatory Visit: Payer: Self-pay

## 2017-12-15 ENCOUNTER — Ambulatory Visit
Admission: RE | Admit: 2017-12-15 | Discharge: 2017-12-15 | Disposition: A | Discharge status: Home / Self Care | Payer: Medicare Other | Source: Ambulatory Visit | Attending: Radiation Oncology | Admitting: Radiation Oncology

## 2017-12-15 VITALS — BP 145/82 | HR 61 | Temp 98.6°F | Resp 18 | Wt 166.7 lb

## 2017-12-15 DIAGNOSIS — R0609 Other forms of dyspnea: Secondary | ICD-10-CM | POA: Insufficient documentation

## 2017-12-15 DIAGNOSIS — C3491 Malignant neoplasm of unspecified part of right bronchus or lung: Secondary | ICD-10-CM

## 2017-12-15 DIAGNOSIS — R0602 Shortness of breath: Secondary | ICD-10-CM | POA: Diagnosis not present

## 2017-12-15 DIAGNOSIS — Z923 Personal history of irradiation: Secondary | ICD-10-CM | POA: Insufficient documentation

## 2017-12-15 DIAGNOSIS — C3431 Malignant neoplasm of lower lobe, right bronchus or lung: Secondary | ICD-10-CM | POA: Diagnosis not present

## 2017-12-15 DIAGNOSIS — Z08 Encounter for follow-up examination after completed treatment for malignant neoplasm: Secondary | ICD-10-CM | POA: Insufficient documentation

## 2017-12-15 NOTE — Progress Notes (Signed)
Patient reports shortness of breath today.

## 2017-12-15 NOTE — Progress Notes (Signed)
Radiation Oncology Follow up Note  Name: Marvin Williams.   Date:   12/15/2017 MRN:  [id number] DOB: Nov 20, 1945    This 72 y.o. male presents to the clinic today for four-month follow-up status post radiation therapy for stage IIB (T3 N0 M0) squamous cell carcinoma the right lower lobe.  REFERRING PROVIDER: Derinda Late, MD  HPI: patient is a 72 year old male now out 4 months having completed radiation therapy for stage IIB (T3 N0 M0) squamous cell carcinoma the right lower lobe seen today in routine follow-up he is doing well. He states he still having some problems breathing such as mild shortness of breath and dyspnea on exertion. He's also having planned stents placed in his lower extremities for occlusion. Specifically denies cough hemoptysis or chest tightness..showing interval decrease in the cavitary posterior right lower lobe lung lesion with adjacent involving radiation scarring.  COMPLICATIONS OF TREATMENT: none  FOLLOW UP COMPLIANCE: keeps appointments   PHYSICAL EXAM:  BP (!) 145/82   Pulse 61   Temp 98.6 F (37 C) (Tympanic)   Resp 18   Wt 166 lb 10.7 oz (75.6 kg)   SpO2 97%   BMI 25.34 kg/m  Well-developed well-nourished patient in NAD. HEENT reveals PERLA, EOMI, discs not visualized.  Oral cavity is clear. No oral mucosal lesions are identified. Neck is clear without evidence of cervical or supraclavicular adenopathy. Lungs are clear to A&P. Cardiac examination is essentially unremarkable with regular rate and rhythm without murmur rub or thrill. Abdomen is benign with no organomegaly or masses noted. Motor sensory and DTR levels are equal and symmetric in the upper and lower extremities. Cranial nerves II through XII are grossly intact. Proprioception is intact. No peripheral adenopathy or edema is identified. No motor or sensory levels are noted. Crude visual fields are within normal range.  RADIOLOGY RESULTS: cT scans reviewed showing to patient and compatible with  the above-stated findings  PLAN: resent time patient is doing well with excellent response to radiation therapy. I'm please was overall progress. I have suggested a pulmonary consultation to help improve some of hisbreathing issues although he declines at this time. He is having stents placed in the near future. I've explained to him if his breathing worsens he should contact me and I will set up that referral. Otherwise I've asked to see him back in 6 months for follow-up. He knows to call sooner with any concerns.  I would like to take this opportunity to thank you for allowing me to participate in the care of your patient.Noreene Filbert, MD

## 2018-02-28 ENCOUNTER — Encounter: Payer: Self-pay | Admitting: Oncology

## 2018-02-28 ENCOUNTER — Other Ambulatory Visit: Payer: Self-pay

## 2018-02-28 ENCOUNTER — Inpatient Hospital Stay: Payer: Medicare Other | Attending: Oncology

## 2018-02-28 ENCOUNTER — Inpatient Hospital Stay (HOSPITAL_BASED_OUTPATIENT_CLINIC_OR_DEPARTMENT_OTHER): Payer: Medicare Other | Admitting: Oncology

## 2018-02-28 VITALS — BP 151/81 | HR 58 | Temp 96.0°F | Resp 18 | Wt 171.4 lb

## 2018-02-28 DIAGNOSIS — Z87891 Personal history of nicotine dependence: Secondary | ICD-10-CM | POA: Insufficient documentation

## 2018-02-28 DIAGNOSIS — Z809 Family history of malignant neoplasm, unspecified: Secondary | ICD-10-CM

## 2018-02-28 DIAGNOSIS — C3491 Malignant neoplasm of unspecified part of right bronchus or lung: Secondary | ICD-10-CM | POA: Diagnosis not present

## 2018-02-28 DIAGNOSIS — E041 Nontoxic single thyroid nodule: Secondary | ICD-10-CM | POA: Insufficient documentation

## 2018-02-28 DIAGNOSIS — I739 Peripheral vascular disease, unspecified: Secondary | ICD-10-CM | POA: Insufficient documentation

## 2018-02-28 DIAGNOSIS — J439 Emphysema, unspecified: Secondary | ICD-10-CM

## 2018-02-28 LAB — CBC WITH DIFFERENTIAL/PLATELET
Basophils Absolute: 0.1 10*3/uL (ref 0–0.1)
Basophils Relative: 1 %
Eosinophils Absolute: 0.3 10*3/uL (ref 0–0.7)
Eosinophils Relative: 4 %
HEMATOCRIT: 44.7 % (ref 40.0–52.0)
HEMOGLOBIN: 15 g/dL (ref 13.0–18.0)
LYMPHS ABS: 1.1 10*3/uL (ref 1.0–3.6)
LYMPHS PCT: 15 %
MCH: 30.2 pg (ref 26.0–34.0)
MCHC: 33.5 g/dL (ref 32.0–36.0)
MCV: 90.1 fL (ref 80.0–100.0)
MONOS PCT: 12 %
Monocytes Absolute: 0.9 10*3/uL (ref 0.2–1.0)
NEUTROS ABS: 4.8 10*3/uL (ref 1.4–6.5)
NEUTROS PCT: 68 %
Platelets: 289 10*3/uL (ref 150–440)
RBC: 4.96 MIL/uL (ref 4.40–5.90)
RDW: 14.4 % (ref 11.5–14.5)
WBC: 7.1 10*3/uL (ref 3.8–10.6)

## 2018-02-28 LAB — COMPREHENSIVE METABOLIC PANEL
ALT: 20 U/L (ref 0–44)
ANION GAP: 8 (ref 5–15)
AST: 18 U/L (ref 15–41)
Albumin: 4.1 g/dL (ref 3.5–5.0)
Alkaline Phosphatase: 65 U/L (ref 38–126)
BUN: 13 mg/dL (ref 8–23)
CHLORIDE: 104 mmol/L (ref 98–111)
CO2: 27 mmol/L (ref 22–32)
Calcium: 9.4 mg/dL (ref 8.9–10.3)
Creatinine, Ser: 1.24 mg/dL (ref 0.61–1.24)
GFR calc Af Amer: >60 mL/min (ref >60)
GFR calc non Af Amer: 56 mL/min — ABNORMAL LOW (ref >60)
Glucose, Bld: 118 mg/dL — ABNORMAL HIGH (ref 70–99)
POTASSIUM: 5 mmol/L (ref 3.5–5.1)
Sodium: 139 mmol/L (ref 135–145)
Total Bilirubin: 0.3 mg/dL (ref 0.3–1.2)
Total Protein: 7.3 g/dL (ref 6.5–8.1)

## 2018-02-28 NOTE — Progress Notes (Signed)
Hematology/Oncology Follow up note [facility] Telephone:(336) [phone] Fax:(336) [phone]   Patient Care Team: Derinda Late, MD as PCP - General (Family Medicine) Telford Nab, RN as Registered Nurse  . REASON FOR VISIT Follow up for treatment of squamous lung cancer, thyroid nodule HISTORY OF PRESENTING ILLNESS:  Marvin Williams. is a  72 y.o.  male follows up for management of squamous lung cancer, stage IIB (cT3,cN0 cM0) His case was discussed on tumor board.  Although he has mild asymmetric FDG activity iin the right hilum with SUV max of 2.9. This corresponds to a densely calcified hilar lymph node, consensus was reached that this is consistent with  old granulomatous disease. He is referred to Dr.Oaks to discuss about surgery.   #ill-defined right inferior lobe nodule in the thyroid, 1.7 cm by 1.1 x 1.1 cm. S/p biopsy which showed benign follicular nodule.   INTERVAL HISTORY Marvin Williams. is a 72 y.o. male who has above history reviewed by me present for follow up of stage II B squamous lung cancer.   # Reports doing well at baseline.  # Cough, reports chronic cough, non productive except some phlemg in the morning when he wakes up. Denies fever or chills. Shortness of breath with exertion. [name]  Appetite is good. No weight loss.  . Review of Systems  Constitutional: Negative for chills, diaphoresis, fever, malaise/fatigue and weight loss.  HENT: Negative for congestion, ear discharge, ear pain, hearing loss, nosebleeds, sinus pain and sore throat.   Eyes: Negative for double vision, photophobia, pain, discharge and redness.  Respiratory: Positive for cough and shortness of breath. Negative for hemoptysis, sputum production and wheezing.   Cardiovascular: Negative for chest pain, palpitations, orthopnea, claudication and leg swelling.  Gastrointestinal: Negative for abdominal pain, blood in stool, constipation, diarrhea, heartburn, melena, nausea and  vomiting.  Genitourinary: Negative for dysuria, flank pain, frequency, hematuria and urgency.  Musculoskeletal: Negative for back pain, myalgias and neck pain.  Skin: Negative for itching and rash.  Neurological: Negative for dizziness, tingling, tremors, focal weakness, weakness and headaches.  Endo/Heme/Allergies: Negative for environmental allergies. Does not bruise/bleed easily.  Psychiatric/Behavioral: Negative for depression and hallucinations. The patient is not nervous/anxious.     MEDICAL HISTORY:  Past Medical History:  Diagnosis Date  . Cancer ([place])    skin  . Lung cancer ([place])   . Peripheral arterial disease ([place])     SURGICAL HISTORY: Past Surgical History:  Procedure Laterality Date  . BACK SURGERY    . BACK SURGERY     1971  . COLONOSCOPY      SOCIAL HISTORY: Social History   Socioeconomic History  . Marital status: Married    Spouse name: Not on file  . Number of children: Not on file  . Years of education: Not on file  . Highest education level: Not on file  Occupational History  . Not on file  Social Needs  . Financial resource strain: Not on file  . Food insecurity:    Worry: Not on file    Inability: Not on file  . Transportation needs:    Medical: Not on file    Non-medical: Not on file  Tobacco Use  . Smoking status: Former Research scientist (life sciences)  . Smokeless tobacco: Never Used  . Tobacco comment: Last cigarette on 06/04/17  Substance and Sexual Activity  . Alcohol use: No    Frequency: Never  . Drug use: No  . Sexual activity: Not on file  Lifestyle  . Physical activity:    Days per week: Not on file    Minutes per session: Not on file  . Stress: Not on file  Relationships  . Social connections:    Talks on phone: Not on file    Gets together: Not on file    Attends religious service: Not on file    Active member of club or organization: Not on file    Attends meetings of clubs or organizations: Not on file    Relationship status: Not on file    . Intimate partner violence:    Fear of current or ex partner: Not on file    Emotionally abused: Not on file    Physically abused: Not on file    Forced sexual activity: Not on file  Other Topics Concern  . Not on file  Social History Narrative  . Not on file    FAMILY HISTORY: Family History  Problem Relation Age of Onset  . Cancer Mother   . Cancer Father   . Cancer Sister     ALLERGIES:  has No Known Allergies.  MEDICATIONS:  Current Outpatient Medications  Medication Sig Dispense Refill  . naproxen sodium (ALEVE) 220 MG tablet Take 220 mg by mouth daily as needed.     No current facility-administered medications for this visit.      PHYSICAL EXAMINATION: ECOG PERFORMANCE STATUS: 0 - Asymptomatic Vitals:   02/28/18 1021 02/28/18 1026  BP: (!) 151/81   Pulse: (!) 58   Resp: 18   Temp:    SpO2:  97%   Filed Weights   02/28/18 1019  Weight: 171 lb 6.4 oz (77.7 kg)    Physical Exam  Constitutional: He is oriented to person, place, and time. No distress.  HENT:  Head: Normocephalic and atraumatic.  Nose: Nose normal.  Mouth/Throat: Oropharynx is clear and moist. No oropharyngeal exudate.  Eyes: Pupils are equal, round, and reactive to light. Conjunctivae and EOM are normal. Left eye exhibits no discharge. No scleral icterus.  Neck: Normal range of motion. Neck supple. No JVD present.  Cardiovascular: Normal rate, regular rhythm and normal heart sounds. Exam reveals no friction rub.  No murmur heard. Pulmonary/Chest: Effort normal. No respiratory distress. He has no wheezes. He has no rales. He exhibits no tenderness.  Decreased breath sounds bilaterally.  Abdominal: Soft. Bowel sounds are normal. He exhibits no distension and no mass. There is no tenderness. There is no rebound.  Musculoskeletal: Normal range of motion. He exhibits no edema or tenderness.  Lymphadenopathy:    He has no cervical adenopathy.  Neurological: He is alert and oriented to  person, place, and time. No cranial nerve deficit. He exhibits normal muscle tone. Coordination normal.  Skin: Skin is warm and dry. No rash noted. He is not diaphoretic. No erythema.  Psychiatric: Affect and judgment normal.     LABORATORY DATA:  I have reviewed the data as listed Lab Results  Component Value Date   WBC 7.1 02/28/2018   HGB 15.0 02/28/2018   HCT 44.7 02/28/2018   MCV 90.1 02/28/2018   PLT 289 02/28/2018   Recent Labs    07/26/17 0940 11/29/17 0927 02/28/18 0947  NA 139 139 139  K 4.8 4.7 5.0  CL 103 106 104  CO2 28 27 27   GLUCOSE 100* 88 118*  BUN 17 18 13   CREATININE 1.16 1.20 1.24  CALCIUM 9.6 9.5 9.4  GFRNONAA >60 58* 56*  GFRAA >60 >60 >60  PROT 7.7 7.3 7.3  ALBUMIN 3.9 3.7 4.1  AST 15 18 18   ALT 10* 15 20  ALKPHOS 62 65 65  BILITOT 0.5 0.6 0.3       ASSESSMENT & PLAN:  Cancer Staging Squamous carcinoma of lung, right ([facility]) Staging form: Lung, AJCC 8th Edition - Clinical stage from 06/16/2017: Stage IIB (cT3, cN0, cM0) - Signed by Earlie Server, MD on 06/16/2017  1. Squamous carcinoma of lung, right ([facility])   2. Claudication of lower extremity ([place])   3. Pulmonary emphysema, unspecified emphysema type ([place])    # Stage IIB lung squamous cancer s/p RT, finished at the end of February 2019. CT done in July was independently reviewed and discussed with patient.  Will check CT chest wo every 3 months for surveillance.    # Claudication: former smoker, likely due to peripheral vascular disease.  Continue 81 mg of aspirin. He has established care with Dr.Dew. He self stopped Aspirin and we discussed about importance of medical compliance.   # Cough and SOB, likely due to COPD/emphsema. Discussed about establish care with pulmonology for further evaluation. Wife sees Dr.Fleming and patient wants to be referred to Dr.Fleming.    The patient knows to call the clinic with any problems questions or concerns.  Return of visit: 3 months Or earlier if CT  chest showed any significant finding. Total face to face encounter time for this patient visit was 25 min. >50% of the time was  spent in counseling and coordination of care.  Earlie Server, MD, PhD Hematology Oncology [facility] Pager- [phone] 02/28/2018

## 2018-02-28 NOTE — Progress Notes (Signed)
Patient her for follow up. Complaints of increased productive cough, gets short of breath easily. Pt says that he is not smoking but asks "It wouldn't hurt if I started now would it?"

## 2018-03-08 ENCOUNTER — Other Ambulatory Visit: Payer: Self-pay | Admitting: Oncology

## 2018-03-08 ENCOUNTER — Ambulatory Visit
Admission: RE | Admit: 2018-03-08 | Discharge: 2018-03-08 | Disposition: A | Discharge status: Home / Self Care | Payer: Medicare Other | Source: Ambulatory Visit | Attending: Oncology | Admitting: Oncology

## 2018-03-08 DIAGNOSIS — I2510 Atherosclerotic heart disease of native coronary artery without angina pectoris: Secondary | ICD-10-CM | POA: Insufficient documentation

## 2018-03-08 DIAGNOSIS — J439 Emphysema, unspecified: Secondary | ICD-10-CM | POA: Insufficient documentation

## 2018-03-08 DIAGNOSIS — C3491 Malignant neoplasm of unspecified part of right bronchus or lung: Secondary | ICD-10-CM | POA: Insufficient documentation

## 2018-03-08 DIAGNOSIS — C3490 Malignant neoplasm of unspecified part of unspecified bronchus or lung: Secondary | ICD-10-CM | POA: Diagnosis not present

## 2018-03-08 DIAGNOSIS — J479 Bronchiectasis, uncomplicated: Secondary | ICD-10-CM | POA: Diagnosis not present

## 2018-04-14 ENCOUNTER — Ambulatory Visit (INDEPENDENT_AMBULATORY_CARE_PROVIDER_SITE_OTHER): Payer: Medicare Other

## 2018-04-14 ENCOUNTER — Encounter (INDEPENDENT_AMBULATORY_CARE_PROVIDER_SITE_OTHER): Payer: Self-pay | Admitting: Vascular Surgery

## 2018-04-14 ENCOUNTER — Ambulatory Visit (INDEPENDENT_AMBULATORY_CARE_PROVIDER_SITE_OTHER): Payer: Medicare Other | Admitting: Vascular Surgery

## 2018-04-14 VITALS — BP 181/94 | HR 58 | Resp 16 | Ht 68.0 in | Wt 176.0 lb

## 2018-04-14 DIAGNOSIS — I70213 Atherosclerosis of native arteries of extremities with intermittent claudication, bilateral legs: Secondary | ICD-10-CM | POA: Diagnosis not present

## 2018-04-14 DIAGNOSIS — I739 Peripheral vascular disease, unspecified: Secondary | ICD-10-CM | POA: Diagnosis not present

## 2018-04-14 DIAGNOSIS — C3491 Malignant neoplasm of unspecified part of right bronchus or lung: Secondary | ICD-10-CM | POA: Diagnosis not present

## 2018-04-14 NOTE — Assessment & Plan Note (Signed)
Apparently doing well from the treatment of this

## 2018-04-14 NOTE — Assessment & Plan Note (Signed)
He underwent noninvasive studies today in the office.  This would suggest bilateral SFA occlusions with monophasic flow distally.  His right ABI is 0.71 and his left ABI is 0.93.  The waveforms are fairly poor.  Recommend:  The patient has experienced increased symptoms and is now describing lifestyle limiting claudication and mild rest pain.   Given the severity of the patient's lower extremity symptoms the patient should undergo angiography and intervention.  Risk and benefits were reviewed the patient.  Indications for the procedure were reviewed.  All questions were answered, the patient agrees to proceed.  Discussed with the patient that the legs would need to be treated one at a time, and since the right leg was slightly worse I think that we could be performed first.  The patient should continue walking and begin a more formal exercise program.  The patient should continue antiplatelet therapy and aggressive treatment of the lipid abnormalities  The patient will follow up with me after the angiogram.

## 2018-04-14 NOTE — Patient Instructions (Signed)

## 2018-04-14 NOTE — Progress Notes (Signed)
MRN : [id number]  Marvin Williams. is a 72 y.o. (August 27, 1945) male who presents with chief complaint of  Chief Complaint  Patient presents with  . Follow-up    ultrasound follow up  .  History of Present Illness: Patient returns today in follow up of his peripheral arterial disease with claudication.  He reports that his symptoms are gradually getting worse.  Both legs are affected.  He can now only walk a couple of blocks before having to stop and rest.  No rest pain or ulceration.  No fevers or chills. He underwent noninvasive studies today in the office.  This would suggest bilateral SFA occlusions with monophasic flow distally.  His right ABI is 0.71 and his left ABI is 0.93.  The waveforms are fairly poor.  Current Outpatient Medications  Medication Sig Dispense Refill  . naproxen sodium (ALEVE) 220 MG tablet Take 220 mg by mouth daily as needed.     No current facility-administered medications for this visit.     Past Medical History:  Diagnosis Date  . Cancer ([place])    skin  . Lung cancer ([place])   . Peripheral arterial disease [facility])     Past Surgical History:  Procedure Laterality Date  . BACK SURGERY    . BACK SURGERY     1971  . COLONOSCOPY      Social History Social History   Tobacco Use  . Smoking status: Former Research scientist (life sciences)  . Smokeless tobacco: Never Used  . Tobacco comment: Last cigarette on 06/04/17  Substance Use Topics  . Alcohol use: No    Frequency: Never  . Drug use: No     Family History Family History  Problem Relation Age of Onset  . Cancer Mother   . Cancer Father   . Cancer Sister   no bleeding or clotting issues   No Known Allergies   REVIEW OF SYSTEMS (Negative unless checked)  Constitutional: '[]'$ Weight loss  '[]'$ Fever  '[]'$ Chills Cardiac: '[]'$ Chest pain   '[]'$ Chest pressure   '[]'$ Palpitations   '[]'$ Shortness of breath when laying flat   '[]'$ Shortness of breath at rest   '[]'$ Shortness of breath with exertion. Vascular:  '[x]'$ Pain in legs with  walking   '[]'$ Pain in legs at rest   '[]'$ Pain in legs when laying flat   '[x]'$ Claudication   '[]'$ Pain in feet when walking  '[]'$ Pain in feet at rest  '[]'$ Pain in feet when laying flat   '[]'$ History of DVT   '[]'$ Phlebitis   '[]'$ Swelling in legs   '[]'$ Varicose veins   '[]'$ Non-healing ulcers Pulmonary:   '[]'$ Uses home oxygen   '[]'$ Productive cough   '[]'$ Hemoptysis   '[]'$ Wheeze  '[]'$ COPD   '[]'$ Asthma Neurologic:  '[]'$ Dizziness  '[]'$ Blackouts   '[]'$ Seizures   '[]'$ History of stroke   '[]'$ History of TIA  '[]'$ Aphasia   '[]'$ Temporary blindness   '[]'$ Dysphagia   '[]'$ Weakness or numbness in arms   '[]'$ Weakness or numbness in legs Musculoskeletal:  '[x]'$ Arthritis   '[]'$ Joint swelling   '[x]'$ Joint pain   '[]'$ Low back pain Hematologic:  '[]'$ Easy bruising  '[]'$ Easy bleeding   '[]'$ Hypercoagulable state   '[]'$ Anemic  '[]'$ Hepatitis Gastrointestinal:  '[]'$ Blood in stool   '[]'$ Vomiting blood  '[]'$ Gastroesophageal reflux/heartburn   '[]'$ Abdominal pain Genitourinary:  '[]'$ Chronic kidney disease   '[]'$ Difficult urination  '[]'$ Frequent urination  '[]'$ Burning with urination   '[]'$ Hematuria Skin:  '[]'$ Rashes   '[]'$ Ulcers   '[]'$ Wounds Psychological:  '[]'$ History of anxiety   '[]'$  History of major depression.  Physical Examination  BP (!) 181/94 (BP Location: Right Arm)  Pulse (!) 58   Resp 16   Ht '5\' 8"'$  (1.727 m)   Wt 176 lb (79.8 kg)   BMI 26.76 kg/m  Gen:  WD/WN, NAD Head: [place]/AT, No temporalis wasting. Ear/Nose/Throat: Hearing grossly intact, nares w/o erythema or drainage Eyes: Conjunctiva clear. Sclera non-icteric Neck: Supple.  Trachea midline Pulmonary:  Good air movement, no use of accessory muscles.  Cardiac: RRR, no JVD Vascular:  Vessel Right Left  Radial Palpable Palpable                          PT  1+ palpable  1+ palpable  DP  not palpable  trace palpable   Gastrointestinal: soft, non-tender/non-distended. Musculoskeletal: M/S 5/5 throughout.  No deformity or atrophy.  No edema. Neurologic: Sensation grossly intact in extremities.  Symmetrical.  Speech is fluent.  Psychiatric: Judgment  intact, Mood & affect appropriate for pt's clinical situation. Dermatologic: No rashes or ulcers noted.  No cellulitis or open wounds.       Labs Recent Results (from the past 2160 hour(s))  Comprehensive metabolic panel     Status: Abnormal   Collection Time: 02/28/18  9:47 AM  Result Value Ref Range   Sodium 139 135 - 145 mmol/L   Potassium 5.0 3.5 - 5.1 mmol/L   Chloride 104 98 - 111 mmol/L   CO2 27 22 - 32 mmol/L   Glucose, Bld 118 (H) 70 - 99 mg/dL   BUN 13 8 - 23 mg/dL   Creatinine, Ser 1.24 0.61 - 1.24 mg/dL   Calcium 9.4 8.9 - 10.3 mg/dL   Total Protein 7.3 6.5 - 8.1 g/dL   Albumin 4.1 3.5 - 5.0 g/dL   AST 18 15 - 41 U/L   ALT 20 0 - 44 U/L   Alkaline Phosphatase 65 38 - 126 U/L   Total Bilirubin 0.3 0.3 - 1.2 mg/dL   GFR calc non Af Amer 56 (L) >60 mL/min   GFR calc Af Amer >60 >60 mL/min    Comment: (NOTE) The eGFR has been calculated using the CKD EPI equation. This calculation has not been validated in all clinical situations. eGFR's persistently <60 mL/min signify possible Chronic Kidney Disease.    Anion gap 8 5 - 15    Comment: Performed at [facility], [address] [place], [place] [address]  CBC with Differential/Platelet     Status: None   Collection Time: 02/28/18  9:47 AM  Result Value Ref Range   WBC 7.1 3.8 - 10.6 K/uL   RBC 4.96 4.40 - 5.90 MIL/uL   Hemoglobin 15.0 13.0 - 18.0 g/dL   HCT 44.7 40.0 - 52.0 %   MCV 90.1 80.0 - 100.0 fL   MCH 30.2 26.0 - 34.0 pg   MCHC 33.5 32.0 - 36.0 g/dL   RDW 14.4 11.5 - 14.5 %   Platelets 289 150 - 440 K/uL   Neutrophils Relative % 68 %   Neutro Abs 4.8 1.4 - 6.5 K/uL   Lymphocytes Relative 15 %   Lymphs Abs 1.1 1.0 - 3.6 K/uL   Monocytes Relative 12 %   Monocytes Absolute 0.9 0.2 - 1.0 K/uL   Eosinophils Relative 4 %   Eosinophils Absolute 0.3 0 - 0.7 K/uL   Basophils Relative 1 %   Basophils Absolute 0.1 0 - 0.1 K/uL    Comment: Performed at [facility], [address]  [place], [place] [address]    Radiology No  results found.  Assessment/Plan  Squamous carcinoma of lung, right (HCC) Apparently doing well from the treatment of this  Atherosclerosis of native arteries of extremity with intermittent claudication ([place]) He underwent noninvasive studies today in the office.  This would suggest bilateral SFA occlusions with monophasic flow distally.  His right ABI is 0.71 and his left ABI is 0.93.  The waveforms are fairly poor.  Recommend:  The patient has experienced increased symptoms and is now describing lifestyle limiting claudication and mild rest pain.   Given the severity of the patient's lower extremity symptoms the patient should undergo angiography and intervention.  Risk and benefits were reviewed the patient.  Indications for the procedure were reviewed.  All questions were answered, the patient agrees to proceed.  Discussed with the patient that the legs would need to be treated one at a time, and since the right leg was slightly worse I think that we could be performed first.  The patient should continue walking and begin a more formal exercise program.  The patient should continue antiplatelet therapy and aggressive treatment of the lipid abnormalities  The patient will follow up with me after the angiogram.     Leotis Pain, MD  04/14/2018 1:28 PM    This note was created with Dragon medical transcription system.  Any errors from dictation are purely unintentional

## 2018-04-18 ENCOUNTER — Encounter (INDEPENDENT_AMBULATORY_CARE_PROVIDER_SITE_OTHER): Payer: Self-pay

## 2018-04-22 ENCOUNTER — Encounter: Payer: Self-pay | Admitting: Oncology

## 2018-04-26 ENCOUNTER — Other Ambulatory Visit (INDEPENDENT_AMBULATORY_CARE_PROVIDER_SITE_OTHER): Payer: Self-pay | Admitting: Nurse Practitioner

## 2018-04-30 MED ORDER — DEXTROSE 5 % IV SOLN
2.0000 g | Freq: Once | INTRAVENOUS | Status: AC
  Administered 2018-05-01: 2 g via INTRAVENOUS
  Filled 2018-04-30: qty 20

## 2018-05-01 ENCOUNTER — Encounter
Admission: RE | Disposition: A | Discharge status: Home / Self Care | Payer: Self-pay | Source: Ambulatory Visit | Attending: Vascular Surgery

## 2018-05-01 ENCOUNTER — Ambulatory Visit
Admission: RE | Admit: 2018-05-01 | Discharge: 2018-05-01 | Disposition: A | Discharge status: Home / Self Care | Payer: Medicare Other | Source: Ambulatory Visit | Attending: Vascular Surgery | Admitting: Vascular Surgery

## 2018-05-01 ENCOUNTER — Other Ambulatory Visit: Payer: Self-pay

## 2018-05-01 ENCOUNTER — Encounter: Payer: Self-pay | Admitting: *Deleted

## 2018-05-01 DIAGNOSIS — I70213 Atherosclerosis of native arteries of extremities with intermittent claudication, bilateral legs: Secondary | ICD-10-CM | POA: Insufficient documentation

## 2018-05-01 DIAGNOSIS — Z87891 Personal history of nicotine dependence: Secondary | ICD-10-CM | POA: Insufficient documentation

## 2018-05-01 DIAGNOSIS — I70219 Atherosclerosis of native arteries of extremities with intermittent claudication, unspecified extremity: Secondary | ICD-10-CM

## 2018-05-01 DIAGNOSIS — C3491 Malignant neoplasm of unspecified part of right bronchus or lung: Secondary | ICD-10-CM | POA: Insufficient documentation

## 2018-05-01 DIAGNOSIS — Z9889 Other specified postprocedural states: Secondary | ICD-10-CM | POA: Diagnosis not present

## 2018-05-01 DIAGNOSIS — Z85828 Personal history of other malignant neoplasm of skin: Secondary | ICD-10-CM | POA: Insufficient documentation

## 2018-05-01 DIAGNOSIS — Z809 Family history of malignant neoplasm, unspecified: Secondary | ICD-10-CM | POA: Insufficient documentation

## 2018-05-01 HISTORY — PX: LOWER EXTREMITY ANGIOGRAPHY: CATH118251

## 2018-05-01 LAB — BUN: BUN: 18 mg/dL (ref 8–23)

## 2018-05-01 LAB — CREATININE, SERUM
Creatinine, Ser: 1.24 mg/dL (ref 0.61–1.24)
GFR calc Af Amer: >60 mL/min (ref >60)
GFR calc non Af Amer: 57 mL/min — ABNORMAL LOW (ref >60)

## 2018-05-01 SURGERY — LOWER EXTREMITY ANGIOGRAPHY
Anesthesia: Moderate Sedation | Site: Leg Lower | Laterality: Right

## 2018-05-01 MED ORDER — ATORVASTATIN CALCIUM 10 MG PO TABS: 10.0000 mg | ORAL_TABLET | Freq: Every day | ORAL | Status: DC

## 2018-05-01 MED ORDER — SODIUM CHLORIDE 0.9 % IV SOLN: 250.0000 mL | INTRAVENOUS | Status: DC | PRN

## 2018-05-01 MED ORDER — ATORVASTATIN CALCIUM 10 MG PO TABS: 10.0000 mg | ORAL_TABLET | Freq: Every day | ORAL | 11 refills | Status: DC

## 2018-05-01 MED ORDER — HEPARIN (PORCINE) IN NACL 1000-0.9 UT/500ML-% IV SOLN
INTRAVENOUS | Status: AC
  Filled 2018-05-01: qty 1000

## 2018-05-01 MED ORDER — CLOPIDOGREL BISULFATE 75 MG PO TABS: 75.0000 mg | ORAL_TABLET | Freq: Every day | ORAL | 11 refills | Status: DC

## 2018-05-01 MED ORDER — CLOPIDOGREL BISULFATE 75 MG PO TABS: 75.0000 mg | ORAL_TABLET | Freq: Every day | ORAL | Status: DC

## 2018-05-01 MED ORDER — ASPIRIN EC 81 MG PO TBEC: 81.0000 mg | DELAYED_RELEASE_TABLET | Freq: Every day | ORAL | 2 refills | Status: DC

## 2018-05-01 MED ORDER — FENTANYL CITRATE (PF) 100 MCG/2ML IJ SOLN
INTRAMUSCULAR | Status: AC
  Filled 2018-05-01: qty 2

## 2018-05-01 MED ORDER — HYDRALAZINE HCL 20 MG/ML IJ SOLN: 5.0000 mg | INTRAMUSCULAR | Status: DC | PRN

## 2018-05-01 MED ORDER — IOPAMIDOL (ISOVUE-300) INJECTION 61%
INTRAVENOUS | Status: DC | PRN
  Administered 2018-05-01: 40 mL via INTRAVENOUS

## 2018-05-01 MED ORDER — ASPIRIN EC 81 MG PO TBEC
DELAYED_RELEASE_TABLET | ORAL | Status: AC
  Filled 2018-05-01: qty 1

## 2018-05-01 MED ORDER — FENTANYL CITRATE (PF) 100 MCG/2ML IJ SOLN
INTRAMUSCULAR | Status: DC | PRN
  Administered 2018-05-01: 50 ug via INTRAVENOUS
  Administered 2018-05-01: 25 ug via INTRAVENOUS
  Administered 2018-05-01: 50 ug via INTRAVENOUS
  Administered 2018-05-01: 25 ug via INTRAVENOUS

## 2018-05-01 MED ORDER — HEPARIN SODIUM (PORCINE) 1000 UNIT/ML IJ SOLN
INTRAMUSCULAR | Status: DC | PRN
  Administered 2018-05-01: 5000 [IU] via INTRAVENOUS

## 2018-05-01 MED ORDER — HEPARIN SODIUM (PORCINE) 1000 UNIT/ML IJ SOLN
INTRAMUSCULAR | Status: AC
  Filled 2018-05-01: qty 1

## 2018-05-01 MED ORDER — SODIUM CHLORIDE 0.9 % IV SOLN: INTRAVENOUS | Status: DC

## 2018-05-01 MED ORDER — ONDANSETRON HCL 4 MG/2ML IJ SOLN: 4.0000 mg | Freq: Four times a day (QID) | INTRAMUSCULAR | Status: DC | PRN

## 2018-05-01 MED ORDER — SODIUM CHLORIDE 0.9% FLUSH: 3.0000 mL | INTRAVENOUS | Status: DC | PRN

## 2018-05-01 MED ORDER — HYDROMORPHONE HCL 1 MG/ML IJ SOLN: 1.0000 mg | Freq: Once | INTRAMUSCULAR | Status: DC | PRN

## 2018-05-01 MED ORDER — LIDOCAINE-EPINEPHRINE (PF) 1 %-1:200000 IJ SOLN
INTRAMUSCULAR | Status: AC
  Filled 2018-05-01: qty 10

## 2018-05-01 MED ORDER — LABETALOL HCL 5 MG/ML IV SOLN: 10.0000 mg | INTRAVENOUS | Status: DC | PRN

## 2018-05-01 MED ORDER — CLOPIDOGREL BISULFATE 75 MG PO TABS
ORAL_TABLET | ORAL | Status: AC
  Filled 2018-05-01: qty 1

## 2018-05-01 MED ORDER — SODIUM CHLORIDE 0.9 % IV SOLN
INTRAVENOUS | Status: DC
  Administered 2018-05-01: 08:00:00 via INTRAVENOUS

## 2018-05-01 MED ORDER — MIDAZOLAM HCL 2 MG/2ML IJ SOLN
INTRAMUSCULAR | Status: DC | PRN
  Administered 2018-05-01 (×2): 1 mg via INTRAVENOUS
  Administered 2018-05-01 (×2): 2 mg via INTRAVENOUS

## 2018-05-01 MED ORDER — SODIUM CHLORIDE 0.9% FLUSH: 3.0000 mL | Freq: Two times a day (BID) | INTRAVENOUS | Status: DC

## 2018-05-01 MED ORDER — MIDAZOLAM HCL 2 MG/2ML IJ SOLN
INTRAMUSCULAR | Status: AC
  Filled 2018-05-01: qty 2

## 2018-05-01 MED ORDER — ASPIRIN 81 MG PO CHEW
CHEWABLE_TABLET | ORAL | Status: AC
  Filled 2018-05-01: qty 1

## 2018-05-01 MED ORDER — ASPIRIN EC 81 MG PO TBEC
81.0000 mg | DELAYED_RELEASE_TABLET | Freq: Every day | ORAL | Status: DC
  Administered 2018-05-01: 81 mg via ORAL

## 2018-05-01 MED ORDER — ACETAMINOPHEN 325 MG PO TABS: 650.0000 mg | ORAL_TABLET | ORAL | Status: DC | PRN

## 2018-05-01 MED ORDER — MIDAZOLAM HCL 5 MG/5ML IJ SOLN
INTRAMUSCULAR | Status: AC
  Filled 2018-05-01: qty 5

## 2018-05-01 SURGICAL SUPPLY — 16 items
BALLN LUTONIX 5X220X130 (BALLOONS) ×2
BALLOON LUTONIX 5X220X130 (BALLOONS) ×1 IMPLANT
CATH BEACON 5 .038 100 VERT TP (CATHETERS) ×2 IMPLANT
CATH PIG 70CM (CATHETERS) ×2 IMPLANT
DEVICE PRESTO INFLATION (MISCELLANEOUS) ×2 IMPLANT
DEVICE STARCLOSE SE CLOSURE (Vascular Products) ×2 IMPLANT
GLIDEWIRE ADV .035X260CM (WIRE) ×2 IMPLANT
PACK ANGIOGRAPHY (CUSTOM PROCEDURE TRAY) ×2 IMPLANT
SHEATH ANL 5FRX45 (SHEATH) ×2 IMPLANT
SHEATH ANL2 6FRX45 HC (SHEATH) ×2 IMPLANT
SHEATH BRITE TIP 5FRX11 (SHEATH) ×2 IMPLANT
STENT VIABAHN 6X150X120 (Permanent Stent) ×2 IMPLANT
SYR MEDRAD MARK V 150ML (SYRINGE) ×2 IMPLANT
TUBING CONTRAST HIGH PRESS 72 (TUBING) ×2 IMPLANT
WIRE G V18X300CM (WIRE) ×2 IMPLANT
WIRE J 3MM .035X145CM (WIRE) ×2 IMPLANT

## 2018-05-01 NOTE — Op Note (Signed)
Marvin Williams  Percutaneous Study/Intervention Procedural Note   Date of Surgery: 05/01/2018  Surgeon(s):Kashina Mecum    Assistants:none  Pre-operative Diagnosis: PAD with claudication bilateral lower extremities  Post-operative diagnosis:  Same  Procedure(s) Performed:             1.  Ultrasound guidance for vascular access left femoral artery             2.  Catheter placement into right SFA from left femoral approach             3.  Aortogram and selective right lower extremity angiogram             4.  Percutaneous transluminal angioplasty of right SFA and proximal popliteal artery with 5 mm diameter by 22 cm length Lutonix drug-coated angioplasty balloon             5.   Viabahn stent placement to the mid and distal SFA down to Hunter's canal with a 6 mm diameter by 15 cm length stent for multiple areas of greater than 50% stenosis after angioplasty  6.  StarClose closure device left femoral artery  EBL: 15 cc  Contrast: 40 cc  Fluoro Time: 5.2 minutes  Moderate Conscious Sedation Time: approximately 30 minutes using 6 mg of Versed and 150 Mcg of Fentanyl              Indications:  Patient is a 72 y.o.male with disabling claudication symptoms bilaterally. The patient has noninvasive study showing reduced ABIs bilaterally worse on the right than the left. The patient is brought in for angiography for further evaluation and potential treatment. Risks and benefits are discussed and informed consent is obtained.   Procedure:  The patient was identified and appropriate procedural time out was performed.  The patient was then placed supine on the table and prepped and draped in the usual sterile fashion. Moderate conscious sedation was administered during a face to face encounter with the patient throughout the procedure with my supervision of the RN administering medicines and monitoring the patient's vital signs, pulse oximetry, telemetry and mental status  throughout from the start of the procedure until the patient was taken to the recovery room. Ultrasound was used to evaluate the left common femoral artery.  It was patent .  A digital ultrasound image was acquired.  A Seldinger needle was used to access the left common femoral artery under direct ultrasound guidance and a permanent image was performed.  A 0.035 J wire was advanced without resistance and a 5Fr sheath was placed.  Pigtail catheter was placed into the aorta and an AP aortogram was performed. This demonstrated normal renal arteries and normal aorta and iliac segments without significant stenosis. I then crossed the aortic bifurcation with the pigtail catheter and the J-wire and advanced to the right femoral head and then into the proximal superficial femoral artery. Selective right lower extremity angiogram was then performed. This demonstrated normal common femoral artery, profunda femoris artery, and proximal superficial femoral artery.  In the mid segment, the superficial femoral artery occluded and reconstituted at Hunter's canal.  The popliteal artery then normalized and there was two-vessel runoff distally with a large posterior tibial artery and a medium sized peroneal artery.  The anterior tibial artery was chronically occluded. It was felt that it was in the patient's best interest to proceed with intervention after these images to avoid a second procedure and a larger amount of contrast and fluoroscopy based off of  the findings from the initial angiogram. The patient was systemically heparinized and a 6 [place] Ansell sheath was then placed over the [employer] wire. I then used a Kumpe catheter and the advantage wire to cross the SFA occlusion without difficulty and confirm intraluminal flow in the popliteal artery just above the knee.  I then replaced the wire and proceeded with treatment.  A 5 mm diameter by 22 cm length Lutonix drug-coated angioplasty balloon was inflated to 10 atm  for 1 minute from the mid SFA down to the proximal popliteal artery.  Completion imaging showed 2 areas of stenosis of greater than 50% after angioplasty 1 in the mid SFA and 1 in the distal SFA.  I exchanged for a 0.018 wire and placed a 6 mm diameter by 15 cm length Viabahn stent to encompass both lesions.  This was postdilated with a 5 mm balloon.  Completion angiography showed only about a 10% residual stenosis in both locations with brisk flow distally. I elected to terminate the procedure. The sheath was removed and StarClose closure device was deployed in the left femoral artery with excellent hemostatic result. The patient was taken to the recovery room in stable condition having tolerated the procedure well.   Findings:               Aortogram:  Normal renal arteries, normal aorta and iliac arteries without significant stenosis.             Right lower Extremity:  Normal common femoral artery, profunda femoris artery, and proximal superficial femoral artery.  In the mid segment, the superficial femoral artery occluded and reconstituted at Hunter's canal.  The popliteal artery then normalized and there was two-vessel runoff distally with a large posterior tibial artery and a medium sized peroneal artery.  The anterior tibial artery was chronically occluded.   Disposition: Patient was taken to the recovery room in stable condition having tolerated the procedure well.  Complications: None  Marvin Williams 05/01/2018 9:40 AM   This note was created with Dragon Medical transcription system. Any errors in dictation are purely unintentional.

## 2018-05-01 NOTE — H&P (Signed)
[facility] History & Physical Update  The patient was interviewed and re-examined.  The patient's previous History and Physical has been reviewed and is unchanged.  There is no change in the plan of care. We plan to proceed with the scheduled procedure.  Leotis Pain, MD  05/01/2018, 8:10 AM

## 2018-05-01 NOTE — Discharge Instructions (Signed)
Femoral Site Care °Refer to this sheet in the next few weeks. These instructions provide you with information about caring for yourself after your procedure. Your health care provider may also give you more specific instructions. Your treatment has been planned according to current medical practices, but problems sometimes occur. Call your health care provider if you have any problems or questions after your procedure. °What can I expect after the procedure? °After your procedure, it is typical to have the following: °· Bruising at the site that usually fades within 1-2 weeks. °· Blood collecting in the tissue (hematoma) that may be painful to the touch. It should usually decrease in size and tenderness within 1-2 weeks. ° °Follow these instructions at home: ° °· Take medicines only as directed by your health care provider.  If you take Metformin, hold for 48hours after your procedure. ° °The x-ray dye causes you to pass a considerate amount of urine.  For this reason, you will be asked to drink plenty of liquids after the procedure to prevent dehydration.  You may resume you regular diet.  Avoid caffeine products.   °· You may shower 24 hours after procedure. Leave the bandage on your access site for.  You may wash around your dressing but do not rub the site, this may cause bleeding.  Pat the area dry with a clean towel. After 48hours remove bandage and leave open to air. °· Do not take baths, swim, or use a hot tub for 7 days. °· Check your insertion site every day for redness, swelling, or drainage. °· If you lose feeling or develop tingling or pain in your leg or foot after the procedure, please walk around first.  If the discomfort does not improve , contact your physician and proceed to the nearest emergency room.  Loss of feeling in your leg might mean that a blockage has formed in the artery and this can be appropriately treated.  Limit your activity for the next two days after your procedure.  Avoid  stooping, bending, heavy lifting or exertion as this may put pressure on the insertion site.  Resume normal activities in 48 hours.   °check the insertion site occasionally.  If any oozing occurs or there is apparent swelling, firm pressure over the site will prevent a bruise from forming.  You can not hurt anything by pressing directly on the site.  The pressure stops the bleeding by allowing a small clot to form.  If the bleeding continues after the pressure has been applied for more than 15 minutes, call 911 or go to the nearest emergency room.   ° °· Apply pressure to access site if you have to laugh, cough, or sneeze. °· Do not apply powder or lotion to the site. °· Limit use of stairs to twice a day for the first 2-3 days or as directed by your health care provider. °· Do not squat for the first 2-3 days or as directed by your health care provider. °· Do not lift over 10 lb (4.5 kg) for 5 days after your procedure or as directed by your health care provider. °· Ask your health care provider when it is okay to: °? Return to work or school. °? Resume usual physical activities or sports. °? Resume sexual activity. °· Do not drive home if you are discharged the same day as the procedure. Have someone else drive you. °· You may drive 48 hours after the procedure unless otherwise instructed by your health care   provider.  Do not operate machinery or power tools for 24 hours after the procedure or as directed by your health care provider.  If your procedure was done as an outpatient procedure, which means that you went home the same day as your procedure, a responsible adult should be with you for the first 24 hours after you arrive home.  Keep all follow-up visits as directed by your health care provider. This is important. Contact a health care provider if:  You have a fever.  You have chills.  You have increased bleeding from the site. Hold pressure on the site. Get help right away if:  You have  unusual pain at the site.  You have redness, warmth, or swelling at the site.  You have drainage (other than a small amount of blood on the dressing) from the site.  The site is bleeding, and the bleeding does not stop after 30 minutes of holding steady pressure on the site.  Your leg or foot becomes pale, cool, tingly, or numb. This information is not intended to replace advice given to you by your health care provider. Make sure you discuss any questions you have with your health care provider. Document Released: 01/18/2014 Document Revised: 10/23/2015 Document Reviewed: 12/04/2013 Elsevier Interactive Patient Education  2018 [place] After This sheet gives you information about how to care for yourself after your procedure. Your health care provider may also give you more specific instructions. If you have problems or questions, contact your health care provider. What can I expect after the procedure? After the procedure, it is common to have bruising and tenderness at the catheter insertion area. Follow these instructions at home: Insertion site care  Follow instructions from your health care provider about how to take care of your insertion site. Make sure you: ? Wash your hands with soap and water before you change your bandage (dressing). If soap and water are not available, use hand sanitizer. ? Change your dressing as told by your health care provider. ? Leave stitches (sutures), skin glue, or adhesive strips in place. These skin closures may need to stay in place for 2 weeks or longer. If adhesive strip edges start to loosen and curl up, you may trim the loose edges. Do not remove adhesive strips completely unless your health care provider tells you to do that.  Do not take baths, swim, or use a hot tub until your health care provider approves.  You may shower 24-48 hours after the procedure or as told by your health care provider. ? Gently wash the site  with plain soap and water. ? Pat the area dry with a clean towel. ? Do not rub the site. This may cause bleeding.  Do not apply powder or lotion to the site. Keep the site clean and dry.  Check your insertion site every day for signs of infection. Check for: ? Redness, swelling, or pain. ? Fluid or blood. ? Warmth. ? Pus or a bad smell. Activity  Rest as told by your health care provider, usually for 1-2 days.  Do not lift anything that is heavier than 10 lbs. (4.5 kg) or as told by your health care provider.  Do not drive for 24 hours if you were given a medicine to help you relax (sedative).  Do not drive or use heavy machinery while taking prescription pain medicine. General instructions  Return to your normal activities as told by your health care provider, usually in about a  week. Ask your health care provider what activities are safe for you.  If the catheter site starts bleeding, lie flat and put pressure on the site. If the bleeding does not stop, get help right away. This is a medical emergency.  Drink enough fluid to keep your urine clear or pale yellow. This helps flush the contrast dye from your body.  Take over-the-counter and prescription medicines only as told by your health care provider.  Keep all follow-up visits as told by your health care provider. This is important. Contact a health care provider if:  You have a fever or chills.  You have redness, swelling, or pain around your insertion site.  You have fluid or blood coming from your insertion site.  The insertion site feels warm to the touch.  You have pus or a bad smell coming from your insertion site.  You have bruising around the insertion site.  You notice blood collecting in the tissue around the catheter site (hematoma). The hematoma may be painful to the touch. Get help right away if:  You have severe pain at the catheter insertion area.  The catheter insertion area swells very  fast.  The catheter insertion area is bleeding, and the bleeding does not stop when you hold steady pressure on the area.  The area near or just beyond the catheter insertion site becomes pale, cool, tingly, or numb. These symptoms may represent a serious problem that is an emergency. Do not wait to see if the symptoms will go away. Get medical help right away. Call your local emergency services (911 in the [place] Do not drive yourself to the hospital. Summary  After the procedure, it is common to have bruising and tenderness at the catheter insertion area.  After the procedure, it is important to rest and drink plenty of fluids.  Do not take baths, swim, or use a hot tub until your health care provider says it is okay to do so. You may shower 24-48 hours after the procedure or as told by your health care provider.  If the catheter site starts bleeding, lie flat and put pressure on the site. If the bleeding does not stop, get help right away. This is a medical emergency. This information is not intended to replace advice given to you by your health care provider. Make sure you discuss any questions you have with your health care provider. Document Released: 12/03/2004 Document Revised: 04/21/2016 Document Reviewed: 04/21/2016 Elsevier Interactive Patient Education  [employer].

## 2018-05-03 ENCOUNTER — Other Ambulatory Visit (INDEPENDENT_AMBULATORY_CARE_PROVIDER_SITE_OTHER): Payer: Self-pay | Admitting: Vascular Surgery

## 2018-05-03 ENCOUNTER — Encounter (INDEPENDENT_AMBULATORY_CARE_PROVIDER_SITE_OTHER): Payer: Medicare Other

## 2018-05-03 DIAGNOSIS — I70213 Atherosclerosis of native arteries of extremities with intermittent claudication, bilateral legs: Secondary | ICD-10-CM

## 2018-05-03 DIAGNOSIS — Z95820 Peripheral vascular angioplasty status with implants and grafts: Secondary | ICD-10-CM

## 2018-05-08 ENCOUNTER — Ambulatory Visit
Admission: RE | Admit: 2018-05-08 | Discharge: 2018-05-08 | Disposition: A | Discharge status: Home / Self Care | Payer: Medicare Other | Source: Ambulatory Visit | Attending: Vascular Surgery | Admitting: Vascular Surgery

## 2018-05-08 ENCOUNTER — Other Ambulatory Visit (INDEPENDENT_AMBULATORY_CARE_PROVIDER_SITE_OTHER): Payer: Self-pay | Admitting: Nurse Practitioner

## 2018-05-08 ENCOUNTER — Encounter
Admission: RE | Disposition: A | Discharge status: Home / Self Care | Payer: Self-pay | Source: Ambulatory Visit | Attending: Vascular Surgery

## 2018-05-08 ENCOUNTER — Other Ambulatory Visit: Payer: Self-pay

## 2018-05-08 DIAGNOSIS — C3491 Malignant neoplasm of unspecified part of right bronchus or lung: Secondary | ICD-10-CM | POA: Diagnosis not present

## 2018-05-08 DIAGNOSIS — Z85828 Personal history of other malignant neoplasm of skin: Secondary | ICD-10-CM | POA: Diagnosis not present

## 2018-05-08 DIAGNOSIS — I70219 Atherosclerosis of native arteries of extremities with intermittent claudication, unspecified extremity: Secondary | ICD-10-CM

## 2018-05-08 DIAGNOSIS — Z87891 Personal history of nicotine dependence: Secondary | ICD-10-CM | POA: Insufficient documentation

## 2018-05-08 DIAGNOSIS — I70213 Atherosclerosis of native arteries of extremities with intermittent claudication, bilateral legs: Secondary | ICD-10-CM | POA: Insufficient documentation

## 2018-05-08 DIAGNOSIS — Z809 Family history of malignant neoplasm, unspecified: Secondary | ICD-10-CM | POA: Insufficient documentation

## 2018-05-08 HISTORY — PX: LOWER EXTREMITY ANGIOGRAPHY: CATH118251

## 2018-05-08 LAB — CREATININE, SERUM
Creatinine, Ser: 1.18 mg/dL (ref 0.61–1.24)
GFR calc Af Amer: >60 mL/min (ref >60)
GFR calc non Af Amer: >60 mL/min (ref >60)

## 2018-05-08 LAB — BUN: BUN: 12 mg/dL (ref 8–23)

## 2018-05-08 SURGERY — LOWER EXTREMITY ANGIOGRAPHY
Anesthesia: Moderate Sedation | Site: Leg Lower | Laterality: Left

## 2018-05-08 MED ORDER — CEFAZOLIN SODIUM-DEXTROSE 2-4 GM/100ML-% IV SOLN
2.0000 g | Freq: Once | INTRAVENOUS | Status: AC
  Administered 2018-05-08: 2 g via INTRAVENOUS

## 2018-05-08 MED ORDER — MIDAZOLAM HCL 5 MG/5ML IJ SOLN
INTRAMUSCULAR | Status: AC
  Filled 2018-05-08: qty 5

## 2018-05-08 MED ORDER — HYDROMORPHONE HCL 1 MG/ML IJ SOLN: 1.0000 mg | Freq: Once | INTRAMUSCULAR | Status: DC | PRN

## 2018-05-08 MED ORDER — HEPARIN SODIUM (PORCINE) 1000 UNIT/ML IJ SOLN
INTRAMUSCULAR | Status: AC
  Filled 2018-05-08: qty 1

## 2018-05-08 MED ORDER — DEXTROSE 5 % IV SOLN: 2.0000 g | Freq: Once | INTRAVENOUS | Status: DC

## 2018-05-08 MED ORDER — ONDANSETRON HCL 4 MG/2ML IJ SOLN: 4.0000 mg | Freq: Four times a day (QID) | INTRAMUSCULAR | Status: DC | PRN

## 2018-05-08 MED ORDER — LIDOCAINE-EPINEPHRINE (PF) 1 %-1:200000 IJ SOLN
INTRAMUSCULAR | Status: AC
  Filled 2018-05-08: qty 10

## 2018-05-08 MED ORDER — SODIUM CHLORIDE FLUSH 0.9 % IV SOLN
INTRAVENOUS | Status: AC
  Filled 2018-05-08: qty 50

## 2018-05-08 MED ORDER — HEPARIN (PORCINE) IN NACL 1000-0.9 UT/500ML-% IV SOLN
INTRAVENOUS | Status: AC
  Filled 2018-05-08: qty 1000

## 2018-05-08 MED ORDER — CEFAZOLIN SODIUM-DEXTROSE 2-4 GM/100ML-% IV SOLN
INTRAVENOUS | Status: AC
  Administered 2018-05-08: 2 g via INTRAVENOUS
  Filled 2018-05-08: qty 100

## 2018-05-08 MED ORDER — SODIUM CHLORIDE 0.9 % IV SOLN: INTRAVENOUS | Status: DC

## 2018-05-08 MED ORDER — IOPAMIDOL (ISOVUE-300) INJECTION 61%
INTRAVENOUS | Status: DC | PRN
  Administered 2018-05-08: 90 mL via INTRA_ARTERIAL

## 2018-05-08 MED ORDER — SODIUM CHLORIDE 0.9% FLUSH: 3.0000 mL | INTRAVENOUS | Status: DC | PRN

## 2018-05-08 MED ORDER — HYDRALAZINE HCL 20 MG/ML IJ SOLN: 5.0000 mg | INTRAMUSCULAR | Status: DC | PRN

## 2018-05-08 MED ORDER — ACETAMINOPHEN 325 MG PO TABS: 650.0000 mg | ORAL_TABLET | ORAL | Status: DC | PRN

## 2018-05-08 MED ORDER — MIDAZOLAM HCL 2 MG/2ML IJ SOLN
INTRAMUSCULAR | Status: DC | PRN
  Administered 2018-05-08: 2 mg via INTRAVENOUS
  Administered 2018-05-08 (×3): 1 mg via INTRAVENOUS

## 2018-05-08 MED ORDER — SODIUM CHLORIDE 0.9 % IV SOLN: 250.0000 mL | INTRAVENOUS | Status: DC | PRN

## 2018-05-08 MED ORDER — FENTANYL CITRATE (PF) 100 MCG/2ML IJ SOLN
INTRAMUSCULAR | Status: AC
  Filled 2018-05-08: qty 2

## 2018-05-08 MED ORDER — FENTANYL CITRATE (PF) 100 MCG/2ML IJ SOLN
INTRAMUSCULAR | Status: DC | PRN
  Administered 2018-05-08: 25 ug via INTRAVENOUS
  Administered 2018-05-08: 50 ug via INTRAVENOUS
  Administered 2018-05-08 (×2): 25 ug via INTRAVENOUS

## 2018-05-08 MED ORDER — LABETALOL HCL 5 MG/ML IV SOLN: 10.0000 mg | INTRAVENOUS | Status: DC | PRN

## 2018-05-08 MED ORDER — HEPARIN SODIUM (PORCINE) 1000 UNIT/ML IJ SOLN
INTRAMUSCULAR | Status: DC | PRN
  Administered 2018-05-08: 5000 [IU] via INTRAVENOUS

## 2018-05-08 MED ORDER — SODIUM CHLORIDE 0.9% FLUSH: 3.0000 mL | Freq: Two times a day (BID) | INTRAVENOUS | Status: DC

## 2018-05-08 SURGICAL SUPPLY — 12 items
CATH BEACON 5 .035 65 RIM TIP (CATHETERS) ×2 IMPLANT
CATH BEACON 5 .038 100 VERT TP (CATHETERS) ×2 IMPLANT
CATH PIG 70CM (CATHETERS) ×2 IMPLANT
DEVICE PRESTO INFLATION (MISCELLANEOUS) IMPLANT
DEVICE STARCLOSE SE CLOSURE (Vascular Products) ×2 IMPLANT
GLIDEWIRE ADV .035X260CM (WIRE) ×2 IMPLANT
PACK ANGIOGRAPHY (CUSTOM PROCEDURE TRAY) ×2 IMPLANT
SHEATH ANL2 6FRX45 HC (SHEATH) ×2 IMPLANT
SHEATH BRITE TIP 5FRX11 (SHEATH) ×2 IMPLANT
SYR MEDRAD MARK V 150ML (SYRINGE) ×2 IMPLANT
TUBING CONTRAST HIGH PRESS 72 (TUBING) ×2 IMPLANT
WIRE J 3MM .035X145CM (WIRE) ×2 IMPLANT

## 2018-05-08 NOTE — Discharge Instructions (Signed)
°Moderate Conscious Sedation, Adult, Care After °These instructions provide you with information about caring for yourself after your procedure. Your health care provider may also give you more specific instructions. Your treatment has been planned according to current medical practices, but problems sometimes occur. Call your health care provider if you have any problems or questions after your procedure. °What can I expect after the procedure? °After your procedure, it is common: °· To feel sleepy for several hours. °· To feel clumsy and have poor balance for several hours. °· To have poor judgment for several hours. °· To vomit if you eat too soon. ° °Follow these instructions at home: °For at least 24 hours after the procedure: ° °· Do not: °? Participate in activities where you could fall or become injured. °? Drive. °? Use heavy machinery. °? Drink alcohol. °? Take sleeping pills or medicines that cause drowsiness. °? Make important decisions or sign legal documents. °? Take care of children on your own. °· Rest. °Eating and drinking °· Follow the diet recommended by your health care provider. °· If you vomit: °? Drink water, juice, or soup when you can drink without vomiting. °? Make sure you have little or no nausea before eating solid foods. °General instructions °· Have a responsible adult stay with you until you are awake and alert. °· Take over-the-counter and prescription medicines only as told by your health care provider. °· If you smoke, do not smoke without supervision. °· Keep all follow-up visits as told by your health care provider. This is important. °Contact a health care provider if: °· You keep feeling nauseous or you keep vomiting. °· You feel light-headed. °· You develop a rash. °· You have a fever. °Get help right away if: °· You have trouble breathing. °This information is not intended to replace advice given to you by your health care provider. Make sure you discuss any questions you  have with your health care provider. °Document Released: 03/07/2013 Document Revised: 10/20/2015 Document Reviewed: 09/06/2015 °Elsevier Interactive Patient Education © 2018 [employer] Inc[employer] ° ° ° °Femoral Site Care °Refer to this sheet in the next few weeks. These instructions provide you with information about caring for yourself after your procedure. Your health care provider may also give you more specific instructions. Your treatment has been planned according to current medical practices, but problems sometimes occur. Call your health care provider if you have any problems or questions after your procedure. °What can I expect after the procedure? °After your procedure, it is typical to have the following: °· Bruising at the site that usually fades within 1-2 weeks. °· Blood collecting in the tissue (hematoma) that may be painful to the touch. It should usually decrease in size and tenderness within 1-2 weeks. ° °Follow these instructions at home: °· Take medicines only as directed by your health care provider. °· You may shower 24-48 hours after the procedure or as directed by your health care provider. Remove the bandage (dressing) and gently wash the site with plain soap and water. Pat the area dry with a clean towel. Do not rub the site, because this may cause bleeding. °· Do not take baths, swim, or use a hot tub until your health care provider approves. °· Check your insertion site every day for redness, swelling, or drainage. °· Do not apply powder or lotion to the site. °· Limit use of stairs to twice a day for the first 2-3 days or as directed by your health care   provider. °· Do not squat for the first 2-3 days or as directed by your health care provider. °· Do not lift over 10 lb (4.5 kg) for 5 days after your procedure or as directed by your health care provider. °· Ask your health care provider when it is okay to: °? Return to work or school. °? Resume usual physical activities or sports. °? Resume  sexual activity. °· Do not drive home if you are discharged the same day as the procedure. Have someone else drive you. °· You may drive 24 hours after the procedure unless otherwise instructed by your health care provider. °· Do not operate machinery or power tools for 24 hours after the procedure or as directed by your health care provider. °· If your procedure was done as an outpatient procedure, which means that you went home the same day as your procedure, a responsible adult should be with you for the first 24 hours after you arrive home. °· Keep all follow-up visits as directed by your health care provider. This is important. °Contact a health care provider if: °· You have a fever. °· You have chills. °· You have increased bleeding from the site. Hold pressure on the site. °Get help right away if: °· You have unusual pain at the site. °· You have redness, warmth, or swelling at the site. °· You have drainage (other than a small amount of blood on the dressing) from the site. °· The site is bleeding, and the bleeding does not stop after 30 minutes of holding steady pressure on the site. °· Your leg or foot becomes pale, cool, tingly, or numb. °This information is not intended to replace advice given to you by your health care provider. Make sure you discuss any questions you have with your health care provider. °Document Released: 01/18/2014 Document Revised: 10/23/2015 Document Reviewed: 12/04/2013 °Elsevier Interactive Patient Education © 2018 Elsevier Inc. ° °

## 2018-05-08 NOTE — H&P (Signed)
[facility] History & Physical Update  The patient was interviewed and re-examined.  The patient's previous History and Physical has been reviewed and is unchanged.  There is no change in the plan of care. We plan to proceed with the scheduled procedure.  Leotis Pain, MD  05/08/2018, 8:08 AM

## 2018-05-08 NOTE — Op Note (Signed)
[facility]  Percutaneous Study/Intervention Procedural Note   Date of Surgery: 05/08/2018  Surgeon(s):Sherin Murdoch    Assistants:none  Pre-operative Diagnosis: PAD with claudication bilateral lower extremities  Post-operative diagnosis:  Same  Procedure(s) Performed:             1.  Ultrasound guidance for vascular access right femoral artery             2.  Catheter placement into left SFA and left profunda femoris artery from right femoral approach             3.  Aortogram and selective left lower extremity angiogram             4.  StarClose closure device right femoral artery  EBL: 5 cc  Contrast: 90 cc  Fluoro Time: 9.7 minutes  Moderate Conscious Sedation Time: approximately 45 minutes using 5 mg of Versed and 125 Mcg of Fentanyl              Indications:  Patient is a 72 y.o.male with leg pain with activity bilaterally. The patient has noninvasive study showing reduced ABIs bilaterally worse on the right than the left.  He is already undergone right lower extremity revascularization. The patient is brought in for angiography for further evaluation and potential treatment. Risks and benefits are discussed and informed consent is obtained.   Procedure:  The patient was identified and appropriate procedural time out was performed.  The patient was then placed supine on the table and prepped and draped in the usual sterile fashion. Moderate conscious sedation was administered during a face to face encounter with the patient throughout the procedure with my supervision of the RN administering medicines and monitoring the patient's vital signs, pulse oximetry, telemetry and mental status throughout from the start of the procedure until the patient was taken to the recovery room. Ultrasound was used to evaluate the right common femoral artery.  It was patent .  A digital ultrasound image was acquired.  A Seldinger needle was used to access the right common femoral  artery under direct ultrasound guidance and a permanent image was performed.  A 0.035 J wire was advanced without resistance and a 5Fr sheath was placed.  Pigtail catheter was placed into the aorta and an AP aortogram was performed due to tortuosity of the iliac arteries to help cross the bifurcation. This demonstrated normal renal arteries and normal aorta and iliac segments without significant stenosis although the iliacs were quite tortuous. I then crossed the aortic bifurcation and advanced to the left femoral head. Selective left lower extremity angiogram was then performed. This demonstrated what appeared to be a reasonably normal femoral bifurcation with what coursed as the typical SFA going down into large collaterals around the knee and feeding the posterior tibial artery.  There was some reconstitution of the popliteal artery around the knee, but this was not really in line with the course of the SFA.  When the posterior tibial artery reconstituted it was then continuous into the foot.  The peroneal artery also appeared to provide some additional flow distally although the anterior tibial artery appeared to be occluded.  The patient was systemically heparinized and a 6 [place] Ansell sheath was then placed over the [employer] wire. I then used a Kumpe catheter and the advantage wire to navigate down into what appeared to be the SFA.  This was not really in line with the popliteal artery and had very large collateral flow as  big as the SFA down into the posterior tibial artery.  Even with direct injection in this artery, no clear and line passed from the SFA to the popliteal artery to consider revascularization was seen.  I pulled the catheter back, and then advanced into the profunda femoris artery and then well down into the profunda femoris artery perform imaging.  Selective imaging through the profunda femoris artery showed reconstitution of the popliteal artery at the knee that again was not  really in line with the large SFA collaterals we were visualizing.  It appeared there may be aberrant anatomy with some sort of persistent issue will artery or other finding but the flow was well preserved.  Any attempt at getting from the SFA to the reconstituted popliteal artery with sacrifice a collateral was large as the SFA, and would have less patency.  For this reason, no intervention was performed today. I elected to terminate the procedure. The sheath was removed and StarClose closure device was deployed in the right femoral artery with excellent hemostatic result. The patient was taken to the recovery room in stable condition having tolerated the procedure well.  Findings:               Aortogram:  Normal renal arteries, normal aorta and iliac arteries with iliac tortuosity present             Left lower Extremity:  This demonstrated what appeared to be a reasonably normal femoral bifurcation with what coursed as the typical SFA going down into large collaterals around the knee and feeding the posterior tibial artery.  There was some reconstitution of the popliteal artery around the knee, but this was not really in line with the course of the SFA.  When the posterior tibial artery reconstituted it was then continuous into the foot.  The peroneal artery also appeared to provide some additional flow distally although the anterior tibial artery appeared to be occluded.   Disposition: Patient was taken to the recovery room in stable condition having tolerated the procedure well.  Complications: None  Leotis Pain 05/08/2018 9:34 AM   This note was created with Dragon Medical transcription system. Any errors in dictation are purely unintentional.

## 2018-05-09 ENCOUNTER — Encounter: Payer: Self-pay | Admitting: Vascular Surgery

## 2018-06-05 ENCOUNTER — Ambulatory Visit: Payer: Medicare Other

## 2018-06-06 ENCOUNTER — Encounter (INDEPENDENT_AMBULATORY_CARE_PROVIDER_SITE_OTHER): Payer: Self-pay | Admitting: Nurse Practitioner

## 2018-06-06 ENCOUNTER — Ambulatory Visit (INDEPENDENT_AMBULATORY_CARE_PROVIDER_SITE_OTHER): Payer: Medicare Other | Admitting: Nurse Practitioner

## 2018-06-06 ENCOUNTER — Ambulatory Visit (INDEPENDENT_AMBULATORY_CARE_PROVIDER_SITE_OTHER): Payer: Medicare Other

## 2018-06-06 VITALS — BP 180/87 | HR 94 | Resp 16 | Ht 68.0 in | Wt 176.6 lb

## 2018-06-06 DIAGNOSIS — Z95820 Peripheral vascular angioplasty status with implants and grafts: Secondary | ICD-10-CM | POA: Diagnosis not present

## 2018-06-06 DIAGNOSIS — I739 Peripheral vascular disease, unspecified: Secondary | ICD-10-CM | POA: Diagnosis not present

## 2018-06-06 DIAGNOSIS — I70213 Atherosclerosis of native arteries of extremities with intermittent claudication, bilateral legs: Secondary | ICD-10-CM

## 2018-06-06 DIAGNOSIS — I10 Essential (primary) hypertension: Secondary | ICD-10-CM

## 2018-06-07 ENCOUNTER — Ambulatory Visit: Payer: Medicare Other | Admitting: Oncology

## 2018-06-13 ENCOUNTER — Encounter (INDEPENDENT_AMBULATORY_CARE_PROVIDER_SITE_OTHER): Payer: Self-pay | Admitting: Nurse Practitioner

## 2018-06-13 DIAGNOSIS — I10 Essential (primary) hypertension: Secondary | ICD-10-CM | POA: Insufficient documentation

## 2018-06-13 NOTE — Progress Notes (Signed)
Subjective:    Patient ID: Marvin Williams., male    DOB: 1945-07-11, 73 y.o.   MRN: [id number] Chief Complaint  Patient presents with  . Follow-up    HPI  Marvin Williams. is a 73 y.o. male presents for follow-up studies after intervention on 05/08/2018.  During the angiogram it was found that he had collaterals from the level of his SFA/popliteal to the tibials.  However this collateral was large and of to be the SFA.  Due to this large collateralization it was felt that it was best not to go for with intervention as that may have jeopardized the collateralization vessel itself.  Patient reports having some claudication still however it is tolerable.  He denies any new wounds or ulcerations.  He denies any chest pain or shortness of breath.  He denies any fever, chills, nausea, vomiting or diarrhea.  He denies any issues with his groin sticks following angiogram.  He also underwent angiogram of his left lower extremity on 05/01/2018.  Today the patient underwent bilateral ABIs which reveals an ABI of 1.11 on his right lower extremity, with an ABI 0.80 on his left.  He has a TBI of 0.32 on his right and a TBI 0.36 on his left.  His previous ABI done on 04/14/2018, was 0.71 on his right and 0.93 on his left.  Past Medical History:  Diagnosis Date  . Cancer ([place])    skin  . Lung cancer ([place])   . Peripheral arterial disease [facility])     Past Surgical History:  Procedure Laterality Date  . BACK SURGERY    . BACK SURGERY     1971  . COLONOSCOPY    . LOWER EXTREMITY ANGIOGRAPHY Right 05/01/2018   Procedure: LOWER EXTREMITY ANGIOGRAPHY;  Surgeon: Algernon Huxley, MD;  Location: [facility];  Service: Cardiovascular;  Laterality: Right;  . LOWER EXTREMITY ANGIOGRAPHY Left 05/08/2018   Procedure: LOWER EXTREMITY ANGIOGRAPHY;  Surgeon: Algernon Huxley, MD;  Location: [place] [facility];  Service: Cardiovascular;  Laterality: Left;    Social History   Socioeconomic History  . Marital  status: Married    Spouse name: Not on file  . Number of children: Not on file  . Years of education: Not on file  . Highest education level: Not on file  Occupational History  . Not on file  Social Needs  . Financial resource strain: Not on file  . Food insecurity:    Worry: Not on file    Inability: Not on file  . Transportation needs:    Medical: Not on file    Non-medical: Not on file  Tobacco Use  . Smoking status: Former Research scientist (life sciences)  . Smokeless tobacco: Never Used  . Tobacco comment: Last cigarette on 06/04/17  Substance and Sexual Activity  . Alcohol use: No    Frequency: Never  . Drug use: No  . Sexual activity: Not on file  Lifestyle  . Physical activity:    Days per week: Not on file    Minutes per session: Not on file  . Stress: Not on file  Relationships  . Social connections:    Talks on phone: Not on file    Gets together: Not on file    Attends religious service: Not on file    Active member of club or organization: Not on file    Attends meetings of clubs or organizations: Not on file    Relationship status: Not on  file  . Intimate partner violence:    Fear of current or ex partner: Not on file    Emotionally abused: Not on file    Physically abused: Not on file    Forced sexual activity: Not on file  Other Topics Concern  . Not on file  Social History Narrative  . Not on file    Family History  Problem Relation Age of Onset  . Cancer Mother   . Cancer Father   . Cancer Sister     No Known Allergies   Review of Systems   Review of Systems: Negative Unless Checked Constitutional: [] Weight loss  [] Fever  [] Chills Cardiac: [] Chest pain   []  Atrial Fibrillation  [] Palpitations   [] Shortness of breath when laying flat   [] Shortness of breath with exertion. [] Shortness of breath at rest Vascular:  [x] Pain in legs with walking   [] Pain in legs with standing [] Pain in legs when laying flat   [x] Claudication    [] Pain in feet when laying flat     [] History of DVT   [] Phlebitis   [] Swelling in legs   [] Varicose veins   [] Non-healing ulcers Pulmonary:   [] Uses home oxygen   [] Productive cough   [] Hemoptysis   [] Wheeze  [] COPD   [] Asthma Neurologic:  [] Dizziness   [] Seizures  [] Blackouts [] History of stroke   [] History of TIA  [] Aphasia   [] Temporary Blindness   [] Weakness or numbness in arm   [] Weakness or numbness in leg Musculoskeletal:   [] Joint swelling   [] Joint pain   [] Low back pain  []  History of Knee Replacement [] Arthritis [] back Surgeries  []  Spinal Stenosis    Hematologic:  [] Easy bruising  [] Easy bleeding   [] Hypercoagulable state   [] Anemic Gastrointestinal:  [] Diarrhea   [] Vomiting  [] Gastroesophageal reflux/heartburn   [] Difficulty swallowing. [] Abdominal pain Genitourinary:  [] Chronic kidney disease   [] Difficult urination  [] Anuric   [] Blood in urine [] Frequent urination  [] Burning with urination   [] Hematuria Skin:  [] Rashes   [] Ulcers [] Wounds Psychological:  [] History of anxiety   []  History of major depression  []  Memory Difficulties     Objective:   Physical Exam  BP (!) 180/87 (BP Location: Left Arm, Patient Position: Sitting, Cuff Size: Normal)   Pulse 94   Resp 16   Ht 5\' 8"  (1.727 m)   Wt 176 lb 9.6 oz (80.1 kg)   BMI 26.85 kg/m   Gen: WD/WN, NAD Head: [place]/AT, No temporalis wasting.  Ear/Nose/Throat: Hearing grossly intact, nares w/o erythema or drainage Eyes: PER, EOMI, sclera nonicteric.  Neck: Supple, no masses.  No JVD.  Pulmonary:  Good air movement, no use of accessory muscles.  Cardiac: RRR Vascular:  Vessel Right Left  Radial Palpable Palpable  Dorsalis Pedis Palpable  not palpable  Posterior Tibial Palpable Not Palpable   Gastrointestinal: soft, non-distended. No guarding/no peritoneal signs.  Musculoskeletal: M/S 5/5 throughout.  No deformity or atrophy.  Neurologic: Pain and light touch intact in extremities.  Symmetrical.  Speech is fluent. Motor exam as listed above. Psychiatric:  Judgment intact, Mood & affect appropriate for pt's clinical situation. Dermatologic: No Venous rashes. No Ulcers Noted.  No changes consistent with cellulitis. Lymph : No Cervical lymphadenopathy, no lichenification or skin changes of chronic lymphedema.      Assessment & Plan:   1. PAD (peripheral artery disease) ([place]) Today the patient underwent bilateral ABIs which reveals an ABI of 1.11 on his right lower extremity, with an ABI 0.80 on his left.  He has a TBI of 0.32 on his right and a TBI 0.36 on his left.  His previous ABI done on 04/14/2018, was 0.71 on his right and 0.93 on his left.   The patient endorses continuing to have some claudication-like symptoms.  The patient has aberrant anatomy with large collaterals that made it difficult to cross into the left anterior tibial artery occlusion.  The patient's claudication is tolerable and with no wounds or ulcerations, we will continue to monitor his peripheral artery disease.  He will follow-up with noninvasive studies in 3 months. - VAS [place] ABI WITH/WO TBI; Future  2. Essential hypertension The patient is somewhat hypertensive today, and he notes himself that his last few doctor visits his blood pressure has been elevated.  The patient states that he is never taken any blood pressure medicine before.  I have urged the patient to visit his primary care physician to determine whether he needs chronic management of his hypertension or if it could be due to another cause.  Patient is hesitant because he does not wish to take medications for hypertension, however he stated that he would reach out to his primary care provider.   Current Outpatient Medications on File Prior to Visit  Medication Sig Dispense Refill  . aspirin EC 81 MG tablet Take 1 tablet (81 mg total) by mouth daily. 150 tablet 2  . atorvastatin (LIPITOR) 10 MG tablet Take 1 tablet (10 mg total) by mouth daily. 30 tablet 11  . clopidogrel (PLAVIX) 75 MG tablet Take 1 tablet  (75 mg total) by mouth daily. 30 tablet 11   No current facility-administered medications on file prior to visit.     There are no Patient Instructions on file for this visit. No follow-ups on file.   Kris Hartmann, NP  This note was completed with Sales executive.  Any errors are purely unintentional.

## 2018-06-19 ENCOUNTER — Ambulatory Visit: Payer: Medicare Other | Admitting: Oncology

## 2018-07-06 ENCOUNTER — Ambulatory Visit
Admission: RE | Admit: 2018-07-06 | Discharge: 2018-07-06 | Disposition: A | Discharge status: Home / Self Care | Payer: Medicare Other | Source: Ambulatory Visit | Attending: Radiation Oncology | Admitting: Radiation Oncology

## 2018-07-06 DIAGNOSIS — C3491 Malignant neoplasm of unspecified part of right bronchus or lung: Secondary | ICD-10-CM | POA: Diagnosis not present

## 2018-07-06 LAB — POCT I-STAT CREATININE: Creatinine, Ser: 1.3 mg/dL — ABNORMAL HIGH (ref 0.61–1.24)

## 2018-07-06 MED ORDER — IOPAMIDOL (ISOVUE-300) INJECTION 61%
75.0000 mL | Freq: Once | INTRAVENOUS | Status: AC | PRN
  Administered 2018-07-06: 75 mL via INTRAVENOUS

## 2018-07-07 ENCOUNTER — Encounter (INDEPENDENT_AMBULATORY_CARE_PROVIDER_SITE_OTHER): Payer: Self-pay

## 2018-07-10 ENCOUNTER — Other Ambulatory Visit: Payer: Self-pay

## 2018-07-10 ENCOUNTER — Inpatient Hospital Stay: Payer: Medicare Other | Attending: Oncology | Admitting: Oncology

## 2018-07-10 ENCOUNTER — Encounter: Payer: Self-pay | Admitting: Oncology

## 2018-07-10 ENCOUNTER — Ambulatory Visit
Admission: RE | Admit: 2018-07-10 | Discharge: 2018-07-10 | Disposition: A | Discharge status: Home / Self Care | Payer: Medicare Other | Source: Ambulatory Visit | Attending: Radiation Oncology | Admitting: Radiation Oncology

## 2018-07-10 VITALS — BP 155/81 | HR 73 | Temp 96.3°F | Resp 18 | Wt 178.8 lb

## 2018-07-10 DIAGNOSIS — F17210 Nicotine dependence, cigarettes, uncomplicated: Secondary | ICD-10-CM | POA: Insufficient documentation

## 2018-07-10 DIAGNOSIS — Z87891 Personal history of nicotine dependence: Secondary | ICD-10-CM

## 2018-07-10 DIAGNOSIS — I739 Peripheral vascular disease, unspecified: Secondary | ICD-10-CM | POA: Insufficient documentation

## 2018-07-10 DIAGNOSIS — C3491 Malignant neoplasm of unspecified part of right bronchus or lung: Secondary | ICD-10-CM | POA: Insufficient documentation

## 2018-07-10 DIAGNOSIS — Z923 Personal history of irradiation: Secondary | ICD-10-CM | POA: Insufficient documentation

## 2018-07-10 DIAGNOSIS — Z7902 Long term (current) use of antithrombotics/antiplatelets: Secondary | ICD-10-CM

## 2018-07-10 DIAGNOSIS — Z7982 Long term (current) use of aspirin: Secondary | ICD-10-CM

## 2018-07-10 DIAGNOSIS — E041 Nontoxic single thyroid nodule: Secondary | ICD-10-CM | POA: Diagnosis not present

## 2018-07-10 DIAGNOSIS — Z85118 Personal history of other malignant neoplasm of bronchus and lung: Secondary | ICD-10-CM | POA: Insufficient documentation

## 2018-07-10 DIAGNOSIS — J439 Emphysema, unspecified: Secondary | ICD-10-CM | POA: Diagnosis not present

## 2018-07-10 DIAGNOSIS — Z79899 Other long term (current) drug therapy: Secondary | ICD-10-CM | POA: Diagnosis not present

## 2018-07-10 DIAGNOSIS — R030 Elevated blood-pressure reading, without diagnosis of hypertension: Secondary | ICD-10-CM

## 2018-07-10 NOTE — Progress Notes (Signed)
Hematology/Oncology Follow up note [facility] Telephone:(336) [phone] Fax:(336) [phone]   Patient Care Team: Derinda Late, MD as PCP - General (Family Medicine) Telford Nab, RN as Registered Nurse  . REASON FOR VISIT Follow up for treatment of squamous lung cancer, thyroid nodule HISTORY OF PRESENTING ILLNESS:  Marvin Williams. is a  73 y.o.  male follows up for management of squamous lung cancer, stage IIB (cT3,cN0 cM0) His case was discussed on tumor board.  Although he has mild asymmetric FDG activity iin the right hilum with SUV max of 2.9. This corresponds to a densely calcified hilar lymph node, consensus was reached that this is consistent with  old granulomatous disease. He is referred to Dr.Oaks to discuss about surgery.   #ill-defined right inferior lobe nodule in the thyroid, 1.7 cm by 1.1 x 1.1 cm. S/p biopsy which showed benign follicular nodule.  # 07/2017 finished SBRT.   INTERVAL HISTORY Marvin Williams. is a 73 y.o. male who has above history reviewed by me present for follow up of stage II B squamous lung cancer.  Status post SBRT, finished at the end of February 2019. Reports doing well at baseline. He has chronic cough and shortness of breath with exertion. Appetite is good denies any weight loss.  . Review of Systems  Constitutional: Negative for chills, fever, malaise/fatigue and weight loss.  HENT: Negative for sore throat.   Eyes: Negative for redness.  Respiratory: Positive for cough and shortness of breath. Negative for wheezing.   Cardiovascular: Negative for chest pain, palpitations and leg swelling.  Gastrointestinal: Negative for abdominal pain, blood in stool, nausea and vomiting.  Genitourinary: Negative for dysuria.  Musculoskeletal: Negative for myalgias.  Skin: Negative for rash.  Neurological: Negative for dizziness, tingling and tremors.  Endo/Heme/Allergies: Does not bruise/bleed easily.    Psychiatric/Behavioral: Negative for hallucinations.    MEDICAL HISTORY:  Past Medical History:  Diagnosis Date  . Cancer ([place])    skin  . Lung cancer ([place])   . Peripheral arterial disease ([place])     SURGICAL HISTORY: Past Surgical History:  Procedure Laterality Date  . BACK SURGERY    . BACK SURGERY     1971  . COLONOSCOPY    . LOWER EXTREMITY ANGIOGRAPHY Right 05/01/2018   Procedure: LOWER EXTREMITY ANGIOGRAPHY;  Surgeon: Algernon Huxley, MD;  Location: [place] [facility];  Service: Cardiovascular;  Laterality: Right;  . LOWER EXTREMITY ANGIOGRAPHY Left 05/08/2018   Procedure: LOWER EXTREMITY ANGIOGRAPHY;  Surgeon: Algernon Huxley, MD;  Location: [place] [facility];  Service: Cardiovascular;  Laterality: Left;    SOCIAL HISTORY: Social History   Socioeconomic History  . Marital status: Married    Spouse name: Not on file  . Number of children: Not on file  . Years of education: Not on file  . Highest education level: Not on file  Occupational History  . Not on file  Social Needs  . Financial resource strain: Not on file  . Food insecurity:    Worry: Not on file    Inability: Not on file  . Transportation needs:    Medical: Not on file    Non-medical: Not on file  Tobacco Use  . Smoking status: Former Research scientist (life sciences)  . Smokeless tobacco: Never Used  . Tobacco comment: Last cigarette on 06/04/17  Substance and Sexual Activity  . Alcohol use: No    Frequency: Never  . Drug use: No  . Sexual activity: Not on file  Lifestyle  . Physical activity:    Days per week: Not on file    Minutes per session: Not on file  . Stress: Not on file  Relationships  . Social connections:    Talks on phone: Not on file    Gets together: Not on file    Attends religious service: Not on file    Active member of club or organization: Not on file    Attends meetings of clubs or organizations: Not on file    Relationship status: Not on file  . Intimate partner violence:    Fear of current  or ex partner: Not on file    Emotionally abused: Not on file    Physically abused: Not on file    Forced sexual activity: Not on file  Other Topics Concern  . Not on file  Social History Narrative  . Not on file    FAMILY HISTORY: Family History  Problem Relation Age of Onset  . Cancer Mother   . Cancer Father   . Cancer Sister     ALLERGIES:  has No Known Allergies.  MEDICATIONS:  Current Outpatient Medications  Medication Sig Dispense Refill  . aspirin EC 81 MG tablet Take 1 tablet (81 mg total) by mouth daily. 150 tablet 2  . atorvastatin (LIPITOR) 10 MG tablet Take 1 tablet (10 mg total) by mouth daily. 30 tablet 11  . clopidogrel (PLAVIX) 75 MG tablet Take 1 tablet (75 mg total) by mouth daily. 30 tablet 11   No current facility-administered medications for this visit.      PHYSICAL EXAMINATION: ECOG PERFORMANCE STATUS: 1 - Symptomatic but completely ambulatory Vitals:   07/10/18 1017  BP: (!) 155/81  Pulse: 73  Resp: 18  Temp: (!) 96.3 F (35.7 C)  SpO2: 98%   Filed Weights   07/10/18 1017  Weight: 178 lb 12.8 oz (81.1 kg)    Physical Exam  Constitutional: He is oriented to person, place, and time. No distress.  HENT:  Head: Normocephalic and atraumatic.  Nose: Nose normal.  Mouth/Throat: Oropharynx is clear and moist. No oropharyngeal exudate.  Eyes: Pupils are equal, round, and reactive to light. Conjunctivae and EOM are normal. Left eye exhibits no discharge. No scleral icterus.  Neck: Normal range of motion. Neck supple. No JVD present.  Cardiovascular: Normal rate, regular rhythm and normal heart sounds. Exam reveals no friction rub.  No murmur heard. Pulmonary/Chest: Effort normal. No respiratory distress. He has no wheezes. He has no rales. He exhibits no tenderness.  Decreased breath sounds bilaterally.  Abdominal: Soft. Bowel sounds are normal. He exhibits no distension and no mass. There is no abdominal tenderness. There is no rebound.    Musculoskeletal: Normal range of motion.        General: No tenderness or edema.  Lymphadenopathy:    He has no cervical adenopathy.  Neurological: He is alert and oriented to person, place, and time. No cranial nerve deficit. He exhibits normal muscle tone. Coordination normal.  Skin: Skin is warm and dry. No rash noted. He is not diaphoretic. No erythema.  Psychiatric: Affect and judgment normal.     LABORATORY DATA:  I have reviewed the data as listed Lab Results  Component Value Date   WBC 7.1 02/28/2018   HGB 15.0 02/28/2018   HCT 44.7 02/28/2018   MCV 90.1 02/28/2018   PLT 289 02/28/2018   Recent Labs    07/26/17 0940 11/29/17 0927 02/28/18 0947 05/01/18 0729 05/08/18 0703 07/06/18  1111  NA 139 139 139  --   --   --   K 4.8 4.7 5.0  --   --   --   CL 103 106 104  --   --   --   CO2 28 27 27   --   --   --   GLUCOSE 100* 88 118*  --   --   --   BUN 17 18 13 18 12   --   CREATININE 1.16 1.20 1.24 1.24 1.18 1.30*  CALCIUM 9.6 9.5 9.4  --   --   --   GFRNONAA >60 58* 56* 57* >60  --   GFRAA >60 >60 >60 >60 >60  --   PROT 7.7 7.3 7.3  --   --   --   ALBUMIN 3.9 3.7 4.1  --   --   --   AST 15 18 18   --   --   --   ALT 10* 15 20  --   --   --   ALKPHOS 62 65 65  --   --   --   BILITOT 0.5 0.6 0.3  --   --   --    RADIOGRAPHIC STUDIES: I have personally reviewed the radiological images as listed and agreed with the findings in the report. 07/06/2018 CT chest with contrast Showed continued reduction in right lower lobe lung mass.  Continue evaluation of surrounding radiation changes.  No evidence of metastatic disease in the chest.  ASSESSMENT & PLAN:  Cancer Staging Squamous carcinoma of lung, right ([place]) Staging form: Lung, AJCC 8th Edition - Clinical stage from 06/16/2017: Stage IIB (cT3, cN0, cM0) - Signed by Earlie Server, MD on 06/16/2017  1. Squamous carcinoma of lung, right (HCC)   2. Pulmonary emphysema, unspecified emphysema type (HCC)   3. Claudication (HCC)    4. Elevated blood pressure reading    # Stage IIB lung squamous cancer s/p RT, finished at the end of February 2019. CT scan was independently reviewed and discussed with patient. Stable disease. Recommend continue CT surveillance every 3 months.  #Claudication secondary to peripheral vascular disease.  Continue aspirin and Plavix. #Cough and shortness of breath, likely due to COPD/emphysema.  Has not been following up with PCP or pulmonology.  Last visit, we discussed about referring him to pulmonology Dr. Raul Del.  Patient had an appointment scheduled and called and canceled it.  He has not rescheduled it yet..Patient reports not having PCP physician.  Will refer to about family medicine. #Elevated blood pressure, advised patient to establish care with care physician for further evaluation.  Low-salt diet.  The patient knows to call the clinic with any problems questions or concerns. Orders Placed This Encounter  Procedures  . CT Chest Wo Contrast    Standing Status:   Future    Standing Expiration Date:   07/10/2019    Order Specific Question:   ** REASON FOR EXAM (FREE TEXT)    Answer:   Stage IIB lung cacner s/p RT. follow up    Order Specific Question:   Preferred imaging location?    Answer:   [facility]    Order Specific Question:   Radiology Contrast Protocol - do NOT remove file path    Answer:   [email or web address]  . CBC with Differential/Platelet    Standing Status:   Future    Standing Expiration Date:   07/11/2019  . Comprehensive metabolic panel    Standing Status:   Future  Standing Expiration Date:   07/11/2019  . Ambulatory referral to Internal Medicine    Referral Priority:   Routine    Referral Type:   Consultation    Referral Reason:   Specialty Services Required    Referred to Provider:   Venia Carbon, MD    Requested Specialty:   Internal Medicine    Number of Visits Requested:   1    Return of visit: 3 months for  reevaluation and discuss CT scanning   Earlie Server, MD, PhD Hematology Oncology [facility] Pager- [phone] 07/10/2018

## 2018-07-10 NOTE — Progress Notes (Signed)
Radiation Oncology Follow up Note  Name: Marvin Williams.   Date:   07/10/2018 MRN:  [id number] DOB: [birth year]    This 73 y.o. male presents to the clinic today for ten-month follow-up status post radiation therapy for stage IIb squamous cell carcinoma the right lower lobe.  REFERRING PROVIDER: Derinda Late, MD  HPI: patient is a 73 year old male now 10 months having completed radiation therapy to his right lower lobe for stage IIB (T3 N0 M0) squamous cell carcinoma the right lower lobe. Seen today in routine follow-up he is doing fairly well still some persistent mildly productive cough no hemoptysis. He is scheduled to see pulmonology to to his pulmonary functions..he has had recently procedure for claudication his bilateral lower extremities.he recently and a CT scan of his chest showing continuing reduction right lower lobe mass and continued evolution of surrounding radiation changes. No evidence of metastatic disease in the chest is noted.  COMPLICATIONS OF TREATMENT: none  FOLLOW UP COMPLIANCE: keeps appointments   PHYSICAL EXAM:  There were no vitals taken for this visit. Well-developed well-nourished patient in NAD. HEENT reveals PERLA, EOMI, discs not visualized.  Oral cavity is clear. No oral mucosal lesions are identified. Neck is clear without evidence of cervical or supraclavicular adenopathy. Lungs are clear to A&P. Cardiac examination is essentially unremarkable with regular rate and rhythm without murmur rub or thrill. Abdomen is benign with no organomegaly or masses noted. Motor sensory and DTR levels are equal and symmetric in the upper and lower extremities. Cranial nerves II through XII are grossly intact. Proprioception is intact. No peripheral adenopathy or edema is identified. No motor or sensory levels are noted. Crude visual fields are within normal range.  RADIOLOGY RESULTS: serial CT scans are reviewed and compatible above-stated findings  PLAN: present time  patient is doing well with no evidence of disease. I'm please was overall progress. I agree with pulmonary consultation to try to tune up his lung functions. I've also suggested someMucinex to help him with his cough and secretions. I've asked to see him back in 6 months for follow-up. Patient knows to call sooner with any concerns at any time.  I would like to take this opportunity to thank you for allowing me to participate in the care of your patient.Noreene Filbert, MD

## 2018-07-10 NOTE — Progress Notes (Signed)
Patient here for follow up. Pt complains of increased shortness of breath with slight exertion.

## 2018-08-28 ENCOUNTER — Encounter (INDEPENDENT_AMBULATORY_CARE_PROVIDER_SITE_OTHER): Payer: Self-pay | Admitting: Nurse Practitioner

## 2018-08-28 ENCOUNTER — Other Ambulatory Visit: Payer: Self-pay

## 2018-08-28 ENCOUNTER — Other Ambulatory Visit (INDEPENDENT_AMBULATORY_CARE_PROVIDER_SITE_OTHER): Payer: Medicare Other

## 2018-08-28 ENCOUNTER — Ambulatory Visit (INDEPENDENT_AMBULATORY_CARE_PROVIDER_SITE_OTHER): Payer: Medicare Other | Admitting: Nurse Practitioner

## 2018-08-28 VITALS — BP 162/95 | HR 67 | Resp 16 | Ht 69.0 in | Wt 181.0 lb

## 2018-08-28 DIAGNOSIS — I10 Essential (primary) hypertension: Secondary | ICD-10-CM

## 2018-08-28 DIAGNOSIS — Z79899 Other long term (current) drug therapy: Secondary | ICD-10-CM

## 2018-08-28 DIAGNOSIS — Z87891 Personal history of nicotine dependence: Secondary | ICD-10-CM

## 2018-08-28 DIAGNOSIS — I70213 Atherosclerosis of native arteries of extremities with intermittent claudication, bilateral legs: Secondary | ICD-10-CM | POA: Diagnosis not present

## 2018-08-28 DIAGNOSIS — I739 Peripheral vascular disease, unspecified: Secondary | ICD-10-CM

## 2018-08-28 NOTE — Progress Notes (Signed)
SUBJECTIVE:  Patient ID: Marvin Skeeter., male    DOB: 1945/11/24, 73 y.o.   MRN: [id number] Chief Complaint  Patient presents with  . Follow-up    81month abi    HPI  Marvin Williams. is a 73 y.o. male The patient returns to the office for followup and review of the noninvasive studies. There have been no interval changes in lower extremity symptoms. No interval shortening of the patient's claudication distance or development of rest pain symptoms. No new ulcers or wounds have occurred since the last visit.  The patient also stopped taking his aspirin due to bruising.  There have been no significant changes to the patient's overall health care.  The patient denies amaurosis fugax or recent TIA symptoms. There are no recent neurological changes noted. The patient denies history of DVT, PE or superficial thrombophlebitis. The patient denies recent episodes of angina or shortness of breath.   ABI Rt=1.03 and Lt=0.86  (previous ABI's Rt=1.11 and Lt=0.80) Duplex ultrasound of the tibial arteries of the right lower extremity revealed triphasic waveforms, the left tibial arteries have biphasic waveforms.  The bilateral TBI's have increased compared to the prior study on 06/06/2018.  Toe waveforms are also strong bilaterally.  Past Medical History:  Diagnosis Date  . Cancer ([place])    skin  . Lung cancer ([place])   . Peripheral arterial disease [facility])     Past Surgical History:  Procedure Laterality Date  . BACK SURGERY    . BACK SURGERY     1971  . COLONOSCOPY    . LOWER EXTREMITY ANGIOGRAPHY Right 05/01/2018   Procedure: LOWER EXTREMITY ANGIOGRAPHY;  Surgeon: Algernon Huxley, MD;  Location: [place] [facility];  Service: Cardiovascular;  Laterality: Right;  . LOWER EXTREMITY ANGIOGRAPHY Left 05/08/2018   Procedure: LOWER EXTREMITY ANGIOGRAPHY;  Surgeon: Algernon Huxley, MD;  Location: [place] [facility];  Service: Cardiovascular;  Laterality: Left;    Social History   Socioeconomic  History  . Marital status: Married    Spouse name: Not on file  . Number of children: Not on file  . Years of education: Not on file  . Highest education level: Not on file  Occupational History  . Not on file  Social Needs  . Financial resource strain: Not on file  . Food insecurity:    Worry: Not on file    Inability: Not on file  . Transportation needs:    Medical: Not on file    Non-medical: Not on file  Tobacco Use  . Smoking status: Former Research scientist (life sciences)  . Smokeless tobacco: Never Used  . Tobacco comment: Last cigarette on 06/04/17  Substance and Sexual Activity  . Alcohol use: No    Frequency: Never  . Drug use: No  . Sexual activity: Not on file  Lifestyle  . Physical activity:    Days per week: Not on file    Minutes per session: Not on file  . Stress: Not on file  Relationships  . Social connections:    Talks on phone: Not on file    Gets together: Not on file    Attends religious service: Not on file    Active member of club or organization: Not on file    Attends meetings of clubs or organizations: Not on file    Relationship status: Not on file  . Intimate partner violence:    Fear of current or ex partner: Not on file    Emotionally  abused: Not on file    Physically abused: Not on file    Forced sexual activity: Not on file  Other Topics Concern  . Not on file  Social History Narrative  . Not on file    Family History  Problem Relation Age of Onset  . Cancer Mother   . Cancer Father   . Cancer Sister     No Known Allergies   Review of Systems   Review of Systems: Negative Unless Checked Constitutional: [] Weight loss  [] Fever  [] Chills Cardiac: [] Chest pain   []  Atrial Fibrillation  [] Palpitations   [] Shortness of breath when laying flat   [] Shortness of breath with exertion. [] Shortness of breath at rest Vascular:  [x] Pain in legs with walking   [] Pain in legs with standing [] Pain in legs when laying flat   [x] Claudication    [] Pain in feet when  laying flat    [] History of DVT   [] Phlebitis   [] Swelling in legs   [] Varicose veins   [] Non-healing ulcers Pulmonary:   [] Uses home oxygen   [] Productive cough   [] Hemoptysis   [] Wheeze  [] COPD   [] Asthma Neurologic:  [] Dizziness   [] Seizures  [] Blackouts [] History of stroke   [] History of TIA  [] Aphasia   [] Temporary Blindness   [] Weakness or numbness in arm   [] Weakness or numbness in leg Musculoskeletal:   [] Joint swelling   [] Joint pain   [] Low back pain  []  History of Knee Replacement [] Arthritis [] back Surgeries  []  Spinal Stenosis    Hematologic:  [] Easy bruising  [] Easy bleeding   [] Hypercoagulable state   [] Anemic Gastrointestinal:  [] Diarrhea   [] Vomiting  [] Gastroesophageal reflux/heartburn   [] Difficulty swallowing. [] Abdominal pain Genitourinary:  [] Chronic kidney disease   [] Difficult urination  [] Anuric   [] Blood in urine [] Frequent urination  [] Burning with urination   [] Hematuria Skin:  [] Rashes   [] Ulcers [] Wounds Psychological:  [] History of anxiety   []  History of major depression  []  Memory Difficulties      OBJECTIVE:   Physical Exam  BP (!) 162/95 (BP Location: Right Arm)   Pulse 67   Resp 16   Ht 5\' 9"  (1.753 m)   Wt 181 lb (82.1 kg)   BMI 26.73 kg/m   Gen: WD/WN, NAD Head: [place]/AT, No temporalis wasting.  Ear/Nose/Throat: Hearing grossly intact, nares w/o erythema or drainage Eyes: PER, EOMI, sclera nonicteric.  Neck: Supple, no masses.  No JVD.  Pulmonary:  Good air movement, no use of accessory muscles.  Cardiac: RRR Vascular:  Vessel Right Left  Radial Palpable Palpable  Dorsalis Pedis Palpable  not palpable  Posterior Tibial Palpable  not palpable   Gastrointestinal: soft, non-distended. No guarding/no peritoneal signs.  Musculoskeletal: M/S 5/5 throughout.  No deformity or atrophy.  Neurologic: Pain and light touch intact in extremities.  Symmetrical.  Speech is fluent. Motor exam as listed above. Psychiatric: Judgment intact, Mood & affect  appropriate for pt's clinical situation. Dermatologic: No Venous rashes. No Ulcers Noted.  No changes consistent with cellulitis. Lymph : No Cervical lymphadenopathy, no lichenification or skin changes of chronic lymphedema.       ASSESSMENT AND PLAN:  1. Atherosclerosis of native artery of both lower extremities with intermittent claudication (HCC) ABI Rt=1.03 and Lt=0.86  (previous ABI's Rt=1.11 and Lt=0.80) Duplex ultrasound of the tibial arteries of the right lower extremity revealed triphasic waveforms, the left tibial arteries have biphasic waveforms.  The bilateral TBI's have increased compared to the prior study on 06/06/2018.  Toe waveforms are also strong bilaterally.    Recommend:  The patient has evidence of atherosclerosis of the lower extremities with claudication.  The patient does not voice lifestyle limiting changes at this point in time.  Noninvasive studies do not suggest clinically significant change.  No invasive studies, angiography or surgery at this time The patient should continue walking and begin a more formal exercise program.  The patient should continue antiplatelet therapy and aggressive treatment of the lipid abnormalities  No changes in the patient's medications at this time  The patient should continue wearing graduated compression socks 10-15 mmHg strength to control the mild edema.    2. Essential hypertension Continue antihypertensive medications as already ordered, these medications have been reviewed and there are no changes at this time.    Current Outpatient Medications on File Prior to Visit  Medication Sig Dispense Refill  . atorvastatin (LIPITOR) 10 MG tablet Take 1 tablet (10 mg total) by mouth daily. 30 tablet 11  . clopidogrel (PLAVIX) 75 MG tablet Take 1 tablet (75 mg total) by mouth daily. 30 tablet 11  . aspirin EC 81 MG tablet Take 1 tablet (81 mg total) by mouth daily. (Patient not taking: Reported on 08/28/2018) 150 tablet 2   No  current facility-administered medications on file prior to visit.     There are no Patient Instructions on file for this visit. No follow-ups on file.   Kris Hartmann, NP  This note was completed with Sales executive.  Any errors are purely unintentional.

## 2018-09-05 ENCOUNTER — Ambulatory Visit (INDEPENDENT_AMBULATORY_CARE_PROVIDER_SITE_OTHER): Payer: Medicare Other | Admitting: Vascular Surgery

## 2018-09-05 ENCOUNTER — Encounter (INDEPENDENT_AMBULATORY_CARE_PROVIDER_SITE_OTHER): Payer: Medicare Other

## 2018-10-05 ENCOUNTER — Ambulatory Visit: Admission: RE | Admit: 2018-10-05 | Payer: Medicare Other | Source: Ambulatory Visit

## 2018-10-05 ENCOUNTER — Other Ambulatory Visit: Payer: Self-pay

## 2018-10-05 ENCOUNTER — Ambulatory Visit
Admission: RE | Admit: 2018-10-05 | Discharge: 2018-10-05 | Disposition: A | Discharge status: Home / Self Care | Payer: Medicare Other | Source: Ambulatory Visit | Attending: Oncology | Admitting: Oncology

## 2018-10-05 DIAGNOSIS — C3491 Malignant neoplasm of unspecified part of right bronchus or lung: Secondary | ICD-10-CM | POA: Insufficient documentation

## 2018-10-06 ENCOUNTER — Inpatient Hospital Stay: Payer: Medicare Other | Attending: Oncology

## 2018-10-06 ENCOUNTER — Other Ambulatory Visit: Payer: Self-pay

## 2018-10-06 DIAGNOSIS — C3491 Malignant neoplasm of unspecified part of right bronchus or lung: Secondary | ICD-10-CM

## 2018-10-06 DIAGNOSIS — J439 Emphysema, unspecified: Secondary | ICD-10-CM

## 2018-10-06 LAB — COMPREHENSIVE METABOLIC PANEL
ALT: 25 U/L (ref 0–44)
AST: 21 U/L (ref 15–41)
Albumin: 4.2 g/dL (ref 3.5–5.0)
Alkaline Phosphatase: 72 U/L (ref 38–126)
Anion gap: 7 (ref 5–15)
BUN: 16 mg/dL (ref 8–23)
CO2: 25 mmol/L (ref 22–32)
Calcium: 9.2 mg/dL (ref 8.9–10.3)
Chloride: 105 mmol/L (ref 98–111)
Creatinine, Ser: 1.36 mg/dL — ABNORMAL HIGH (ref 0.61–1.24)
GFR calc Af Amer: 59 mL/min — ABNORMAL LOW (ref >60)
GFR calc non Af Amer: 51 mL/min — ABNORMAL LOW (ref >60)
Glucose, Bld: 100 mg/dL — ABNORMAL HIGH (ref 70–99)
Potassium: 4.4 mmol/L (ref 3.5–5.1)
Sodium: 137 mmol/L (ref 135–145)
Total Bilirubin: 0.5 mg/dL (ref 0.3–1.2)
Total Protein: 7.6 g/dL (ref 6.5–8.1)

## 2018-10-06 LAB — CBC WITH DIFFERENTIAL/PLATELET
Abs Immature Granulocytes: 0.03 10*3/uL (ref 0.00–0.07)
Basophils Absolute: 0.1 10*3/uL (ref 0.0–0.1)
Basophils Relative: 1 %
Eosinophils Absolute: 0.3 10*3/uL (ref 0.0–0.5)
Eosinophils Relative: 3 %
HCT: 46 % (ref 39.0–52.0)
Hemoglobin: 15.1 g/dL (ref 13.0–17.0)
Immature Granulocytes: 0 %
Lymphocytes Relative: 16 %
Lymphs Abs: 1.3 10*3/uL (ref 0.7–4.0)
MCH: 28.9 pg (ref 26.0–34.0)
MCHC: 32.8 g/dL (ref 30.0–36.0)
MCV: 88 fL (ref 80.0–100.0)
Monocytes Absolute: 0.9 10*3/uL (ref 0.1–1.0)
Monocytes Relative: 11 %
Neutro Abs: 5.8 10*3/uL (ref 1.7–7.7)
Neutrophils Relative %: 69 %
Platelets: 254 10*3/uL (ref 150–400)
RBC: 5.23 MIL/uL (ref 4.22–5.81)
RDW: 13.3 % (ref 11.5–15.5)
WBC: 8.3 10*3/uL (ref 4.0–10.5)
nRBC: 0 % (ref 0.0–0.2)

## 2018-10-06 NOTE — Progress Notes (Unsigned)
Patient came in for blood work, states that the right side of his ribcage was throbbing like a toothache and had some concerns about it. He also has an elevated bp 194/100 on left arm, patient denies any blurred vision, headache.

## 2018-10-09 ENCOUNTER — Inpatient Hospital Stay (HOSPITAL_BASED_OUTPATIENT_CLINIC_OR_DEPARTMENT_OTHER): Payer: Medicare Other | Admitting: Oncology

## 2018-10-09 ENCOUNTER — Inpatient Hospital Stay: Payer: Medicare Other

## 2018-10-09 ENCOUNTER — Inpatient Hospital Stay: Payer: Medicare Other | Admitting: Oncology

## 2018-10-09 ENCOUNTER — Encounter: Payer: Self-pay | Admitting: Oncology

## 2018-10-09 ENCOUNTER — Other Ambulatory Visit: Payer: Self-pay

## 2018-10-09 DIAGNOSIS — R7989 Other specified abnormal findings of blood chemistry: Secondary | ICD-10-CM

## 2018-10-09 DIAGNOSIS — C3491 Malignant neoplasm of unspecified part of right bronchus or lung: Secondary | ICD-10-CM

## 2018-10-09 DIAGNOSIS — Z87891 Personal history of nicotine dependence: Secondary | ICD-10-CM | POA: Diagnosis not present

## 2018-10-09 DIAGNOSIS — Z7902 Long term (current) use of antithrombotics/antiplatelets: Secondary | ICD-10-CM | POA: Diagnosis not present

## 2018-10-09 DIAGNOSIS — Z79899 Other long term (current) drug therapy: Secondary | ICD-10-CM

## 2018-10-09 NOTE — Progress Notes (Signed)
HEMATOLOGY-ONCOLOGY TeleHEALTH VISIT PROGRESS NOTE  I connected with [place] on 10/09/18 at 10:15 AM EDT by video enabled telemedicine visit and verified that I am speaking with the correct person using two identifiers. I discussed the limitations, risks, security and privacy concerns of performing an evaluation and management service by telemedicine and the availability of in-person appointments. I also discussed with the patient that there may be a patient responsible charge related to this service. The patient expressed understanding and agreed to proceed.   Other persons participating in the visit and their role in the encounter:  Geraldine Solar, CMA, check in patient     Patient's location: Home  Provider's location: Home office Chief Complaint: 3 months follow up for Stage IIB Lung squamous cell carcinoma.    INTERVAL HISTORY Marvin Williams. is a 73 y.o. male who has above history reviewed by me today presents for follow up visit for management of Stage IIB Lung squamous cell carcinoma Problems and complaints are listed below:  Patient reports feeling at baseline. Denies any fever, chills, SOB, cough, chest pain or abdominal pain.  Appetite is fair and weight is stable.  He takes Naproxen a few time per week for chronic joint and body ache.    Review of Systems  Constitutional: Negative for appetite change, chills, fatigue, fever and unexpected weight change.  HENT:   Negative for hearing loss and voice change.   Eyes: Negative for eye problems and icterus.  Respiratory: Negative for chest tightness, cough and shortness of breath.   Cardiovascular: Negative for chest pain and leg swelling.  Gastrointestinal: Negative for abdominal distention and abdominal pain.  Endocrine: Negative for hot flashes.  Genitourinary: Negative for difficulty urinating, dysuria and frequency.   Musculoskeletal: Negative for arthralgias.  Skin: Negative for itching and rash.  Neurological:  Negative for light-headedness and numbness.  Hematological: Negative for adenopathy. Does not bruise/bleed easily.  Psychiatric/Behavioral: Negative for confusion.    Past Medical History:  Diagnosis Date  . Cancer ([place])    skin  . Lung cancer ([place])   . Peripheral arterial disease [facility])    Past Surgical History:  Procedure Laterality Date  . BACK SURGERY    . BACK SURGERY     1971  . COLONOSCOPY    . LOWER EXTREMITY ANGIOGRAPHY Right 05/01/2018   Procedure: LOWER EXTREMITY ANGIOGRAPHY;  Surgeon: Algernon Huxley, MD;  Location: [place] [facility];  Service: Cardiovascular;  Laterality: Right;  . LOWER EXTREMITY ANGIOGRAPHY Left 05/08/2018   Procedure: LOWER EXTREMITY ANGIOGRAPHY;  Surgeon: Algernon Huxley, MD;  Location: [place] [facility];  Service: Cardiovascular;  Laterality: Left;    Family History  Problem Relation Age of Onset  . Cancer Mother   . Cancer Father   . Cancer Sister     Social History   Socioeconomic History  . Marital status: Married    Spouse name: Not on file  . Number of children: Not on file  . Years of education: Not on file  . Highest education level: Not on file  Occupational History  . Not on file  Social Needs  . Financial resource strain: Not on file  . Food insecurity:    Worry: Not on file    Inability: Not on file  . Transportation needs:    Medical: Not on file    Non-medical: Not on file  Tobacco Use  . Smoking status: Former Research scientist (life sciences)  . Smokeless tobacco: Never Used  . Tobacco  comment: Last cigarette on 06/04/17  Substance and Sexual Activity  . Alcohol use: No    Frequency: Never  . Drug use: No  . Sexual activity: Not on file  Lifestyle  . Physical activity:    Days per week: Not on file    Minutes per session: Not on file  . Stress: Not on file  Relationships  . Social connections:    Talks on phone: Not on file    Gets together: Not on file    Attends religious service: Not on file    Active member of club or  organization: Not on file    Attends meetings of clubs or organizations: Not on file    Relationship status: Not on file  . Intimate partner violence:    Fear of current or ex partner: Not on file    Emotionally abused: Not on file    Physically abused: Not on file    Forced sexual activity: Not on file  Other Topics Concern  . Not on file  Social History Narrative  . Not on file    Current Outpatient Medications on File Prior to Visit  Medication Sig Dispense Refill  . atorvastatin (LIPITOR) 10 MG tablet Take 1 tablet (10 mg total) by mouth daily. 30 tablet 11  . clopidogrel (PLAVIX) 75 MG tablet Take 1 tablet (75 mg total) by mouth daily. 30 tablet 11   No current facility-administered medications on file prior to visit.     No Known Allergies     Observations/Objective: Today's Vitals   10/09/18 0958  PainSc: 2    There is no height or weight on file to calculate BMI.  Physical Exam  Constitutional: He is oriented to person, place, and time. No distress.  HENT:  Head: Normocephalic and atraumatic.  Pulmonary/Chest: Effort normal.  Neurological: He is oriented to person, place, and time.  Psychiatric: Affect normal.    CBC    Component Value Date/Time   WBC 8.3 10/06/2018 1008   RBC 5.23 10/06/2018 1008   HGB 15.1 10/06/2018 1008   HCT 46.0 10/06/2018 1008   PLT 254 10/06/2018 1008   MCV 88.0 10/06/2018 1008   MCH 28.9 10/06/2018 1008   MCHC 32.8 10/06/2018 1008   RDW 13.3 10/06/2018 1008   LYMPHSABS 1.3 10/06/2018 1008   MONOABS 0.9 10/06/2018 1008   EOSABS 0.3 10/06/2018 1008   BASOSABS 0.1 10/06/2018 1008    CMP     Component Value Date/Time   NA 137 10/06/2018 1008   K 4.4 10/06/2018 1008   CL 105 10/06/2018 1008   CO2 25 10/06/2018 1008   GLUCOSE 100 (H) 10/06/2018 1008   BUN 16 10/06/2018 1008   CREATININE 1.36 (H) 10/06/2018 1008   CALCIUM 9.2 10/06/2018 1008   PROT 7.6 10/06/2018 1008   ALBUMIN 4.2 10/06/2018 1008   AST 21 10/06/2018 1008    ALT 25 10/06/2018 1008   ALKPHOS 72 10/06/2018 1008   BILITOT 0.5 10/06/2018 1008   GFRNONAA 51 (L) 10/06/2018 1008   GFRAA 59 (L) 10/06/2018 1008     Assessment and Plan: 1. Squamous carcinoma of lung, right (HCC)   2. Elevated serum creatinine     Lung Cancer,  CT chest was independently reviewed and discussed with patient.  Post treatment changes of right lower lobe. Stable disease.  Recommend continue to follow up with CT chest every 3 months.   Elevated creatinine, this is a new problem for him.  I discussed with  him about potential nephrotoxicity from naproxen and advise patient to stop naproxen and switch to Tylenol Q6h as needed for his chronic joint pain.[name]  He voices understanding. Repeat labs in 3 months, if persistently elevated, will refer to Nephrology.   Follow Up Instructions: CT chest wo in 3 months.  Lab md in 3 months.   I discussed the assessment and treatment plan with the patient. The patient was provided an opportunity to ask questions and all were answered. The patient agreed with the plan and demonstrated an understanding of the instructions.  The patient was advised to call back or seek an in-person evaluation if the symptoms worsen or if the condition fails to improve as anticipated.   We started with face to face encounter using [employer]. Due to low Internet speed, we experienced technical difficulties and have to switch to telephone visit to finish the encounter.    Earlie Server, MD 10/09/2018 2:27 PM

## 2018-10-09 NOTE — Progress Notes (Signed)
Called patient for Telehealth visit via [place].  Patient c/o right rib soreness.

## 2018-10-23 ENCOUNTER — Inpatient Hospital Stay
Admission: EM | Admit: 2018-10-23 | Discharge: 2018-10-25 | DRG: 872 | Disposition: A | Discharge status: Home / Self Care | Payer: Medicare Other | Attending: Internal Medicine | Admitting: Internal Medicine

## 2018-10-23 ENCOUNTER — Encounter: Payer: Self-pay | Admitting: Emergency Medicine

## 2018-10-23 ENCOUNTER — Observation Stay: Payer: Medicare Other

## 2018-10-23 ENCOUNTER — Other Ambulatory Visit: Payer: Self-pay

## 2018-10-23 ENCOUNTER — Emergency Department: Payer: Medicare Other

## 2018-10-23 DIAGNOSIS — Z9221 Personal history of antineoplastic chemotherapy: Secondary | ICD-10-CM

## 2018-10-23 DIAGNOSIS — Z85118 Personal history of other malignant neoplasm of bronchus and lung: Secondary | ICD-10-CM

## 2018-10-23 DIAGNOSIS — N401 Enlarged prostate with lower urinary tract symptoms: Secondary | ICD-10-CM | POA: Diagnosis present

## 2018-10-23 DIAGNOSIS — R109 Unspecified abdominal pain: Secondary | ICD-10-CM

## 2018-10-23 DIAGNOSIS — I70213 Atherosclerosis of native arteries of extremities with intermittent claudication, bilateral legs: Secondary | ICD-10-CM | POA: Diagnosis present

## 2018-10-23 DIAGNOSIS — Z923 Personal history of irradiation: Secondary | ICD-10-CM

## 2018-10-23 DIAGNOSIS — N390 Urinary tract infection, site not specified: Secondary | ICD-10-CM | POA: Diagnosis present

## 2018-10-23 DIAGNOSIS — I10 Essential (primary) hypertension: Secondary | ICD-10-CM | POA: Diagnosis present

## 2018-10-23 DIAGNOSIS — Z20828 Contact with and (suspected) exposure to other viral communicable diseases: Secondary | ICD-10-CM | POA: Diagnosis present

## 2018-10-23 DIAGNOSIS — D72829 Elevated white blood cell count, unspecified: Secondary | ICD-10-CM

## 2018-10-23 DIAGNOSIS — A4151 Sepsis due to Escherichia coli [E. coli]: Principal | ICD-10-CM | POA: Diagnosis present

## 2018-10-23 DIAGNOSIS — Z886 Allergy status to analgesic agent status: Secondary | ICD-10-CM

## 2018-10-23 DIAGNOSIS — E785 Hyperlipidemia, unspecified: Secondary | ICD-10-CM | POA: Diagnosis present

## 2018-10-23 DIAGNOSIS — Z809 Family history of malignant neoplasm, unspecified: Secondary | ICD-10-CM

## 2018-10-23 DIAGNOSIS — R079 Chest pain, unspecified: Secondary | ICD-10-CM

## 2018-10-23 DIAGNOSIS — R338 Other retention of urine: Secondary | ICD-10-CM | POA: Diagnosis present

## 2018-10-23 DIAGNOSIS — Z7902 Long term (current) use of antithrombotics/antiplatelets: Secondary | ICD-10-CM

## 2018-10-23 DIAGNOSIS — Z87891 Personal history of nicotine dependence: Secondary | ICD-10-CM

## 2018-10-23 HISTORY — DX: Essential (primary) hypertension: I10

## 2018-10-23 HISTORY — DX: Hyperlipidemia, unspecified: E78.5

## 2018-10-23 LAB — BLOOD CULTURE ID PANEL (REFLEXED)

## 2018-10-23 LAB — URINALYSIS, COMPLETE (UACMP) WITH MICROSCOPIC
Bilirubin Urine: NEGATIVE
Glucose, UA: NEGATIVE mg/dL
Ketones, ur: NEGATIVE mg/dL
Nitrite: NEGATIVE
Protein, ur: NEGATIVE mg/dL
Specific Gravity, Urine: 1.026 (ref 1.005–1.030)
WBC, UA: >50 WBC/hpf — ABNORMAL HIGH (ref 0–5)
pH: 6 (ref 5.0–8.0)

## 2018-10-23 LAB — CBC WITH DIFFERENTIAL/PLATELET
Abs Immature Granulocytes: 0.14 10*3/uL — ABNORMAL HIGH (ref 0.00–0.07)
Basophils Absolute: 0.1 10*3/uL (ref 0.0–0.1)
Basophils Relative: 0 %
Eosinophils Absolute: 0 10*3/uL (ref 0.0–0.5)
Eosinophils Relative: 0 %
HCT: 44.8 % (ref 39.0–52.0)
Hemoglobin: 15.1 g/dL (ref 13.0–17.0)
Immature Granulocytes: 1 %
Lymphocytes Relative: 5 %
Lymphs Abs: 1 10*3/uL (ref 0.7–4.0)
MCH: 29.2 pg (ref 26.0–34.0)
MCHC: 33.7 g/dL (ref 30.0–36.0)
MCV: 86.7 fL (ref 80.0–100.0)
Monocytes Absolute: 2 10*3/uL — ABNORMAL HIGH (ref 0.1–1.0)
Monocytes Relative: 9 %
Neutro Abs: 18.5 10*3/uL — ABNORMAL HIGH (ref 1.7–7.7)
Neutrophils Relative %: 85 %
Platelets: 277 10*3/uL (ref 150–400)
RBC: 5.17 MIL/uL (ref 4.22–5.81)
RDW: 13.7 % (ref 11.5–15.5)
WBC: 21.7 10*3/uL — ABNORMAL HIGH (ref 4.0–10.5)
nRBC: 0 % (ref 0.0–0.2)

## 2018-10-23 LAB — BASIC METABOLIC PANEL
Anion gap: 13 (ref 5–15)
BUN: 21 mg/dL (ref 8–23)
CO2: 21 mmol/L — ABNORMAL LOW (ref 22–32)
Calcium: 9 mg/dL (ref 8.9–10.3)
Chloride: 100 mmol/L (ref 98–111)
Creatinine, Ser: 1.42 mg/dL — ABNORMAL HIGH (ref 0.61–1.24)
GFR calc Af Amer: 56 mL/min — ABNORMAL LOW (ref >60)
GFR calc non Af Amer: 48 mL/min — ABNORMAL LOW (ref >60)
Glucose, Bld: 187 mg/dL — ABNORMAL HIGH (ref 70–99)
Potassium: 3.5 mmol/L (ref 3.5–5.1)
Sodium: 134 mmol/L — ABNORMAL LOW (ref 135–145)

## 2018-10-23 LAB — CBC
HCT: 46 % (ref 39.0–52.0)
Hemoglobin: 15.3 g/dL (ref 13.0–17.0)
MCH: 29.1 pg (ref 26.0–34.0)
MCHC: 33.3 g/dL (ref 30.0–36.0)
MCV: 87.6 fL (ref 80.0–100.0)
Platelets: 271 10*3/uL (ref 150–400)
RBC: 5.25 MIL/uL (ref 4.22–5.81)
RDW: 13.7 % (ref 11.5–15.5)
WBC: 22.3 10*3/uL — ABNORMAL HIGH (ref 4.0–10.5)
nRBC: 0 % (ref 0.0–0.2)

## 2018-10-23 LAB — HEPATIC FUNCTION PANEL
ALT: 20 U/L (ref 0–44)
AST: 16 U/L (ref 15–41)
Albumin: 3.8 g/dL (ref 3.5–5.0)
Alkaline Phosphatase: 56 U/L (ref 38–126)
Bilirubin, Direct: 0.2 mg/dL (ref 0.0–0.2)
Indirect Bilirubin: 0.7 mg/dL (ref 0.3–0.9)
Total Bilirubin: 0.9 mg/dL (ref 0.3–1.2)
Total Protein: 7.4 g/dL (ref 6.5–8.1)

## 2018-10-23 LAB — LACTIC ACID, PLASMA: Lactic Acid, Venous: 1.2 mmol/L (ref 0.5–1.9)

## 2018-10-23 LAB — LIPASE, BLOOD: Lipase: 22 U/L (ref 11–51)

## 2018-10-23 LAB — SARS CORONAVIRUS 2 BY RT PCR (HOSPITAL ORDER, PERFORMED IN ~~LOC~~ HOSPITAL LAB): SARS Coronavirus 2: NEGATIVE

## 2018-10-23 LAB — TROPONIN I: Troponin I: <0.03 ng/mL (ref <0.03)

## 2018-10-23 MED ORDER — IOHEXOL 350 MG/ML SOLN
75.0000 mL | Freq: Once | INTRAVENOUS | Status: AC | PRN
  Administered 2018-10-23: 07:00:00 75 mL via INTRAVENOUS

## 2018-10-23 MED ORDER — SODIUM CHLORIDE 0.9 % IV SOLN
1.0000 g | INTRAVENOUS | Status: DC
  Filled 2018-10-23: qty 10

## 2018-10-23 MED ORDER — SODIUM CHLORIDE 0.9 % IV SOLN: 250.0000 mL | INTRAVENOUS | Status: DC | PRN

## 2018-10-23 MED ORDER — HYDROCHLOROTHIAZIDE 12.5 MG PO CAPS
12.5000 mg | ORAL_CAPSULE | Freq: Every day | ORAL | Status: DC
  Administered 2018-10-23 – 2018-10-25 (×3): 12.5 mg via ORAL
  Filled 2018-10-23 (×3): qty 1

## 2018-10-23 MED ORDER — TAMSULOSIN HCL 0.4 MG PO CAPS
0.4000 mg | ORAL_CAPSULE | Freq: Every day | ORAL | Status: DC
  Administered 2018-10-23 – 2018-10-25 (×3): 0.4 mg via ORAL
  Filled 2018-10-23 (×3): qty 1

## 2018-10-23 MED ORDER — CLOPIDOGREL BISULFATE 75 MG PO TABS
75.0000 mg | ORAL_TABLET | Freq: Every day | ORAL | Status: DC
  Administered 2018-10-23 – 2018-10-25 (×3): 75 mg via ORAL
  Filled 2018-10-23 (×3): qty 1

## 2018-10-23 MED ORDER — LOSARTAN POTASSIUM-HCTZ 100-12.5 MG PO TABS: 1.0000 | ORAL_TABLET | Freq: Every day | ORAL | Status: DC

## 2018-10-23 MED ORDER — ACETAMINOPHEN 650 MG RE SUPP: 650.0000 mg | Freq: Four times a day (QID) | RECTAL | Status: DC | PRN

## 2018-10-23 MED ORDER — IBUPROFEN 400 MG PO TABS: 400.0000 mg | ORAL_TABLET | Freq: Four times a day (QID) | ORAL | Status: DC | PRN

## 2018-10-23 MED ORDER — ACETAMINOPHEN 325 MG PO TABS: 650.0000 mg | ORAL_TABLET | Freq: Four times a day (QID) | ORAL | Status: DC | PRN

## 2018-10-23 MED ORDER — ENOXAPARIN SODIUM 40 MG/0.4ML ~~LOC~~ SOLN
40.0000 mg | SUBCUTANEOUS | Status: DC
  Administered 2018-10-23 – 2018-10-24 (×2): 40 mg via SUBCUTANEOUS
  Filled 2018-10-23 (×2): qty 0.4

## 2018-10-23 MED ORDER — SODIUM CHLORIDE 0.9 % IV SOLN
1.0000 g | Freq: Once | INTRAVENOUS | Status: AC
  Administered 2018-10-23: 09:00:00 1 g via INTRAVENOUS
  Filled 2018-10-23: qty 10

## 2018-10-23 MED ORDER — SODIUM CHLORIDE 0.9 % IV BOLUS
1000.0000 mL | Freq: Once | INTRAVENOUS | Status: AC
  Administered 2018-10-23: 06:00:00 1000 mL via INTRAVENOUS

## 2018-10-23 MED ORDER — ONDANSETRON HCL 4 MG PO TABS: 4.0000 mg | ORAL_TABLET | Freq: Four times a day (QID) | ORAL | Status: DC | PRN

## 2018-10-23 MED ORDER — ATORVASTATIN CALCIUM 10 MG PO TABS
10.0000 mg | ORAL_TABLET | Freq: Every day | ORAL | Status: DC
  Administered 2018-10-23 – 2018-10-25 (×3): 10 mg via ORAL
  Filled 2018-10-23 (×3): qty 1

## 2018-10-23 MED ORDER — SODIUM CHLORIDE 0.9 % IV SOLN
INTRAVENOUS | Status: DC
  Administered 2018-10-23 – 2018-10-25 (×3): via INTRAVENOUS

## 2018-10-23 MED ORDER — ONDANSETRON HCL 4 MG/2ML IJ SOLN: 4.0000 mg | Freq: Four times a day (QID) | INTRAMUSCULAR | Status: DC | PRN

## 2018-10-23 MED ORDER — SODIUM CHLORIDE 0.9% FLUSH: 3.0000 mL | Freq: Once | INTRAVENOUS | Status: DC

## 2018-10-23 MED ORDER — SODIUM CHLORIDE 0.9% FLUSH
3.0000 mL | Freq: Two times a day (BID) | INTRAVENOUS | Status: DC
  Administered 2018-10-24 – 2018-10-25 (×3): 3 mL via INTRAVENOUS

## 2018-10-23 MED ORDER — SODIUM CHLORIDE 0.9% FLUSH: 3.0000 mL | INTRAVENOUS | Status: DC | PRN

## 2018-10-23 MED ORDER — SODIUM CHLORIDE 0.9 % IV SOLN
1.0000 g | Freq: Two times a day (BID) | INTRAVENOUS | Status: DC
  Administered 2018-10-23 – 2018-10-25 (×4): 1 g via INTRAVENOUS
  Filled 2018-10-23 (×5): qty 1

## 2018-10-23 MED ORDER — LOSARTAN POTASSIUM 50 MG PO TABS
100.0000 mg | ORAL_TABLET | Freq: Every day | ORAL | Status: DC
  Administered 2018-10-23 – 2018-10-25 (×3): 100 mg via ORAL
  Filled 2018-10-23 (×3): qty 2

## 2018-10-23 NOTE — ED Provider Notes (Signed)
-----------------------------------------   8:28 AM on 10/23/2018 -----------------------------------------  I took over care on this patient from Dr. Beather Arbour.  Repeat CBC with differential shows increased neutrophils.  UA is consistent with UTI.  Given the leukocytosis and the persistent tachycardia, I will proceed with admission.  I signed the patient out to the hospitalist Dr. Darvin Neighbours.   Arta Silence, MD 10/23/18 [phone]

## 2018-10-23 NOTE — H&P (Signed)
[facility] [facility] [place] NAME: Peggy Monk    MR#:  [id number]  DATE OF BIRTH:  12-16-1945  DATE OF ADMISSION:  10/23/2018  PRIMARY CARE PHYSICIAN: Derinda Late, MD   REQUESTING/REFERRING PHYSICIAN: Dub Mikes, MD  CHIEF COMPLAINT:   Chief Complaint  Patient presents with  . Chest Pain  . Urinary Retention    HISTORY OF PRESENT ILLNESS: Riyad Keena  is a 73 y.o. 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  Patient states that it is sharp in nature and comes and goes.  He also presents with complaint of urinary frequency hesitancy and burning.  Patient was evaluated in the emergency room for these symptoms CT scan of the chest failed to show any pneumonia shows postradiation changes.  Patient also noticed to have severe leukocytosis and a UTI.  PAST MEDICAL HISTORY:   Past Medical History:  Diagnosis Date  . Cancer ([place])    skin  . HTN (hypertension)   . Hyperlipemia   . [place] ([place])   . Peripheral arterial disease ([place])     PAST SURGICAL HISTORY:  Past Surgical History:  Procedure Laterality Date  . BACK SURGERY    . BACK SURGERY     1971  . COLONOSCOPY    . LOWER EXTREMITY ANGIOGRAPHY Right 05/01/2018   Procedure: LOWER EXTREMITY ANGIOGRAPHY;  Surgeon: Algernon Huxley, MD;  Location: [place] [facility];  Service: Cardiovascular;  Laterality: Right;  . LOWER EXTREMITY ANGIOGRAPHY Left 05/08/2018   Procedure: LOWER EXTREMITY ANGIOGRAPHY;  Surgeon: Algernon Huxley, MD;  Location: [place] [facility];  Service: Cardiovascular;  Laterality: Left;    SOCIAL HISTORY:  Social History   Tobacco Use  . Smoking status: Former Research scientist (life sciences)  . Smokeless tobacco: Never Used  . Tobacco comment: Last cigarette on 06/04/17  Substance Use Topics  .  Alcohol use: No    Frequency: Never    FAMILY HISTORY:  Family History  Problem Relation Age of Onset  . Cancer Mother   . Cancer Father   . Cancer Sister     DRUG ALLERGIES: No Known Allergies  REVIEW OF SYSTEMS:   CONSTITUTIONAL: No fever, fatigue or weakness.  EYES: No blurred or double vision.  EARS, NOSE, AND THROAT: No tinnitus or ear pain.  RESPIRATORY: No cough, shortness of breath, wheezing or hemoptysis.  CARDIOVASCULAR: Positive chest pain, orthopnea, edema.  GASTROINTESTINAL: No nausea, vomiting, diarrhea or abdominal pain.  GENITOURINARY: Positive dysuria, no hematuria.  Urgency with urination ENDOCRINE: No polyuria, nocturia,  HEMATOLOGY: No anemia, easy bruising or bleeding SKIN: No rash or lesion. MUSCULOSKELETAL: No joint pain or arthritis.   NEUROLOGIC: No tingling, numbness, weakness.  PSYCHIATRY: No anxiety or depression.   MEDICATIONS AT HOME:  Prior to Admission medications   Medication Sig Start Date End Date Taking? Authorizing Provider  atorvastatin (LIPITOR) 10 MG tablet Take 1 tablet (10 mg total) by mouth daily. 05/01/18 05/01/19 Yes Dew, Erskine Squibb, MD  clopidogrel (PLAVIX) 75 MG tablet Take 1 tablet (75 mg total) by mouth daily. 05/01/18  Yes Dew, Erskine Squibb, MD  losartan-hydrochlorothiazide (HYZAAR) 100-12.5 MG tablet Take 1 tablet by mouth daily.   Yes [provider]      PHYSICAL EXAMINATION:   VITAL SIGNS: Blood pressure (!) 146/76, pulse 96, temperature 97.8  F (36.6 C), temperature source Oral, resp. rate 18, height 5\' 9"  (1.753 m), weight 72.6 kg, SpO2 97 %.  GENERAL:  73 y.o.-year-old patient lying in the bed with no acute distress.  EYES: Pupils equal, round, reactive to light and accommodation. No scleral icterus. Extraocular muscles intact.  HEENT: Head atraumatic, normocephalic. Oropharynx and nasopharynx clear.  NECK:  Supple, no jugular venous distention. No thyroid enlargement, no tenderness.  LUNGS: Normal breath sounds  bilaterally, no wheezing, rales,rhonchi or crepitation. No use of accessory muscles of respiration.  CARDIOVASCULAR: S1, S2 normal. No murmurs, rubs, or gallops.  ABDOMEN: Soft, mild tenderness in the right upper quadrant but no guarding, nondistended. Bowel sounds present. No organomegaly or mass.  EXTREMITIES: No pedal edema, cyanosis, or clubbing.  NEUROLOGIC: Cranial nerves II through XII are intact. Muscle strength 5/5 in all extremities. Sensation intact. Gait not checked.  PSYCHIATRIC: The patient is alert and oriented x 3.  SKIN: No obvious rash, lesion, or ulcer.   LABORATORY PANEL:   CBC Recent Labs  Lab 10/23/18 0529  WBC 21.7*  22.3*  HGB 15.1  15.3  HCT 44.8  46.0  PLT 277  271  MCV 86.7  87.6  MCH 29.2  29.1  MCHC 33.7  33.3  RDW 13.7  13.7  LYMPHSABS 1.0  MONOABS 2.0*  EOSABS 0.0  BASOSABS 0.1   ------------------------------------------------------------------------------------------------------------------  Chemistries  Recent Labs  Lab 10/23/18 0529  NA 134*  K 3.5  CL 100  CO2 21*  GLUCOSE 187*  BUN 21  CREATININE 1.42*  CALCIUM 9.0  AST 16  ALT 20  ALKPHOS 56  BILITOT 0.9   ------------------------------------------------------------------------------------------------------------------ estimated creatinine clearance is 45.6 mL/min (A) (by C-G formula based on SCr of 1.42 mg/dL (H)). ------------------------------------------------------------------------------------------------------------------ No results for input(s): TSH, T4TOTAL, T3FREE, THYROIDAB in the last 72 hours.  Invalid input(s): FREET3   Coagulation profile No results for input(s): INR, PROTIME in the last 168 hours. ------------------------------------------------------------------------------------------------------------------- No results for input(s): DDIMER in the last 72  hours. -------------------------------------------------------------------------------------------------------------------  Cardiac Enzymes Recent Labs  Lab 10/23/18 0529  TROPONINI <0.03   ------------------------------------------------------------------------------------------------------------------ Invalid input(s): POCBNP  ---------------------------------------------------------------------------------------------------------------  Urinalysis    Component Value Date/Time   COLORURINE YELLOW (A) 10/23/2018 0556   APPEARANCEUR CLOUDY (A) 10/23/2018 0556   LABSPEC 1.026 10/23/2018 0556   PHURINE 6.0 10/23/2018 0556   GLUCOSEU NEGATIVE 10/23/2018 0556   HGBUR MODERATE (A) 10/23/2018 0556   BILIRUBINUR NEGATIVE 10/23/2018 0556   KETONESUR NEGATIVE 10/23/2018 0556   PROTEINUR NEGATIVE 10/23/2018 0556   NITRITE NEGATIVE 10/23/2018 0556   LEUKOCYTESUR LARGE (A) 10/23/2018 0556     RADIOLOGY: Ct Angio Chest Pe W/cm &/or Wo Cm  Result Date: 10/23/2018 CLINICAL DATA:  73 year old male with right lower rib and right upper quadrant abdominal pain. EXAM: CT ANGIOGRAPHY CHEST WITH CONTRAST TECHNIQUE: Multidetector CT imaging of the chest was performed using the standard protocol during bolus administration of intravenous contrast. Multiplanar CT image reconstructions and MIPs were obtained to evaluate the vascular anatomy. CONTRAST:  23mL OMNIPAQUE IOHEXOL 350 MG/ML SOLN COMPARISON:  Chest CT 10/05/2018. FINDINGS: Cardiovascular: No filling defects in the pulmonary arterial tree to suggest underlying pulmonary embolism. Heart size is normal. There is no significant pericardial fluid, thickening or pericardial calcification. There is aortic atherosclerosis, as well as atherosclerosis of the great vessels of the mediastinum and the coronary arteries, including calcified atherosclerotic plaque in the left anterior descending and left circumflex coronary arteries. Mediastinum/Nodes: No  pathologically enlarged mediastinal or hilar lymph nodes.  Multiple densely calcified right hilar and mediastinal lymph nodes are incidentally noted. Esophagus is unremarkable in appearance. No axillary lymphadenopathy. Lungs/Pleura: Mass-like area of architectural distortion in the right lower lobe, similar to prior examinations, compatible with postradiation mass-like fibrosis. No definite suspicious appearing pulmonary nodules or masses are noted on today's examination. Several small calcified granulomas are noted. No acute consolidative airspace disease. No pleural effusions. Mild diffuse bronchial wall thickening with mild centrilobular and paraseptal emphysema. Upper Abdomen: 1.3 cm low-attenuation lesion in the central aspect of segment 8 of the liver, and smaller subcentimeter low-attenuation lesion in segment 2 of the liver, stable compared to the prior study, both likely small cysts. Exophytic simple cyst in the upper pole of the right kidney measuring 1.7 cm in diameter. Aortic atherosclerosis. Musculoskeletal: There are no aggressive appearing lytic or blastic lesions noted in the visualized portions of the skeleton. Review of the MIP images confirms the above findings. IMPRESSION: 1. No evidence of pulmonary embolism. 2. No acute findings in the thorax to account for the patient's symptoms. 3. Post treatment related changes of mass-like fibrosis in the right lower lobe, similar to prior examinations. No findings to suggest local recurrence of disease or metastatic disease in the thorax. 4. Aortic atherosclerosis, in addition to 2 vessel coronary artery disease. Assessment for potential risk factor modification, dietary therapy or pharmacologic therapy may be warranted, if clinically indicated. 5. Mild diffuse bronchial wall thickening with mild centrilobular and paraseptal emphysema; imaging findings suggestive of underlying COPD. Aortic Atherosclerosis (ICD10-I70.0) and Emphysema (ICD10-J43.9).  Electronically Signed   By: Vinnie Langton M.D.   On: 10/23/2018 06:55   Dg Chest Port 1 View  Result Date: 10/23/2018 CLINICAL DATA:  73 year old male with history of right lower rib and right upper quadrant abdominal pain. EXAM: PORTABLE CHEST 1 VIEW COMPARISON:  Chest x-ray 05/22/2017.  Chest CT 10/05/2018. FINDINGS: Ill-defined opacity in the right lower lobe corresponding to area of radiation fibrosis seen on recent chest CT 10/05/2018. Left lung is clear. No pleural effusions. No evidence of pulmonary edema. Heart size is normal. Upper mediastinal contours are within normal limits. Aortic atherosclerosis. IMPRESSION: 1. No radiographic evidence of acute cardiopulmonary disease. 2. 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 Electronically Signed   By: Vinnie Langton M.D.   On: 10/23/2018 06:06    EKG: Orders placed or performed during the hospital encounter of 10/23/18  . ED EKG  . ED EKG    IMPRESSION AND PLAN: Patient 73 year old white male with stage IIb small cell cancer of the lung presenting with right-sided chest pain and UTI  1.  Urinary tract infect we will treat with IV ceftriaxone Follow urine cultures There is some urinary retention symptoms we will start Flomax We will check a bladder scan  2.  Right lower chest/right upper quadrant abdominal pain etiology unclear CT negative for PE I will go ahead and check a right upper quadrant ultrasound to make sure this is not gallbladder related  3.  Essential hypertension continue therapy with losartan HCTZ  4.  Hyperlipidemia continue Lipitor  5.  Peripheral vascular disease continue Plavix  6.  Miscellaneous Lovenox for DVT prophylaxis     All the records are reviewed and case discussed with ED provider. Management plans discussed with the patient, family and they are in agreement.  CODE STATUS: Code Status History    Date Active Date Inactive Code Status Order ID  Comments User Context   05/08/2018 1014  05/08/2018 1505 Full Code [id number]  Algernon Huxley, MD Inpatient   05/01/2018 1012 05/01/2018 1458 Full Code [id number]  Algernon Huxley, MD Inpatient    Advance Directive Documentation     Most Recent Value  Type of Advance Directive  Healthcare Power of Attorney  Pre-existing out of facility DNR order (yellow form or pink MOST form)  -  "MOST" Form in Place?  -       TOTAL TIME TAKING CARE OF THIS PATIENT:60minutes.    Dustin Flock M.D on 10/23/2018 at 8:46 AM  Between 7am to 6pm - Pager - [phone]  After 6pm go to [email or web address] - password [email or web address] [facility]  [phone]  CC: Primary care physician; Derinda Late, MD

## 2018-10-23 NOTE — ED Notes (Signed)
This RN answered pt call bell. Pt states that he has to urinate. Given urinal and assisted to sit on side of bed. Pt states that he is unsure if he can produce urine but does have urge. Will continue to monitor.

## 2018-10-23 NOTE — ED Triage Notes (Addendum)
Pt ambulatory with steady gait to stat registration; c/o right lower rib/upper quadrant abd pain; says it feels like 10 bees stinging him; pain for 1-2 weeks; denies fall/injury;  pt also reports difficulty voiding for the last 3-4 days; says it feels like "razor blades" when he voids; denies frank blood; talking in complete coherent sentences;

## 2018-10-23 NOTE — ED Notes (Signed)
ED TO INPATIENT HANDOFF REPORT  ED Nurse Name and Phone #: Jeanluc Wegman [phone]  S Name/Age/Gender Marvin Williams 73 y.o. male Room/Bed: ED10A/ED10A  Code Status   Code Status: Prior  Home/SNF/Other Home Patient oriented to: self, place, time and situation Is this baseline? Yes   Triage Complete: Triage complete  Chief Complaint Right Rib, unable to urinate  Triage Note Pt ambulatory with steady gait to stat registration; c/o right lower rib/upper quadrant abd pain; says it feels like 10 bees stinging him; pain for 1-2 weeks; denies fall/injury;  pt also reports difficulty voiding for the last 3-4 days; says it feels like "razor blades" when he voids; denies frank blood; talking in complete coherent sentences;    Allergies Allergies  Allergen Reactions  . Naproxen Other (See Comments)    Per patient: oncologist prefers he does NOT take     Level of Care/Admitting Diagnosis ED Disposition    ED Disposition Condition [place]: [place] [[id number]]  Level of Care: Med-Surg [16]  Covid Evaluation: N/A  Diagnosis: UTI (urinary tract infection) [932355]  Admitting Physician: Dustin Flock [[id number]]  Attending Physician: Dustin Flock [[id number]]  PT Class (Do Not Modify): Observation [104]  PT Acc Code (Do Not Modify): Observation [10022]       B Medical/Surgery History Past Medical History:  Diagnosis Date  . Cancer ([place])    skin  . HTN (hypertension)   . Hyperlipemia   . Lung cancer ([place])   . Peripheral arterial disease [facility])    Past Surgical History:  Procedure Laterality Date  . BACK SURGERY    . BACK SURGERY     1971  . COLONOSCOPY    . LOWER EXTREMITY ANGIOGRAPHY Right 05/01/2018   Procedure: LOWER EXTREMITY ANGIOGRAPHY;  Surgeon: Algernon Huxley, MD;  Location: [place] [facility];  Service: Cardiovascular;  Laterality: Right;  . LOWER EXTREMITY ANGIOGRAPHY Left 05/08/2018   Procedure: LOWER EXTREMITY ANGIOGRAPHY;   Surgeon: Algernon Huxley, MD;  Location: [place] [facility];  Service: Cardiovascular;  Laterality: Left;     A IV Location/Drains/Wounds Patient Lines/Drains/Airways Status   Active Line/Drains/Airways    Name:   Placement date:   Placement time:   Site:   Days:   Peripheral IV 10/23/18 Right Forearm   10/23/18    0530    Forearm   less than 1          Intake/Output Last 24 hours  Intake/Output Summary (Last 24 hours) at 10/23/2018 0909 Last data filed at 10/23/2018 2376 Gross per 24 hour  Intake 1000 ml  Output -  Net 1000 ml    Labs/Imaging Results for orders placed or performed during the hospital encounter of 10/23/18 (from the past 48 hour(s))  Basic metabolic panel     Status: Abnormal   Collection Time: 10/23/18  5:29 AM  Result Value Ref Range   Sodium 134 (L) 135 - 145 mmol/L   Potassium 3.5 3.5 - 5.1 mmol/L   Chloride 100 98 - 111 mmol/L   CO2 21 (L) 22 - 32 mmol/L   Glucose, Bld 187 (H) 70 - 99 mg/dL   BUN 21 8 - 23 mg/dL   Creatinine, Ser 1.42 (H) 0.61 - 1.24 mg/dL   Calcium 9.0 8.9 - 10.3 mg/dL   GFR calc non Af Amer 48 (L) >60 mL/min   GFR calc Af Amer 56 (L) >60 mL/min   Anion gap 13 5 - 15  Comment: Performed at [facility], [address] [place], [place] [address]  CBC     Status: Abnormal   Collection Time: 10/23/18  5:29 AM  Result Value Ref Range   WBC 22.3 (H) 4.0 - 10.5 K/uL   RBC 5.25 4.22 - 5.81 MIL/uL   Hemoglobin 15.3 13.0 - 17.0 g/dL   HCT 46.0 39.0 - 52.0 %   MCV 87.6 80.0 - 100.0 fL   MCH 29.1 26.0 - 34.0 pg   MCHC 33.3 30.0 - 36.0 g/dL   RDW 13.7 11.5 - 15.5 %   Platelets 271 150 - 400 K/uL   nRBC 0.0 0.0 - 0.2 %    Comment: Performed at [facility], [address] [place], [place] [address]  Troponin I - ONCE - STAT     Status: None   Collection Time: 10/23/18  5:29 AM  Result Value Ref Range   Troponin I <0.03 <0.03 ng/mL    Comment: Performed at [facility], [address]  [place], [place] [address]  Hepatic function panel     Status: None   Collection Time: 10/23/18  5:29 AM  Result Value Ref Range   Total Protein 7.4 6.5 - 8.1 g/dL   Albumin 3.8 3.5 - 5.0 g/dL   AST 16 15 - 41 U/L   ALT 20 0 - 44 U/L   Alkaline Phosphatase 56 38 - 126 U/L   Total Bilirubin 0.9 0.3 - 1.2 mg/dL   Bilirubin, Direct 0.2 0.0 - 0.2 mg/dL   Indirect Bilirubin 0.7 0.3 - 0.9 mg/dL    Comment: Performed at [facility], [address] [place], [place] [address]  Lipase, blood     Status: None   Collection Time: 10/23/18  5:29 AM  Result Value Ref Range   Lipase 22 11 - 51 U/L    Comment: Performed at [facility], [address] [place], [place] [address]  CBC with Differential/Platelet     Status: Abnormal   Collection Time: 10/23/18  5:29 AM  Result Value Ref Range   WBC 21.7 (H) 4.0 - 10.5 K/uL   RBC 5.17 4.22 - 5.81 MIL/uL   Hemoglobin 15.1 13.0 - 17.0 g/dL   HCT 44.8 39.0 - 52.0 %   MCV 86.7 80.0 - 100.0 fL   MCH 29.2 26.0 - 34.0 pg   MCHC 33.7 30.0 - 36.0 g/dL   RDW 13.7 11.5 - 15.5 %   Platelets 277 150 - 400 K/uL   nRBC 0.0 0.0 - 0.2 %   Neutrophils Relative % 85 %   Neutro Abs 18.5 (H) 1.7 - 7.7 K/uL   Lymphocytes Relative 5 %   Lymphs Abs 1.0 0.7 - 4.0 K/uL   Monocytes Relative 9 %   Monocytes Absolute 2.0 (H) 0.1 - 1.0 K/uL   Eosinophils Relative 0 %   Eosinophils Absolute 0.0 0.0 - 0.5 K/uL   Basophils Relative 0 %   Basophils Absolute 0.1 0.0 - 0.1 K/uL   Immature Granulocytes 1 %   Abs Immature Granulocytes 0.14 (H) 0.00 - 0.07 K/uL    Comment: Performed at [facility], [address] [place], [place] [address]  SARS Coronavirus 2 (CEPHEID - Performed in [place] hospital lab), Hosp Order     Status: None   Collection Time: 10/23/18  5:39 AM  Result Value Ref Range   SARS Coronavirus 2 NEGATIVE NEGATIVE    Comment: (NOTE) If result is NEGATIVE SARS-CoV-2  target nucleic acids are NOT DETECTED. The SARS-CoV-2 RNA is  generally detectable in upper and lower  respiratory specimens during the acute phase of infection. The lowest  concentration of SARS-CoV-2 viral copies this assay can detect is 250  copies / mL. A negative result does not preclude SARS-CoV-2 infection  and should not be used as the sole basis for treatment or other  patient management decisions.  A negative result may occur with  improper specimen collection / handling, submission of specimen other  than nasopharyngeal swab, presence of viral mutation(s) within the  areas targeted by this assay, and inadequate number of viral copies  (<250 copies / mL). A negative result must be combined with clinical  observations, patient history, and epidemiological information. If result is POSITIVE SARS-CoV-2 target nucleic acids are DETECTED. The SARS-CoV-2 RNA is generally detectable in upper and lower  respiratory specimens dur ing the acute phase of infection.  Positive  results are indicative of active infection with SARS-CoV-2.  Clinical  correlation with patient history and other diagnostic information is  necessary to determine patient infection status.  Positive results do  not rule out bacterial infection or co-infection with other viruses. If result is PRESUMPTIVE POSTIVE SARS-CoV-2 nucleic acids MAY BE PRESENT.   A presumptive positive result was obtained on the submitted specimen  and confirmed on repeat testing.  While 2019 novel coronavirus  (SARS-CoV-2) nucleic acids may be present in the submitted sample  additional confirmatory testing may be necessary for epidemiological  and / or clinical management purposes  to differentiate between  SARS-CoV-2 and other Sarbecovirus currently known to infect humans.  If clinically indicated additional testing with an alternate test  methodology [phone]) is advised. The SARS-CoV-2 RNA is generally  detectable in upper and lower respiratory sp ecimens during the acute  phase of  infection. The expected result is Negative. Fact Sheet for Patients:  StrictlyIdeas.no Fact Sheet for Healthcare Providers: BankingDealers.co.za This test is not yet approved or cleared by the [place] FDA and has been authorized for detection and/or diagnosis of SARS-CoV-2 by FDA under an Emergency Use Authorization (EUA).  This EUA will remain in effect (meaning this test can be used) for the duration of the COVID-19 declaration under Section 564(b)(1) of the Act, 21 U.S.C. section 360bbb-3(b)(1), unless the authorization is terminated or revoked sooner. Performed at [facility], [address] [place], [place] [address]   Urinalysis, Complete w Microscopic     Status: Abnormal   Collection Time: 10/23/18  5:56 AM  Result Value Ref Range   Color, Urine YELLOW (A) YELLOW   APPearance CLOUDY (A) CLEAR   Specific Gravity, Urine 1.026 1.005 - 1.030   pH 6.0 5.0 - 8.0   Glucose, UA NEGATIVE NEGATIVE mg/dL   Hgb urine dipstick MODERATE (A) NEGATIVE   Bilirubin Urine NEGATIVE NEGATIVE   Ketones, ur NEGATIVE NEGATIVE mg/dL   Protein, ur NEGATIVE NEGATIVE mg/dL   Nitrite NEGATIVE NEGATIVE   Leukocytes,Ua LARGE (A) NEGATIVE   RBC / HPF 6-10 0 - 5 RBC/hpf   WBC, UA >50 (H) 0 - 5 WBC/hpf   Bacteria, UA MANY (A) NONE SEEN   Squamous Epithelial / LPF 0-5 0 - 5    Comment: Performed at [facility], [address] [place], [place] [address]  Lactic acid, plasma     Status: None   Collection Time: 10/23/18  5:56 AM  Result Value Ref Range   Lactic Acid, Venous 1.2 0.5 - 1.9  mmol/L    Comment: Performed at [facility], [place] [address], [place] [address]   Ct Angio Chest Pe W/cm &/or Wo Cm  Result Date: 10/23/2018 CLINICAL DATA:  73 year old male with right lower rib and right upper quadrant abdominal pain. EXAM: CT ANGIOGRAPHY CHEST WITH CONTRAST TECHNIQUE: Multidetector CT imaging of the chest was  performed using the standard protocol during bolus administration of intravenous contrast. Multiplanar CT image reconstructions and MIPs were obtained to evaluate the vascular anatomy. CONTRAST:  66mL OMNIPAQUE IOHEXOL 350 MG/ML SOLN COMPARISON:  Chest CT 10/05/2018. FINDINGS: Cardiovascular: No filling defects in the pulmonary arterial tree to suggest underlying pulmonary embolism. Heart size is normal. There is no significant pericardial fluid, thickening or pericardial calcification. There is aortic atherosclerosis, as well as atherosclerosis of the great vessels of the mediastinum and the coronary arteries, including calcified atherosclerotic plaque in the left anterior descending and left circumflex coronary arteries. Mediastinum/Nodes: No pathologically enlarged mediastinal or hilar lymph nodes. Multiple densely calcified right hilar and mediastinal lymph nodes are incidentally noted. Esophagus is unremarkable in appearance. No axillary lymphadenopathy. Lungs/Pleura: Mass-like area of architectural distortion in the right lower lobe, similar to prior examinations, compatible with postradiation mass-like fibrosis. No definite suspicious appearing pulmonary nodules or masses are noted on today's examination. Several small calcified granulomas are noted. No acute consolidative airspace disease. No pleural effusions. Mild diffuse bronchial wall thickening with mild centrilobular and paraseptal emphysema. Upper Abdomen: 1.3 cm low-attenuation lesion in the central aspect of segment 8 of the liver, and smaller subcentimeter low-attenuation lesion in segment 2 of the liver, stable compared to the prior study, both likely small cysts. Exophytic simple cyst in the upper pole of the right kidney measuring 1.7 cm in diameter. Aortic atherosclerosis. Musculoskeletal: There are no aggressive appearing lytic or blastic lesions noted in the visualized portions of the skeleton. Review of the MIP images confirms the above  findings. IMPRESSION: 1. No evidence of pulmonary embolism. 2. No acute findings in the thorax to account for the patient's symptoms. 3. Post treatment related changes of mass-like fibrosis in the right lower lobe, similar to prior examinations. No findings to suggest local recurrence of disease or metastatic disease in the thorax. 4. Aortic atherosclerosis, in addition to 2 vessel coronary artery disease. Assessment for potential risk factor modification, dietary therapy or pharmacologic therapy may be warranted, if clinically indicated. 5. Mild diffuse bronchial wall thickening with mild centrilobular and paraseptal emphysema; imaging findings suggestive of underlying COPD. Aortic Atherosclerosis (ICD10-I70.0) and Emphysema (ICD10-J43.9). Electronically Signed   By: Vinnie Langton M.D.   On: 10/23/2018 06:55   Dg Chest Port 1 View  Result Date: 10/23/2018 CLINICAL DATA:  73 year old male with history of right lower rib and right upper quadrant abdominal pain. EXAM: PORTABLE CHEST 1 VIEW COMPARISON:  Chest x-ray 05/22/2017.  Chest CT 10/05/2018. FINDINGS: Ill-defined opacity in the right lower lobe corresponding to area of radiation fibrosis seen on recent chest CT 10/05/2018. Left lung is clear. No pleural effusions. No evidence of pulmonary edema. Heart size is normal. Upper mediastinal contours are within normal limits. Aortic atherosclerosis. IMPRESSION: 1. No radiographic evidence of acute cardiopulmonary disease. 2. 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 Electronically Signed   By: Vinnie Langton M.D.   On: 10/23/2018 06:06    Pending Labs Unresulted Labs (From admission, onward)    Start     Ordered   10/23/18 0545  Culture, blood (routine x 2)  BLOOD CULTURE X 2,   STAT     10/23/18 0544   10/23/18 0529  CBC with Differential/Platelet  Once,   R     10/23/18 0529   Signed and Held  CBC  (enoxaparin (LOVENOX)    CrCl >/= 30 ml/min)  Once,   R     Comments:  Baseline for enoxaparin therapy IF NOT ALREADY DRAWN.  Notify MD if PLT < 100 K.    Signed and Held   Signed and Held  Creatinine, serum  (enoxaparin (LOVENOX)    CrCl >/= 30 ml/min)  Once,   R    Comments:  Baseline for enoxaparin therapy IF NOT ALREADY DRAWN.    Signed and Held   Signed and Held  Creatinine, serum  (enoxaparin (LOVENOX)    CrCl >/= 30 ml/min)  Weekly,   R    Comments:  while on enoxaparin therapy    Signed and Held          Vitals/Pain Today's Vitals   10/23/18 0523 10/23/18 0530 10/23/18 0600 10/23/18 0902  BP:  (!) 141/85 (!) 146/76   Pulse:  (!) 109 96   Resp:  16 18   Temp:      TempSrc:      SpO2:  97% 97%   Weight:      Height:      PainSc: 3    6     Isolation Precautions No active isolations  Medications Medications  sodium chloride flush (NS) 0.9 % injection 3 mL (has no administration in time range)  cefTRIAXone (ROCEPHIN) 1 g in sodium chloride 0.9 % 100 mL IVPB (1 g Intravenous New Bag/Given 10/23/18 0903)  tamsulosin (FLOMAX) capsule 0.4 mg (0.4 mg Oral Given 10/23/18 0902)  sodium chloride 0.9 % bolus 1,000 mL (0 mLs Intravenous Stopped 10/23/18 0903)  iohexol (OMNIPAQUE) 350 MG/ML injection 75 mL (75 mLs Intravenous Contrast Given 10/23/18 1561)    Mobility walks Low fall risk   Focused Assessments    R Recommendations: See Admitting Provider Note  Report given to:   Additional Notes:

## 2018-10-23 NOTE — Consult Note (Signed)
Pharmacy Antibiotic Note  Marvin Williams. is a 73 y.o. male admitted on 10/23/2018 with UTI. BCID now showing E. Coli.  Pharmacy has been consulted for meropenem dosing.  Plan: Start meropenem 1g IV every 12 hours for current CrCl < 14ml/min.   Height: 5\' 9"  (175.3 cm) Weight: 160 lb (72.6 kg) IBW/kg (Calculated) : 70.7  Temp (24hrs), Avg:98.2 F (36.8 C), Min:97.8 F (36.6 C), Max:98.8 F (37.1 C)  Recent Labs  Lab 10/23/18 0529 10/23/18 0556  WBC 21.7*  22.3*  --   CREATININE 1.42*  --   LATICACIDVEN  --  1.2    Estimated Creatinine Clearance: 45.6 mL/min (A) (by C-G formula based on SCr of 1.42 mg/dL (H)).    Allergies  Allergen Reactions  . Naproxen Other (See Comments)    Per patient: oncologist prefers he does NOT take     Antimicrobials this admission: 5/25 ceftriaxone >> x 1 dose 5/25 Meropenem  >>   Microbiology results: 5/28  BCx: GNR 5/25 BCID: E. Coli  5/25 SARS Coronavirus 2: negative   Thank you for allowing pharmacy to be a part of this patient's care.  Pernell Dupre, PharmD, BCPS Clinical Pharmacist 10/23/2018 6:35 PM

## 2018-10-23 NOTE — ED Provider Notes (Signed)
[facility] Emergency Department Provider Note   ____________________________________________   First MD Initiated Contact with Patient 10/23/18 [phone]     (approximate)  I have reviewed the triage vital signs and the nursing notes.   HISTORY  Chief Complaint Chest Pain and Urinary Retention    HPI Marvin Williams. is a 73 y.o. male who presents to the ED from home with a chief complaint of chest pain and coughing.  Patient has a history of stage IIb squamous cell carcinoma of the lung status post radiation.  Also with a history of PAD, taking Plavix and followed by vascular surgery.  Complains of a 2-week history of sharp right lower chest pain and progressive cough.  Describes chest pain as 10 bees stinging him.  Pain is not affected by deep inspiration or position.  Denies fever, abdominal pain, nausea, vomiting, diarrhea.  Also reports dysuria and difficulty voiding.  History of BPH.  Denies recent travel, trauma or exposure to persons diagnosed with coronavirus.       Past Medical History:  Diagnosis Date  . Cancer ([place])    skin  . Lung cancer ([place])   . Peripheral arterial disease [facility])     Patient Active Problem List   Diagnosis Date Noted  . Essential hypertension 06/13/2018  . PAD (peripheral artery disease) ([place]) 10/12/2017  . Benign prostatic hyperplasia 06/20/2017  . Squamous carcinoma of lung, right ([place]) 06/16/2017  . Atherosclerosis of native arteries of extremity with intermittent claudication ([place]) 08/13/2016  . Hx of adenomatous colonic polyps 06/04/2014  . Stenosing tenosynovitis of finger 03/15/2014  . Rotator cuff tendinitis 11/12/2013    Past Surgical History:  Procedure Laterality Date  . BACK SURGERY    . BACK SURGERY     1971  . COLONOSCOPY    . LOWER EXTREMITY ANGIOGRAPHY Right 05/01/2018   Procedure: LOWER EXTREMITY ANGIOGRAPHY;  Surgeon: Algernon Huxley, MD;  Location: [place] [facility];  Service: Cardiovascular;   Laterality: Right;  . LOWER EXTREMITY ANGIOGRAPHY Left 05/08/2018   Procedure: LOWER EXTREMITY ANGIOGRAPHY;  Surgeon: Algernon Huxley, MD;  Location: [place] [facility];  Service: Cardiovascular;  Laterality: Left;    Prior to Admission medications   Medication Sig Start Date End Date Taking? Authorizing Provider  atorvastatin (LIPITOR) 10 MG tablet Take 1 tablet (10 mg total) by mouth daily. 05/01/18 05/01/19 Yes Dew, Erskine Squibb, MD  clopidogrel (PLAVIX) 75 MG tablet Take 1 tablet (75 mg total) by mouth daily. 05/01/18  Yes Dew, Erskine Squibb, MD  losartan-hydrochlorothiazide (HYZAAR) 100-12.5 MG tablet Take 1 tablet by mouth daily.   Yes [provider]    Allergies Patient has no known allergies.  Family History  Problem Relation Age of Onset  . Cancer Mother   . Cancer Father   . Cancer Sister     Social History Social History   Tobacco Use  . Smoking status: Former Research scientist (life sciences)  . Smokeless tobacco: Never Used  . Tobacco comment: Last cigarette on 06/04/17  Substance Use Topics  . Alcohol use: No    Frequency: Never  . Drug use: No    Review of Systems  Constitutional: No fever/chills Eyes: No visual changes. ENT: No sore throat. Cardiovascular: Positive for chest pain. Respiratory: Positive for nonproductive cough.  Denies shortness of breath. Gastrointestinal: No abdominal pain.  No nausea, no vomiting.  No diarrhea.  No constipation. Genitourinary: Positive for dysuria and difficulty urinating. Musculoskeletal: Negative for back pain. Skin: Negative  for rash. Neurological: Negative for headaches, focal weakness or numbness.   ____________________________________________   PHYSICAL EXAM:  VITAL SIGNS: ED Triage Vitals  Enc Vitals Group     BP 10/23/18 0520 (!) 146/82     Pulse Rate 10/23/18 0520 (!) 111     Resp 10/23/18 0520 18     Temp 10/23/18 0520 97.8 F (36.6 C)     Temp Source 10/23/18 0520 Oral     SpO2 10/23/18 0520 98 %     Weight 10/23/18 0516 160  lb (72.6 kg)     Height 10/23/18 0516 5\' 9"  (1.753 m)     Head Circumference --      Peak Flow --      Pain Score 10/23/18 0523 3     Pain Loc --      Pain Edu? --      Excl. in [place]? --     Constitutional: Alert and oriented. Well appearing and in mild acute distress. Eyes: Conjunctivae are normal. PERRL. EOMI. Head: Atraumatic. Nose: No congestion/rhinnorhea. Mouth/Throat: Mucous membranes are moist.  Oropharynx non-erythematous. Neck: No stridor.   Cardiovascular: Tachycardic rate, regular rhythm. Grossly normal heart sounds.  Good peripheral circulation. Respiratory: Normal respiratory effort.  No retractions. Lungs CTAB.  Dry cough noted.  Anterior chest not tender to palpation. Gastrointestinal: Soft and nontender. No distention. No abdominal bruits. No CVA tenderness. Musculoskeletal: No lower extremity tenderness nor edema.  No joint effusions. Neurologic:  Normal speech and language. No gross focal neurologic deficits are appreciated. No gait instability. Skin:  Skin is warm, dry and intact. No rash noted. Psychiatric: Mood and affect are normal. Speech and behavior are normal.  ____________________________________________   LABS (all labs ordered are listed, but only abnormal results are displayed)  Labs Reviewed  BASIC METABOLIC PANEL - Abnormal; Notable for the following components:      Result Value   Sodium 134 (*)    CO2 21 (*)    Glucose, Bld 187 (*)    Creatinine, Ser 1.42 (*)    GFR calc non Af Amer 48 (*)    GFR calc Af Amer 56 (*)    All other components within normal limits  CBC - Abnormal; Notable for the following components:   WBC 22.3 (*)    All other components within normal limits  SARS CORONAVIRUS 2 (HOSPITAL ORDER, [facility])  CULTURE, BLOOD (ROUTINE X 2)  CULTURE, BLOOD (ROUTINE X 2)  TROPONIN I  HEPATIC FUNCTION PANEL  LIPASE, BLOOD  LACTIC ACID, PLASMA  URINALYSIS, COMPLETE (UACMP) WITH MICROSCOPIC  CBC WITH  DIFFERENTIAL/PLATELET  CBC WITH DIFFERENTIAL/PLATELET   ____________________________________________  EKG  EKG ____________________________________________  RADIOLOGY  ED MD interpretation:  Pending  Official radiology report(s): Ct Angio Chest Pe W/cm &/or Wo Cm  Result Date: 10/23/2018 CLINICAL DATA:  73 year old male with right lower rib and right upper quadrant abdominal pain. EXAM: CT ANGIOGRAPHY CHEST WITH CONTRAST TECHNIQUE: Multidetector CT imaging of the chest was performed using the standard protocol during bolus administration of intravenous contrast. Multiplanar CT image reconstructions and MIPs were obtained to evaluate the vascular anatomy. CONTRAST:  33mL OMNIPAQUE IOHEXOL 350 MG/ML SOLN COMPARISON:  Chest CT 10/05/2018. FINDINGS: Cardiovascular: No filling defects in the pulmonary arterial tree to suggest underlying pulmonary embolism. Heart size is normal. There is no significant pericardial fluid, thickening or pericardial calcification. There is aortic atherosclerosis, as well as atherosclerosis of the great vessels of the mediastinum and the coronary arteries,  including calcified atherosclerotic plaque in the left anterior descending and left circumflex coronary arteries. Mediastinum/Nodes: No pathologically enlarged mediastinal or hilar lymph nodes. Multiple densely calcified right hilar and mediastinal lymph nodes are incidentally noted. Esophagus is unremarkable in appearance. No axillary lymphadenopathy. Lungs/Pleura: Mass-like area of architectural distortion in the right lower lobe, similar to prior examinations, compatible with postradiation mass-like fibrosis. No definite suspicious appearing pulmonary nodules or masses are noted on today's examination. Several small calcified granulomas are noted. No acute consolidative airspace disease. No pleural effusions. Mild diffuse bronchial wall thickening with mild centrilobular and paraseptal emphysema. Upper Abdomen: 1.3 cm  low-attenuation lesion in the central aspect of segment 8 of the liver, and smaller subcentimeter low-attenuation lesion in segment 2 of the liver, stable compared to the prior study, both likely small cysts. Exophytic simple cyst in the upper pole of the right kidney measuring 1.7 cm in diameter. Aortic atherosclerosis. Musculoskeletal: There are no aggressive appearing lytic or blastic lesions noted in the visualized portions of the skeleton. Review of the MIP images confirms the above findings. IMPRESSION: 1. No evidence of pulmonary embolism. 2. No acute findings in the thorax to account for the patient's symptoms. 3. Post treatment related changes of mass-like fibrosis in the right lower lobe, similar to prior examinations. No findings to suggest local recurrence of disease or metastatic disease in the thorax. 4. Aortic atherosclerosis, in addition to 2 vessel coronary artery disease. Assessment for potential risk factor modification, dietary therapy or pharmacologic therapy may be warranted, if clinically indicated. 5. Mild diffuse bronchial wall thickening with mild centrilobular and paraseptal emphysema; imaging findings suggestive of underlying COPD. Aortic Atherosclerosis (ICD10-I70.0) and Emphysema (ICD10-J43.9). Electronically Signed   By: Vinnie Langton M.D.   On: 10/23/2018 06:55   Dg Chest Port 1 View  Result Date: 10/23/2018 CLINICAL DATA:  73 year old male with history of right lower rib and right upper quadrant abdominal pain. EXAM: PORTABLE CHEST 1 VIEW COMPARISON:  Chest x-ray 05/22/2017.  Chest CT 10/05/2018. FINDINGS: Ill-defined opacity in the right lower lobe corresponding to area of radiation fibrosis seen on recent chest CT 10/05/2018. Left lung is clear. No pleural effusions. No evidence of pulmonary edema. Heart size is normal. Upper mediastinal contours are within normal limits. Aortic atherosclerosis. IMPRESSION: 1. No radiographic evidence of acute cardiopulmonary disease. 2.  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 Electronically Signed   By: Vinnie Langton M.D.   On: 10/23/2018 06:06    ____________________________________________   PROCEDURES  Procedure(s) performed (including Critical Care):  Procedures   ____________________________________________   INITIAL IMPRESSION / ASSESSMENT AND PLAN / ED COURSE  As part of my medical decision making, I reviewed the following data within the [place] notes reviewed and incorporated, Labs reviewed, EKG interpreted, Old chart reviewed, Radiograph reviewed and Notes from prior ED visits     Marvin Williams. was evaluated in Emergency Department on 10/23/2018 for the symptoms described in the history of present illness. He was evaluated in the context of the global COVID-19 pandemic, which necessitated consideration that the patient might be at risk for infection with the SARS-CoV-2 virus that causes COVID-19. Institutional protocols and algorithms that pertain to the evaluation of patients at risk for COVID-19 are in a state of rapid change based on information released by regulatory bodies including the CDC and federal and state organizations. These policies and algorithms were followed during the patient's care in the ED.  73 year old male with lung cancer status post radiation who presents with right-sided pleuritic chest pain and cough. Differential includes, but is not limited to, viral syndrome, bronchitis including COPD exacerbation, pneumonia, reactive airway disease including asthma, CHF including exacerbation with or without pulmonary/interstitial edema, pneumothorax, ACS, thoracic trauma, and pulmonary embolism.  I personally reviewed patient's recent office visits with his PCP and oncology.  Looks like he has been having issues with renal insufficiency.  Leukocytosis noted on CBC which is new.  Does not look like he has been on  steroids recently. Will obtain blood cultures and lactic acid in addition to basic lab panel.  Consider CT angiogram for PE if patient's renal function can tolerate.  Did note patient had recent noncontrast CT chest on 5/7 which demonstrated posttreatment changes of the right lower lobe without acute findings.   Clinical Course as of Oct 22 656  Mon Oct 23, 2018  0175 Bladder scan reveals only 66 mL.   [JS]  [id number] Awaiting results of CT chest and urinalysis.  Anticipate hospitalization given leukocytosis and pleuritic chest pain.  Care transferred to Dr. Cherylann Banas at change of shift.   [JS]    Clinical Course User Index [JS] Paulette Blanch, MD     ____________________________________________   FINAL CLINICAL IMPRESSION(S) / ED DIAGNOSES  Final diagnoses:  Leukocytosis, unspecified type  Chest pain, unspecified type     ED Discharge Orders    None       Note:  This document was prepared using Dragon voice recognition software and may include unintentional dictation errors.   Paulette Blanch, MD 10/27/18 [phone]

## 2018-10-23 NOTE — Progress Notes (Signed)
Advanced care plan.  Purpose of the Encounter: CODE STATUS  Parties in Attendance: Patient himself  Patient's Decision Capacity: Intact  Subjective/Patient's story: Marvin Williams  is a 73 y.o. 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  Patient states that it is sharp in nature and comes and goes   Objective/Medical story I discussed with the patient regarding his desires for cardiac and pulmonary resuscitation explained him what that entails.  Also asked him about living will and healthcare partner   Goals of care determination:  Patient states that initially he would like everything to be done but would not want a prolonged resuscitation effort.   CODE STATUS: Full code   Time spent discussing advanced care planning: 16 minutes

## 2018-10-23 NOTE — ED Notes (Signed)
Admitting MD at bedside.

## 2018-10-23 NOTE — Progress Notes (Signed)
PHARMACY - PHYSICIAN COMMUNICATION CRITICAL VALUE ALERT - BLOOD CULTURE IDENTIFICATION (BCID)  Marvin Smeltz. is an 73 y.o. male who presented to [facility] on 10/23/2018 with a chief complaint of chest pain and urinary retention  Assessment:  Patient admitted with Dx of UTI  Name of physician (or Provider) Contacted: Dr. Verdell Carmine   Current antibiotics: Ceftriaxone   Changes to prescribed antibiotics recommended:  Ceftriaxone will be changed to meropenem until cultures and sensitivities are available   Results for orders placed or performed during the hospital encounter of 10/23/18  Blood Culture ID Panel (Reflexed) (Collected: 10/23/2018  5:56 AM)  Result Value Ref Range   Enterococcus species NOT DETECTED NOT DETECTED   Listeria monocytogenes NOT DETECTED NOT DETECTED   Staphylococcus species NOT DETECTED NOT DETECTED   Staphylococcus aureus (BCID) NOT DETECTED NOT DETECTED   Streptococcus species NOT DETECTED NOT DETECTED   Streptococcus agalactiae NOT DETECTED NOT DETECTED   Streptococcus pneumoniae NOT DETECTED NOT DETECTED   Streptococcus pyogenes NOT DETECTED NOT DETECTED   Acinetobacter baumannii NOT DETECTED NOT DETECTED   Enterobacteriaceae species DETECTED (A) NOT DETECTED   Enterobacter cloacae complex NOT DETECTED NOT DETECTED   Escherichia coli DETECTED (A) NOT DETECTED   Klebsiella oxytoca NOT DETECTED NOT DETECTED   Klebsiella pneumoniae NOT DETECTED NOT DETECTED   Proteus species NOT DETECTED NOT DETECTED   Serratia marcescens NOT DETECTED NOT DETECTED   Carbapenem resistance NOT DETECTED NOT DETECTED   Haemophilus influenzae NOT DETECTED NOT DETECTED   Neisseria meningitidis NOT DETECTED NOT DETECTED   Pseudomonas aeruginosa NOT DETECTED NOT DETECTED   Candida albicans NOT DETECTED NOT DETECTED   Candida glabrata NOT DETECTED NOT DETECTED   Candida krusei NOT DETECTED NOT DETECTED   Candida parapsilosis NOT DETECTED NOT DETECTED   Candida tropicalis NOT  DETECTED NOT DETECTED    Pernell Dupre, PharmD, BCPS Clinical Pharmacist 10/23/2018 6:34 PM

## 2018-10-23 NOTE — ED Notes (Signed)
Report to amber, rn.

## 2018-10-24 DIAGNOSIS — E785 Hyperlipidemia, unspecified: Secondary | ICD-10-CM | POA: Diagnosis present

## 2018-10-24 DIAGNOSIS — Z886 Allergy status to analgesic agent status: Secondary | ICD-10-CM | POA: Diagnosis not present

## 2018-10-24 DIAGNOSIS — R338 Other retention of urine: Secondary | ICD-10-CM | POA: Diagnosis present

## 2018-10-24 DIAGNOSIS — I10 Essential (primary) hypertension: Secondary | ICD-10-CM | POA: Diagnosis present

## 2018-10-24 DIAGNOSIS — A4151 Sepsis due to Escherichia coli [E. coli]: Secondary | ICD-10-CM | POA: Diagnosis present

## 2018-10-24 DIAGNOSIS — R079 Chest pain, unspecified: Secondary | ICD-10-CM | POA: Diagnosis present

## 2018-10-24 DIAGNOSIS — I70213 Atherosclerosis of native arteries of extremities with intermittent claudication, bilateral legs: Secondary | ICD-10-CM | POA: Diagnosis present

## 2018-10-24 DIAGNOSIS — Z923 Personal history of irradiation: Secondary | ICD-10-CM | POA: Diagnosis not present

## 2018-10-24 DIAGNOSIS — Z87891 Personal history of nicotine dependence: Secondary | ICD-10-CM | POA: Diagnosis not present

## 2018-10-24 DIAGNOSIS — Z809 Family history of malignant neoplasm, unspecified: Secondary | ICD-10-CM | POA: Diagnosis not present

## 2018-10-24 DIAGNOSIS — Z20828 Contact with and (suspected) exposure to other viral communicable diseases: Secondary | ICD-10-CM | POA: Diagnosis present

## 2018-10-24 DIAGNOSIS — Z7902 Long term (current) use of antithrombotics/antiplatelets: Secondary | ICD-10-CM | POA: Diagnosis not present

## 2018-10-24 DIAGNOSIS — N401 Enlarged prostate with lower urinary tract symptoms: Secondary | ICD-10-CM | POA: Diagnosis present

## 2018-10-24 DIAGNOSIS — N390 Urinary tract infection, site not specified: Secondary | ICD-10-CM | POA: Diagnosis present

## 2018-10-24 DIAGNOSIS — Z85118 Personal history of other malignant neoplasm of bronchus and lung: Secondary | ICD-10-CM | POA: Diagnosis not present

## 2018-10-24 DIAGNOSIS — Z9221 Personal history of antineoplastic chemotherapy: Secondary | ICD-10-CM | POA: Diagnosis not present

## 2018-10-24 MED ORDER — HYDROCODONE-ACETAMINOPHEN 5-325 MG PO TABS
1.0000 | ORAL_TABLET | Freq: Once | ORAL | Status: AC
  Administered 2018-10-24: 1 via ORAL
  Filled 2018-10-24: qty 1

## 2018-10-24 MED ORDER — IPRATROPIUM-ALBUTEROL 0.5-2.5 (3) MG/3ML IN SOLN
3.0000 mL | Freq: Four times a day (QID) | RESPIRATORY_TRACT | Status: DC | PRN
  Administered 2018-10-24: 22:00:00 3 mL via RESPIRATORY_TRACT
  Filled 2018-10-24: qty 3

## 2018-10-24 MED ORDER — POLYETHYLENE GLYCOL 3350 17 G PO PACK
17.0000 g | PACK | Freq: Once | ORAL | Status: AC
  Administered 2018-10-24: 14:00:00 17 g via ORAL
  Filled 2018-10-24: qty 1

## 2018-10-24 MED ORDER — GUAIFENESIN-DM 100-10 MG/5ML PO SYRP
5.0000 mL | ORAL_SOLUTION | ORAL | Status: DC | PRN
  Administered 2018-10-24: 22:00:00 5 mL via ORAL
  Filled 2018-10-24: qty 5

## 2018-10-24 NOTE — Progress Notes (Signed)
[facility] NAME: Marvin Williams    MR#:  [id number]  DATE OF BIRTH:  25-Jan-1946  SUBJECTIVE:  patient came in with difficulty urinating, increased frequency. He is currently ambulating with his IV pole. Denies any other complaints. Abdominal pain better. Ate his breakfast.  REVIEW OF SYSTEMS:   Review of Systems  Constitutional: Negative for chills, fever and weight loss.  HENT: Negative for ear discharge, ear pain and nosebleeds.   Eyes: Negative for blurred vision, pain and discharge.  Respiratory: Negative for sputum production, shortness of breath, wheezing and stridor.   Cardiovascular: Negative for chest pain, palpitations, orthopnea and PND.  Gastrointestinal: Negative for abdominal pain, diarrhea, nausea and vomiting.  Genitourinary: Positive for dysuria, flank pain, frequency and urgency.  Musculoskeletal: Negative for back pain and joint pain.  Neurological: Negative for sensory change, speech change, focal weakness and weakness.  Psychiatric/Behavioral: Negative for depression and hallucinations. The patient is not nervous/anxious.    Tolerating Diet:yes  Tolerating PT: self ambulatory  DRUG ALLERGIES:   Allergies  Allergen Reactions  . Naproxen Other (See Comments)    Per patient: oncologist prefers he does NOT take     VITALS:  Blood pressure 131/71, pulse (!) 105, temperature 98 F (36.7 C), temperature source Oral, resp. rate 18, height 5\' 9"  (1.753 m), weight 72.6 kg, SpO2 99 %.  PHYSICAL EXAMINATION:   Physical Exam  GENERAL:  73 y.o.-year-old patient lying in the bed with no acute distress.  EYES: Pupils equal, round, reactive to light and accommodation. No scleral icterus. Extraocular muscles intact.  HEENT: Head atraumatic, normocephalic. Oropharynx and nasopharynx clear.  NECK:  Supple, no jugular venous distention. No thyroid enlargement, no tenderness.  LUNGS: Normal breath sounds  bilaterally, no wheezing, rales, rhonchi. No use of accessory muscles of respiration.  CARDIOVASCULAR: S1, S2 normal. No murmurs, rubs, or gallops.  ABDOMEN: Soft, nontender, nondistended. Bowel sounds present. No organomegaly or mass.  EXTREMITIES: No cyanosis, clubbing or edema b/l.    NEUROLOGIC: Cranial nerves II through XII are intact. No focal Motor or sensory deficits b/l.   PSYCHIATRIC:  patient is alert and oriented x 3.  SKIN: No obvious rash, lesion, or ulcer.   LABORATORY PANEL:  CBC Recent Labs  Lab 10/23/18 0529  WBC 21.7*  22.3*  HGB 15.1  15.3  HCT 44.8  46.0  PLT 277  271    Chemistries  Recent Labs  Lab 10/23/18 0529  NA 134*  K 3.5  CL 100  CO2 21*  GLUCOSE 187*  BUN 21  CREATININE 1.42*  CALCIUM 9.0  AST 16  ALT 20  ALKPHOS 56  BILITOT 0.9   Cardiac Enzymes Recent Labs  Lab 10/23/18 0529  TROPONINI <0.03   RADIOLOGY:  Ct Angio Chest Pe W/cm &/or Wo Cm  Result Date: 10/23/2018 CLINICAL DATA:  73 year old male with right lower rib and right upper quadrant abdominal pain. EXAM: CT ANGIOGRAPHY CHEST WITH CONTRAST TECHNIQUE: Multidetector CT imaging of the chest was performed using the standard protocol during bolus administration of intravenous contrast. Multiplanar CT image reconstructions and MIPs were obtained to evaluate the vascular anatomy. CONTRAST:  52mL OMNIPAQUE IOHEXOL 350 MG/ML SOLN COMPARISON:  Chest CT 10/05/2018. FINDINGS: Cardiovascular: No filling defects in the pulmonary arterial tree to suggest underlying pulmonary embolism. Heart size is normal. There is no significant pericardial fluid, thickening or pericardial calcification. There is aortic atherosclerosis, as well as atherosclerosis of the great vessels of  the mediastinum and the coronary arteries, including calcified atherosclerotic plaque in the left anterior descending and left circumflex coronary arteries. Mediastinum/Nodes: No pathologically enlarged mediastinal or hilar  lymph nodes. Multiple densely calcified right hilar and mediastinal lymph nodes are incidentally noted. Esophagus is unremarkable in appearance. No axillary lymphadenopathy. Lungs/Pleura: Mass-like area of architectural distortion in the right lower lobe, similar to prior examinations, compatible with postradiation mass-like fibrosis. No definite suspicious appearing pulmonary nodules or masses are noted on today's examination. Several small calcified granulomas are noted. No acute consolidative airspace disease. No pleural effusions. Mild diffuse bronchial wall thickening with mild centrilobular and paraseptal emphysema. Upper Abdomen: 1.3 cm low-attenuation lesion in the central aspect of segment 8 of the liver, and smaller subcentimeter low-attenuation lesion in segment 2 of the liver, stable compared to the prior study, both likely small cysts. Exophytic simple cyst in the upper pole of the right kidney measuring 1.7 cm in diameter. Aortic atherosclerosis. Musculoskeletal: There are no aggressive appearing lytic or blastic lesions noted in the visualized portions of the skeleton. Review of the MIP images confirms the above findings. IMPRESSION: 1. No evidence of pulmonary embolism. 2. No acute findings in the thorax to account for the patient's symptoms. 3. Post treatment related changes of mass-like fibrosis in the right lower lobe, similar to prior examinations. No findings to suggest local recurrence of disease or metastatic disease in the thorax. 4. Aortic atherosclerosis, in addition to 2 vessel coronary artery disease. Assessment for potential risk factor modification, dietary therapy or pharmacologic therapy may be warranted, if clinically indicated. 5. Mild diffuse bronchial wall thickening with mild centrilobular and paraseptal emphysema; imaging findings suggestive of underlying COPD. Aortic Atherosclerosis (ICD10-I70.0) and Emphysema (ICD10-J43.9). Electronically Signed   By: Vinnie Langton M.D.    On: 10/23/2018 06:55   US Abdomen Complete  Result Date: 10/23/2018 CLINICAL DATA:  Abdominal pain EXAM: ABDOMEN ULTRASOUND COMPLETE COMPARISON:  None. FINDINGS: Gallbladder: No gallstones seen. No gallbladder wall thickening identified. Echogenic foci are seen at the gallbladder fundus, of uncertain etiology, most likely cholesterolosis of the gallbladder wall, less likely porcelain gallbladder, pneumobilia or emphysematous cholecystitis. Common bile duct: Diameter: 7 mm Liver: Patchy hyperechoic areas within the LEFT liver lobe suggesting geographic fatty infiltration. No focal mass or lesion appreciated within the liver. Portal vein is patent on color Doppler imaging with normal direction of blood flow towards the liver. IVC: No abnormality visualized. Pancreas: Mostly obscured. Spleen: Size and appearance within normal limits. Right Kidney: Length: 10.1 cm. Echogenicity within normal limits. Two renal cysts, largest measuring 6.4 cm. No hydronephrosis. Left Kidney: Length: 11.3 cm. Echogenicity within normal limits. 2 renal cysts identified, largest measuring 4.2 cm. No hydronephrosis. Abdominal aorta: No aneurysm visualized. Midportion of the abdominal aorta are obscured by overlying bowel gas. Other findings: None. IMPRESSION: 1. Amorphous echogenic foci at the gallbladder fundus, of uncertain etiology, statistically most likely cholesterolosis (adenomyomatosis). Additional considerations include porcelain gallbladder, pneumobilia and emphysematous cholecystitis. Recommend CT of the abdomen to exclude these less likely possibilities. 2. Fatty infiltration of the liver. 3. Renal cysts. 4. Abdominal aorta and pancreas incompletely visualized. Electronically Signed   By: Franki Cabot M.D.   On: 10/23/2018 13:47   Dg Chest Port 1 View  Result Date: 10/23/2018 CLINICAL DATA:  73 year old male with history of right lower rib and right upper quadrant abdominal pain. EXAM: PORTABLE CHEST 1 VIEW COMPARISON:   Chest x-ray 05/22/2017.  Chest CT 10/05/2018. FINDINGS: Ill-defined opacity in the right lower lobe corresponding to  area of radiation fibrosis seen on recent chest CT 10/05/2018. Left lung is clear. No pleural effusions. No evidence of pulmonary edema. Heart size is normal. Upper mediastinal contours are within normal limits. Aortic atherosclerosis. IMPRESSION: 1. No radiographic evidence of acute cardiopulmonary disease. 2. 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 Electronically Signed   By: Vinnie Langton M.D.   On: 10/23/2018 06:06   ASSESSMENT AND PLAN:  Kiran Carline  is a 73 y.o. 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    He also presents with complaint of urinary frequency hesitancy and burning.  1. E. coli sepsis source urine -IV meropenem -follow-up blood culture and urine culture sensitivity and narrow down antibiotics accordingly -now on Flomax -white count 22K  2. Right upper quadrant abdominal pain etiology unclear. Patient does not have PE. -Sound gallbladder results noted. I will curbside results with surgery tomorrow -says no pain today. His LFTs look normal. Tolerating PO diet without any difficulty  3. Hypertension continue hydrochlorothiazide/losartan  4. Hyperlipidemia on Lipitor  5. Lung cancer. Status post chemo and radiation therapy.--- Currently not getting any active treatment -chest x-ray shows post radiation changes.  Discussed with patient's daughter Lattie Haw on the phone   Management plans discussed with the patient, family and they are in agreement.  CODE STATUS: full  DVT Prophylaxis: Lovenox  TOTAL TIME TAKING CARE OF THIS PATIENT: *30** minutes.  >50% time spent on counselling and coordination of  care  POSSIBLE D/C IN 1 to 2** DAYS, DEPENDING ON CLINICAL CONDITION.  Note: This dictation was prepared with Dragon dictation along with smaller phrase technology. Any transcriptional errors that result from this process are unintentional.  Fritzi Mandes M.D on 10/24/2018 at 3:15 PM  Between 7am to 6pm - Pager - [phone]  After 6pm go to [email or web address] - password [email or web address]  Office  [phone]  CC: Primary care physician; Derinda Late, MDPatient ID: [name]., male   DOB: [birth year], 73 y.o.   MRN: [id number]

## 2018-10-25 LAB — CULTURE, BLOOD (ROUTINE X 2)

## 2018-10-25 LAB — CBC
HCT: 40.1 % (ref 39.0–52.0)
Hemoglobin: 13.3 g/dL (ref 13.0–17.0)
MCH: 29 pg (ref 26.0–34.0)
MCHC: 33.2 g/dL (ref 30.0–36.0)
MCV: 87.4 fL (ref 80.0–100.0)
Platelets: 236 10*3/uL (ref 150–400)
RBC: 4.59 MIL/uL (ref 4.22–5.81)
RDW: 13.7 % (ref 11.5–15.5)
WBC: 7.7 10*3/uL (ref 4.0–10.5)
nRBC: 0 % (ref 0.0–0.2)

## 2018-10-25 LAB — CREATININE, SERUM
Creatinine, Ser: 1.18 mg/dL (ref 0.61–1.24)
GFR calc Af Amer: >60 mL/min (ref >60)
GFR calc non Af Amer: >60 mL/min (ref >60)

## 2018-10-25 MED ORDER — AMOXICILLIN 500 MG PO CAPS: 500.0000 mg | ORAL_CAPSULE | Freq: Three times a day (TID) | ORAL | 0 refills | Status: AC

## 2018-10-25 MED ORDER — ALBUTEROL SULFATE HFA 108 (90 BASE) MCG/ACT IN AERS: 2.0000 | INHALATION_SPRAY | Freq: Four times a day (QID) | RESPIRATORY_TRACT | 2 refills | Status: DC | PRN

## 2018-10-25 MED ORDER — AMOXICILLIN 500 MG PO CAPS
500.0000 mg | ORAL_CAPSULE | Freq: Three times a day (TID) | ORAL | Status: DC
  Administered 2018-10-25: 11:00:00 500 mg via ORAL
  Filled 2018-10-25 (×2): qty 1

## 2018-10-25 MED ORDER — TAMSULOSIN HCL 0.4 MG PO CAPS: 0.4000 mg | ORAL_CAPSULE | Freq: Every day | ORAL | 1 refills | Status: DC

## 2018-10-25 NOTE — Progress Notes (Signed)
Nsg Discharge Note  Admit Date:  10/23/2018 Discharge date: 10/25/2018   [facility] to be D/C'd Home. per MD order.  AVS completed.  Copy for chart, and copy for patient signed, and dated. Patient/caregiver able to verbalize understanding.  Discharge Medication: Allergies as of 10/25/2018      Reactions   Naproxen Other (See Comments)   Per patient: oncologist prefers he does NOT take       Medication List    TAKE these medications   albuterol 108 (90 Base) MCG/ACT inhaler Commonly known as:  VENTOLIN HFA Inhale 2 puffs into the lungs every 6 (six) hours as needed for wheezing or shortness of breath.   amoxicillin 500 MG capsule Commonly known as:  AMOXIL Take 1 capsule (500 mg total) by mouth every 8 (eight) hours for 9 days.   atorvastatin 10 MG tablet Commonly known as:  Lipitor Take 1 tablet (10 mg total) by mouth daily.   clopidogrel 75 MG tablet Commonly known as:  Plavix Take 1 tablet (75 mg total) by mouth daily.   losartan-hydrochlorothiazide 100-12.5 MG tablet Commonly known as:  HYZAAR Take 1 tablet by mouth daily.   tamsulosin 0.4 MG Caps capsule Commonly known as:  FLOMAX Take 1 capsule (0.4 mg total) by mouth daily. Start taking on:  Oct 26, 2018       Discharge Assessment: Vitals:   10/24/18 1948 10/25/18 0419  BP: (!) 146/72 137/65  Pulse: 94 76  Resp: 16 20  Temp: 98.9 F (37.2 C) 98.4 F (36.9 C)  SpO2: 96% 97%   Skin clean, dry and intact without evidence of skin break down, no evidence of skin tears noted. IV catheter discontinued intact. Site without signs and symptoms of complications - no redness or edema noted at insertion site, patient denies c/o pain - only slight tenderness at site.  Dressing with slight pressure applied.  D/c Instructions-Education: Discharge instructions given to patient/family with verbalized understanding. D/c education completed with patient/family including follow up instructions, medication list, d/c  activities limitations if indicated, with other d/c instructions as indicated by MD - patient able to verbalize understanding, all questions fully answered. Patient instructed to return to ED, call 911, or call MD for any changes in condition.  Patient escorted via [place], and D/C home via private auto.  Eda Keys, RN 10/25/2018 11:40 AM

## 2018-10-25 NOTE — Discharge Summary (Signed)
[place] [facility] [place] NAME: Marvin Williams    MR#:  [id number]  DATE OF BIRTH:  [birth year]  DATE OF ADMISSION:  10/23/2018 ADMITTING PHYSICIAN: Dustin Flock, MD  DATE OF DISCHARGE: 10/25/2018  PRIMARY CARE PHYSICIAN: Derinda Late, MD    ADMISSION DIAGNOSIS:  Abdominal pain [R10.9] Urinary tract infection without hematuria, site unspecified [N39.0] Leukocytosis, unspecified type [D72.829] Chest pain, unspecified type [R07.9]  DISCHARGE DIAGNOSIS:   E coli sepsis E coli UTI  SECONDARY DIAGNOSIS:   Past Medical History:  Diagnosis Date  . Cancer ([place])    skin  . HTN (hypertension)   . Hyperlipemia   . Lung cancer ([place])   . Peripheral arterial disease [facility])     HOSPITAL COURSE:   JamesPaigeis a73 y.o.malewith a known history of squamous cell carcinoma of the lung stage IIb,hypertension, hyperlipidemia and peripheral artery disease who presents to the hospital with complaint of right lower chest pain/right upper quadrant abdominal pain.  He also presents with complaint of urinary frequency hesitancy and burning.  1. E. coli sepsis/nbacterimia source urine -IV meropenem--change to po Ampicillin per C/S results -now on Flomax -white count 22K--7K -afebrile, back to baseline, improved very quickly. Requesting to go home  2. Right upper quadrant abdominal pain etiology unclear. Patient does not have PE. -ultrasound gallbladder results noted. -says no pain today. His LFTs look normal. Tolerating PO diet without any difficulty discussed with patient and daughter Lattie Haw on the phone. She is patient is asymptomatic I will hold off on any surgical consultation. They can follow-up as outpatient if need be.  3. Hypertension - continue hydrochlorothiazide/losartan  4. Hyperlipidemia on Lipitor  5. Lung cancer. Status post chemo and radiation therapy.--- Currently not getting any active treatment -chest x-ray shows  post radiation changes.  6. Possible BPH -now on Flomax. I discussed with patient and daughter to have PCP do prostate evaluation as outpatient and if need be considered urology consultation.  Discussed with patient's daughter Lattie Haw on the phone--- patient is back at baseline. He will discharged to home. CONSULTS OBTAINED:    DRUG ALLERGIES:   Allergies  Allergen Reactions  . Naproxen Other (See Comments)    Per patient: oncologist prefers he does NOT take     DISCHARGE MEDICATIONS:   Allergies as of 10/25/2018      Reactions   Naproxen Other (See Comments)   Per patient: oncologist prefers he does NOT take       Medication List    TAKE these medications   albuterol 108 (90 Base) MCG/ACT inhaler Commonly known as:  VENTOLIN HFA Inhale 2 puffs into the lungs every 6 (six) hours as needed for wheezing or shortness of breath.   amoxicillin 500 MG capsule Commonly known as:  AMOXIL Take 1 capsule (500 mg total) by mouth every 8 (eight) hours for 9 days.   atorvastatin 10 MG tablet Commonly known as:  Lipitor Take 1 tablet (10 mg total) by mouth daily.   clopidogrel 75 MG tablet Commonly known as:  Plavix Take 1 tablet (75 mg total) by mouth daily.   losartan-hydrochlorothiazide 100-12.5 MG tablet Commonly known as:  HYZAAR Take 1 tablet by mouth daily.   tamsulosin 0.4 MG Caps capsule Commonly known as:  FLOMAX Take 1 capsule (0.4 mg total) by mouth daily. Start taking on:  Oct 26, 2018       If you experience worsening of your admission symptoms, develop shortness of breath, life threatening emergency,  suicidal or homicidal thoughts you must seek medical attention immediately by calling 911 or calling your MD immediately  if symptoms less severe.  You Must read complete instructions/literature along with all the possible adverse reactions/side effects for all the Medicines you take and that have been prescribed to you. Take any new Medicines after you have  completely understood and accept all the possible adverse reactions/side effects.   Please note  You were cared for by a hospitalist during your hospital stay. If you have any questions about your discharge medications or the care you received while you were in the hospital after you are discharged, you can call the unit and asked to speak with the hospitalist on call if the hospitalist that took care of you is not available. Once you are discharged, your primary care physician will handle any further medical issues. Please note that NO REFILLS for any discharge medications will be authorized once you are discharged, as it is imperative that you return to your primary care physician (or establish a relationship with a primary care physician if you do not have one) for your aftercare needs so that they can reassess your need for medications and monitor your lab values. Today   SUBJECTIVE   Doing well. Feels so much better and wants to go home. Urine cleared up Ambulating by self in the hallway  VITAL SIGNS:  Blood pressure 137/65, pulse 76, temperature 98.4 F (36.9 C), temperature source Oral, resp. rate 20, height 5\' 9"  (1.753 m), weight 72.6 kg, SpO2 97 %.  I/O:    Intake/Output Summary (Last 24 hours) at 10/25/2018 0902 Last data filed at 10/25/2018 0520 Gross per 24 hour  Intake 1984.39 ml  Output 1325 ml  Net 659.39 ml    PHYSICAL EXAMINATION:  GENERAL:  73 y.o.-year-old patient lying in the bed with no acute distress.  EYES: Pupils equal, round, reactive to light and accommodation. No scleral icterus. Extraocular muscles intact.  HEENT: Head atraumatic, normocephalic. Oropharynx and nasopharynx clear.  NECK:  Supple, no jugular venous distention. No thyroid enlargement, no tenderness.  LUNGS: Normal breath sounds bilaterally, no wheezing, rales,rhonchi or crepitation. No use of accessory muscles of respiration.  CARDIOVASCULAR: S1, S2 normal. No murmurs, rubs, or gallops.   ABDOMEN: Soft, non-tender, non-distended. Bowel sounds present. No organomegaly or mass.  EXTREMITIES: No pedal edema, cyanosis, or clubbing.  NEUROLOGIC: Cranial nerves II through XII are intact. Muscle strength 5/5 in all extremities. Sensation intact. Gait not checked.  PSYCHIATRIC: The patient is alert and oriented x 3.  SKIN: No obvious rash, lesion, or ulcer.   DATA REVIEW:   CBC  Recent Labs  Lab 10/25/18 0557  WBC 7.7  HGB 13.3  HCT 40.1  PLT 236    Chemistries  Recent Labs  Lab 10/23/18 0529 10/25/18 0557  NA 134*  --   K 3.5  --   CL 100  --   CO2 21*  --   GLUCOSE 187*  --   BUN 21  --   CREATININE 1.42* 1.18  CALCIUM 9.0  --   AST 16  --   ALT 20  --   ALKPHOS 56  --   BILITOT 0.9  --     Microbiology Results   Recent Results (from the past 240 hour(s))  SARS Coronavirus 2 (CEPHEID - Performed in [facility] hospital lab), Hosp Order     Status: None   Collection Time: 10/23/18  5:39 AM  Result Value Ref Range  Status   SARS Coronavirus 2 NEGATIVE NEGATIVE Final    Comment: (NOTE) If result is NEGATIVE SARS-CoV-2 target nucleic acids are NOT DETECTED. The SARS-CoV-2 RNA is generally detectable in upper and lower  respiratory specimens during the acute phase of infection. The lowest  concentration of SARS-CoV-2 viral copies this assay can detect is 250  copies / mL. A negative result does not preclude SARS-CoV-2 infection  and should not be used as the sole basis for treatment or other  patient management decisions.  A negative result may occur with  improper specimen collection / handling, submission of specimen other  than nasopharyngeal swab, presence of viral mutation(s) within the  areas targeted by this assay, and inadequate number of viral copies  (<250 copies / mL). A negative result must be combined with clinical  observations, patient history, and epidemiological information. If result is POSITIVE SARS-CoV-2 target nucleic acids are  DETECTED. The SARS-CoV-2 RNA is generally detectable in upper and lower  respiratory specimens dur ing the acute phase of infection.  Positive  results are indicative of active infection with SARS-CoV-2.  Clinical  correlation with patient history and other diagnostic information is  necessary to determine patient infection status.  Positive results do  not rule out bacterial infection or co-infection with other viruses. If result is PRESUMPTIVE POSTIVE SARS-CoV-2 nucleic acids MAY BE PRESENT.   A presumptive positive result was obtained on the submitted specimen  and confirmed on repeat testing.  While 2019 novel coronavirus  (SARS-CoV-2) nucleic acids may be present in the submitted sample  additional confirmatory testing may be necessary for epidemiological  and / or clinical management purposes  to differentiate between  SARS-CoV-2 and other Sarbecovirus currently known to infect humans.  If clinically indicated additional testing with an alternate test  methodology [phone]) is advised. The SARS-CoV-2 RNA is generally  detectable in upper and lower respiratory sp ecimens during the acute  phase of infection. The expected result is Negative. Fact Sheet for Patients:  StrictlyIdeas.no Fact Sheet for Healthcare Providers: BankingDealers.co.za This test is not yet approved or cleared by the [place] FDA and has been authorized for detection and/or diagnosis of SARS-CoV-2 by FDA under an Emergency Use Authorization (EUA).  This EUA will remain in effect (meaning this test can be used) for the duration of the COVID-19 declaration under Section 564(b)(1) of the Act, 21 U.S.C. section 360bbb-3(b)(1), unless the authorization is terminated or revoked sooner. Performed at [facility], [address] [place], [place] [address]   Culture, blood (routine x 2)     Status: None (Preliminary result)   Collection Time: 10/23/18   5:56 AM  Result Value Ref Range Status   Specimen Description BLOOD RIGHT WRIST  Final   Special Requests   Final    BOTTLES DRAWN AEROBIC AND ANAEROBIC Blood Culture results may not be optimal due to an excessive volume of blood received in culture bottles   Culture   Final    NO GROWTH 2 DAYS Performed at [facility], [address], [place], [place] [address]    Report Status PENDING  Incomplete  Culture, blood (routine x 2)     Status: Abnormal   Collection Time: 10/23/18  5:56 AM  Result Value Ref Range Status   Specimen Description   Final    BLOOD LEFT ANTECUBITAL Performed at [facility], [address], [place], [place] [address]    Special Requests   Final    BOTTLES  DRAWN AEROBIC AND ANAEROBIC Blood Culture results may not be optimal due to an excessive volume of blood received in culture bottles Performed at [facility], [address] [place], [place] [address]    Culture  Setup Time   Final    GRAM NEGATIVE RODS IN BOTH AEROBIC AND ANAEROBIC BOTTLES CRITICAL RESULT CALLED TO, READ BACK BY AND VERIFIED WITH: S HALLAJI 10/23/2018 AT 1805 BY H SOEWARDIMAN Performed at [facility], [place] [address] [place], [place] [address]    Culture ESCHERICHIA COLI (A)  Final   Report Status 10/25/2018 FINAL  Final   Organism ID, Bacteria ESCHERICHIA COLI  Final      Susceptibility   Escherichia coli - MIC*    AMPICILLIN <=2 SENSITIVE Sensitive     CEFAZOLIN <=4 SENSITIVE Sensitive     CEFEPIME <=1 SENSITIVE Sensitive     CEFTAZIDIME <=1 SENSITIVE Sensitive     CEFTRIAXONE <=1 SENSITIVE Sensitive     CIPROFLOXACIN <=0.25 SENSITIVE Sensitive     GENTAMICIN <=1 SENSITIVE Sensitive     IMIPENEM <=0.25 SENSITIVE Sensitive     TRIMETH/SULFA <=20 SENSITIVE Sensitive     AMPICILLIN/SULBACTAM <=2 SENSITIVE Sensitive     PIP/TAZO <=4 SENSITIVE Sensitive     Extended ESBL NEGATIVE Sensitive     * ESCHERICHIA COLI  Blood Culture ID Panel  (Reflexed)     Status: Abnormal   Collection Time: 10/23/18  5:56 AM  Result Value Ref Range Status   Enterococcus species NOT DETECTED NOT DETECTED Final   Listeria monocytogenes NOT DETECTED NOT DETECTED Final   Staphylococcus species NOT DETECTED NOT DETECTED Final   Staphylococcus aureus (BCID) NOT DETECTED NOT DETECTED Final   Streptococcus species NOT DETECTED NOT DETECTED Final   Streptococcus agalactiae NOT DETECTED NOT DETECTED Final   Streptococcus pneumoniae NOT DETECTED NOT DETECTED Final   Streptococcus pyogenes NOT DETECTED NOT DETECTED Final   Acinetobacter baumannii NOT DETECTED NOT DETECTED Final   Enterobacteriaceae species DETECTED (A) NOT DETECTED Final    Comment: Enterobacteriaceae represent a large family of gram-negative bacteria, not a single organism. CRITICAL RESULT CALLED TO, READ BACK BY AND VERIFIED WITH: S HALLAJI 10/23/2018 AT 1805 BY H SOEWARDIMAN    Enterobacter cloacae complex NOT DETECTED NOT DETECTED Final   Escherichia coli DETECTED (A) NOT DETECTED Final    Comment: CRITICAL RESULT CALLED TO, READ BACK BY AND VERIFIED WITH: S HALLAJI 10/23/2018 AT 1805 BY H SOEWARDIMAN    Klebsiella oxytoca NOT DETECTED NOT DETECTED Final   Klebsiella pneumoniae NOT DETECTED NOT DETECTED Final   Proteus species NOT DETECTED NOT DETECTED Final   Serratia marcescens NOT DETECTED NOT DETECTED Final   Carbapenem resistance NOT DETECTED NOT DETECTED Final   Haemophilus influenzae NOT DETECTED NOT DETECTED Final   Neisseria meningitidis NOT DETECTED NOT DETECTED Final   Pseudomonas aeruginosa NOT DETECTED NOT DETECTED Final   Candida albicans NOT DETECTED NOT DETECTED Final   Candida glabrata NOT DETECTED NOT DETECTED Final   Candida krusei NOT DETECTED NOT DETECTED Final   Candida parapsilosis NOT DETECTED NOT DETECTED Final   Candida tropicalis NOT DETECTED NOT DETECTED Final    Comment: Performed at [facility], [address] [place], [place] [address]     RADIOLOGY:  US Abdomen Complete  Result Date: 10/23/2018 CLINICAL DATA:  Abdominal pain EXAM: ABDOMEN ULTRASOUND COMPLETE COMPARISON:  None. FINDINGS: Gallbladder: No gallstones seen. No gallbladder wall thickening identified. Echogenic foci are seen at the gallbladder fundus, of uncertain  etiology, most likely cholesterolosis of the gallbladder wall, less likely porcelain gallbladder, pneumobilia or emphysematous cholecystitis. Common bile duct: Diameter: 7 mm Liver: Patchy hyperechoic areas within the LEFT liver lobe suggesting geographic fatty infiltration. No focal mass or lesion appreciated within the liver. Portal vein is patent on color Doppler imaging with normal direction of blood flow towards the liver. IVC: No abnormality visualized. Pancreas: Mostly obscured. Spleen: Size and appearance within normal limits. Right Kidney: Length: 10.1 cm. Echogenicity within normal limits. Two renal cysts, largest measuring 6.4 cm. No hydronephrosis. Left Kidney: Length: 11.3 cm. Echogenicity within normal limits. 2 renal cysts identified, largest measuring 4.2 cm. No hydronephrosis. Abdominal aorta: No aneurysm visualized. Midportion of the abdominal aorta are obscured by overlying bowel gas. Other findings: None. IMPRESSION: 1. Amorphous echogenic foci at the gallbladder fundus, of uncertain etiology, statistically most likely cholesterolosis (adenomyomatosis). Additional considerations include porcelain gallbladder, pneumobilia and emphysematous cholecystitis. Recommend CT of the abdomen to exclude these less likely possibilities. 2. Fatty infiltration of the liver. 3. Renal cysts. 4. Abdominal aorta and pancreas incompletely visualized. Electronically Signed   By: Franki Cabot M.D.   On: 10/23/2018 13:47     CODE STATUS:     Code Status Orders  (From admission, onward)         Start     Ordered   10/23/18 0947  Full code  Continuous     10/23/18 0946        Code Status History    Date  Active Date Inactive Code Status Order ID Comments User Context   05/08/2018 1014 05/08/2018 1505 Full Code [id number]  Algernon Huxley, MD Inpatient   05/01/2018 1012 05/01/2018 1458 Full Code [id number]  Algernon Huxley, MD Inpatient    Advance Directive Documentation     Most Recent Value  Type of Advance Directive  Healthcare Power of Attorney  Pre-existing out of facility DNR order (yellow form or pink MOST form)  -  "MOST" Form in Place?  -      TOTAL TIME TAKING CARE OF THIS PATIENT: *40* minutes.    Fritzi Mandes M.D on 10/25/2018 at 9:02 AM  Between 7am to 6pm - Pager - [phone] After 6pm go to [email or web address] - password [email or web address] [facility]  Office  [phone]  CC: Primary care physician; Derinda Late, MD

## 2018-10-28 LAB — CULTURE, BLOOD (ROUTINE X 2): Culture: NO GROWTH

## 2019-01-02 IMAGING — CT CT CHEST W/O CM
2 of 4 series · 15 of 36 positions shown, 18 images · non-contrast
Comparison: 12/06/2017.

CLINICAL DATA: Cough.  Lung cancer.  Radiation therapy only.

EXAM:
CT CHEST WITHOUT CONTRAST
TECHNIQUE: Multidetector CT imaging of the chest was performed following the
standard protocol without IV contrast.

[Series 2: chest · axial · 0.64mm/px · z∈[-1235,-957]mm · 12 of 165 slices shown, 15 images (1 of 2)]
[im 13/165  mediastinal]
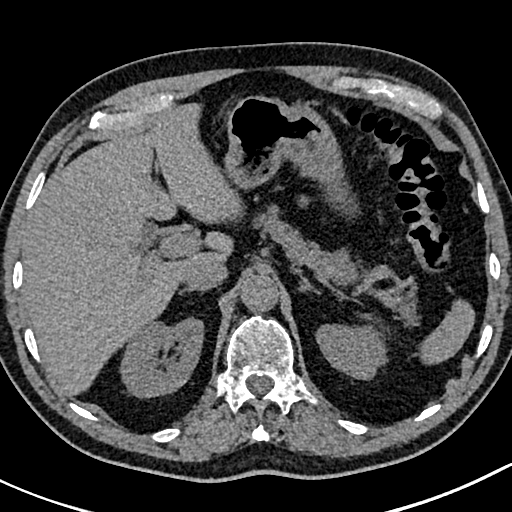
[im 13/165  lung]
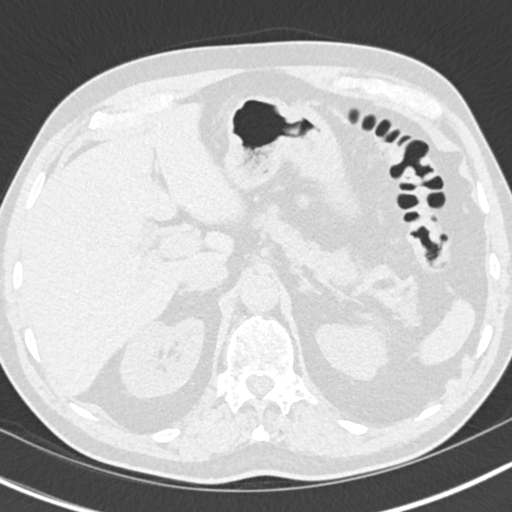
[im 26/165  lung]
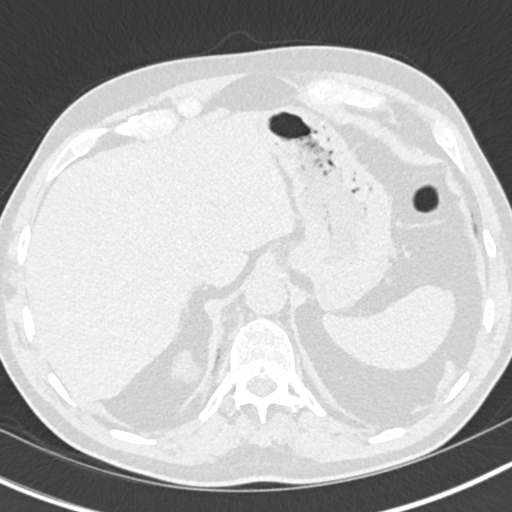
[im 38/165  lung]
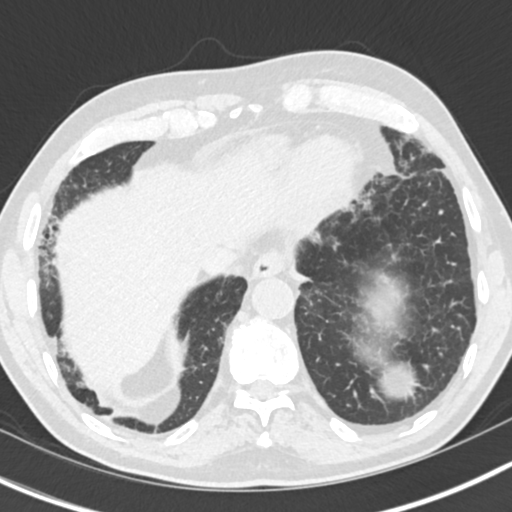
[im 51/165  lung]
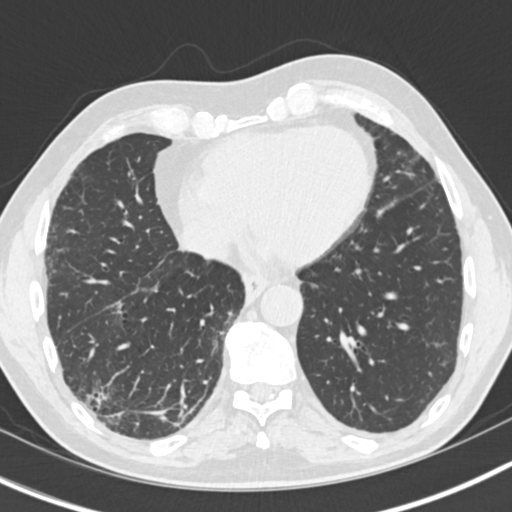
[im 64/165  mediastinal]
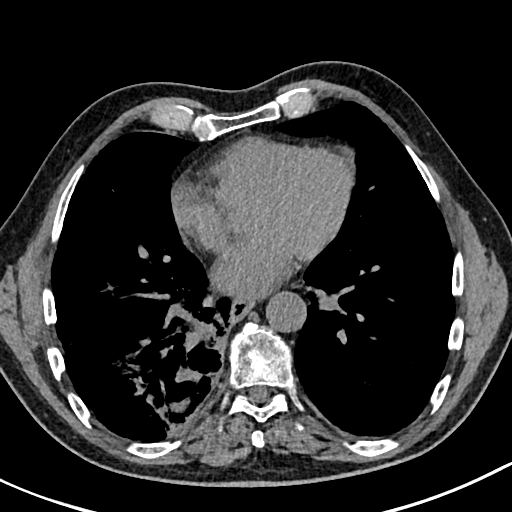
[im 64/165  lung]
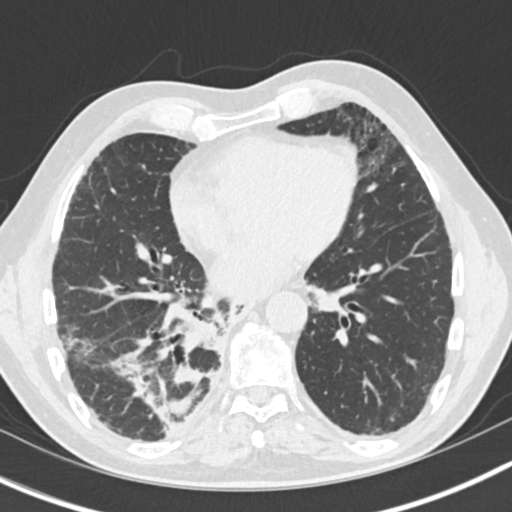
[im 76/165  lung]
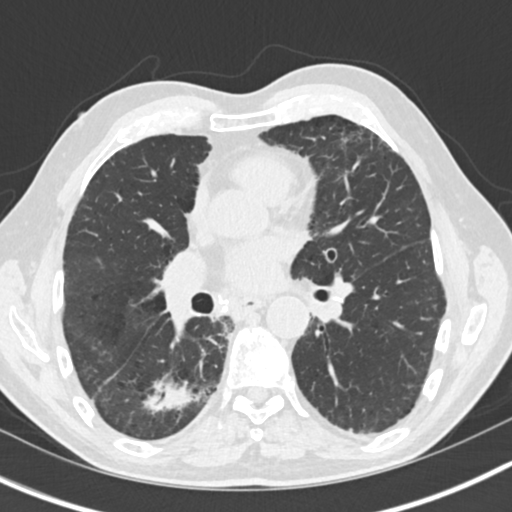
[im 89/165  lung]
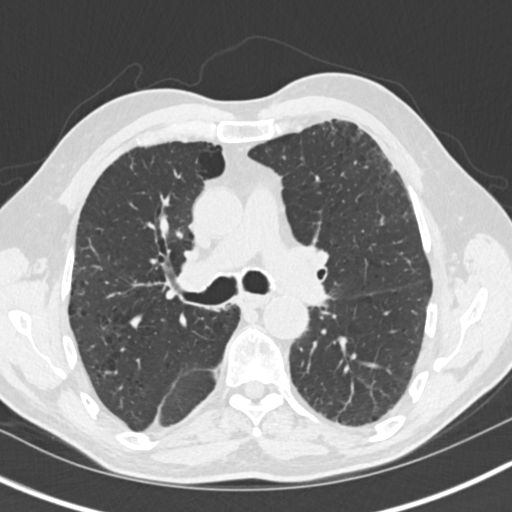
[im 101/165  lung]
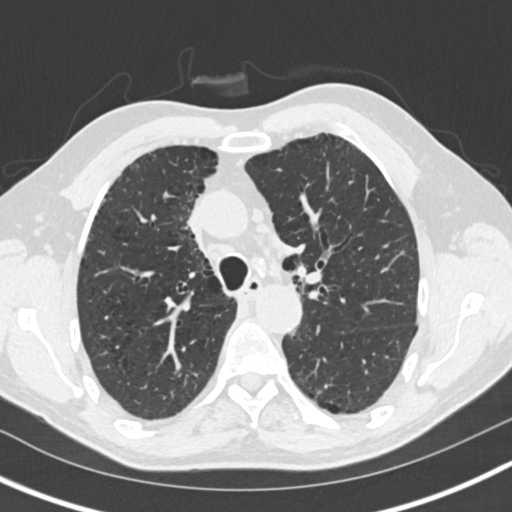
[im 114/165  mediastinal]
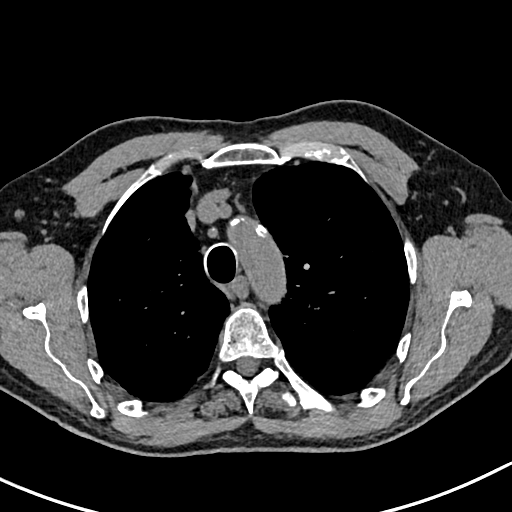
[im 114/165  lung]
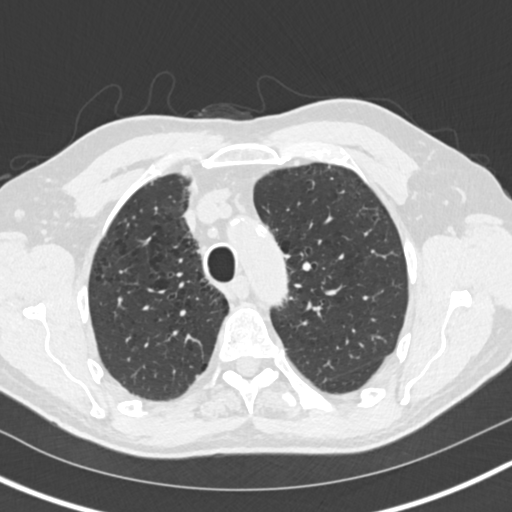
[im 127/165  lung]
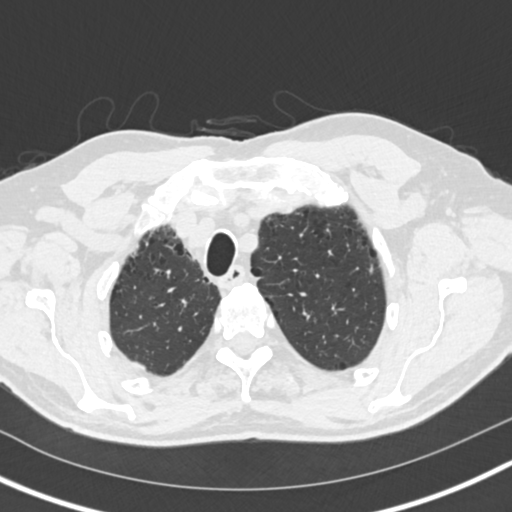
[im 139/165  lung]
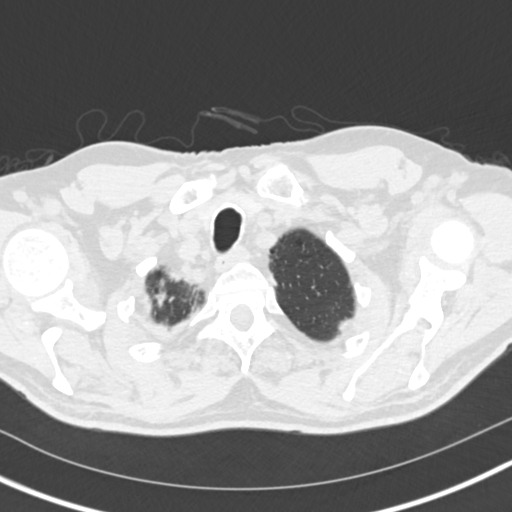
[im 152/165  lung]
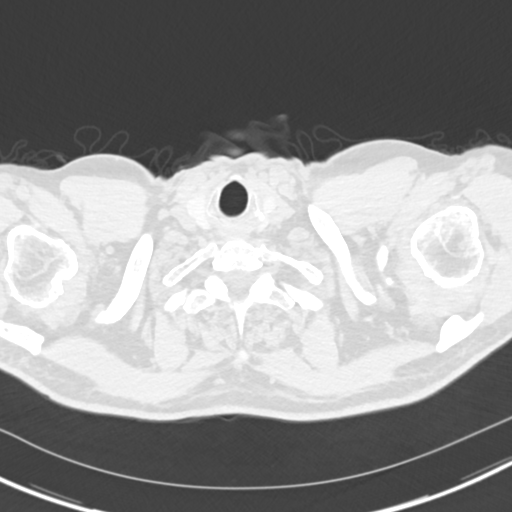

[Series 5: chest · coronal · 0.64mm/px · 3 of 157 slices shown (2 of 2)]
[im 32/157  lung]
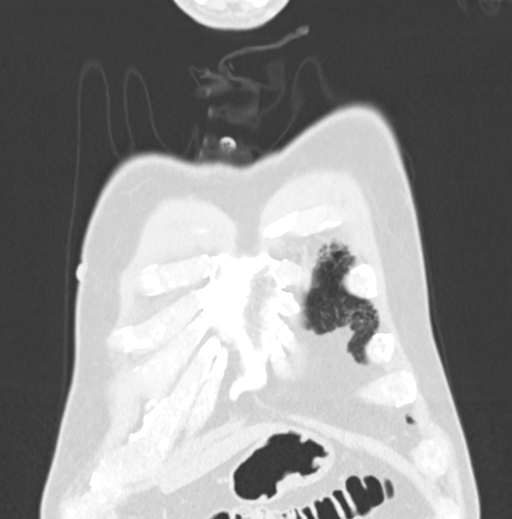
[im 63/157  lung]
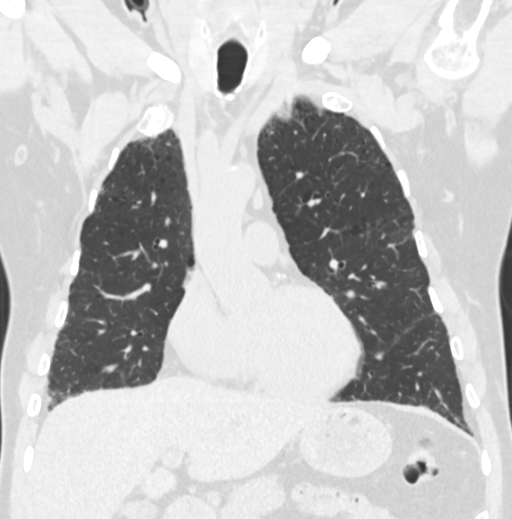
[im 94/157  lung]
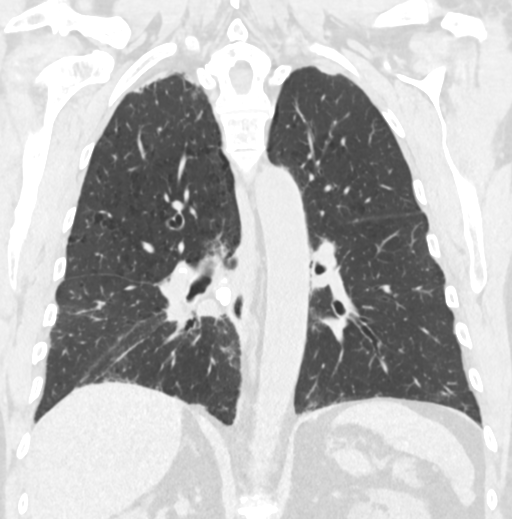

[15 of 36 positions shown; findings below may reference images not displayed]

FINDINGS: Cardiovascular: Atherosclerotic calcification of the arterial
vasculature, including coronary arteries. Heart is at the upper
limits of normal in size. No pericardial effusion.

Mediastinum/Nodes: No pathologically enlarged mediastinal lymph
nodes. Calcified mediastinal and hilar lymph nodes. No axillary
adenopathy. Esophagus is grossly unremarkable.

Lungs/Pleura: Centrilobular and paraseptal emphysema. Patchy
consolidation, bronchiectasis, architectural distortion and volume
loss surround a right lower lobe mass, measuring 2.0 x 3.2 cm,
grossly stable. Post treatment changes appear more organized than on
the prior study. Basilar subpleural ground-glass bronchiolectasis
and mild reticulation, right greater than left, as before. Calcified
granulomas. 3 mm left upper lobe nodule (series 3, image 38),
unchanged. No pleural fluid. Airway is unremarkable.

Upper Abdomen: Low-attenuation lesions liver measure up to 1.4 cm,
before. Contracted gallbladder fundus as before. Visualized portions
the adrenal glands are unremarkable. Low-attenuation lesions in the
kidneys measure up to 3.8 cm on the left, partially imaged. There
may be a partially imaged exophytic left renal sinus cyst versus
extrarenal pelvis. Visualized portions of the spleen, pancreas,
stomach and bowel are grossly unremarkable. No upper abdominal
adenopathy.

Musculoskeletal: No worrisome lytic or sclerotic lesions.
IMPRESSION: 1. Evolving post radiation changes in the right lower lobe
surrounding a stable right lower lobe mass. No evidence metastatic
disease.
2. Mild basilar subpleural ground-glass, reticulation and traction
bronchiolectasis are indicative of interstitial lung disease.
3. Aortic atherosclerosis ([id number]-170.0). Coronary artery
calcification.
4.  Emphysema ([id number].I).

## 2019-01-08 ENCOUNTER — Other Ambulatory Visit: Payer: Self-pay

## 2019-01-08 ENCOUNTER — Ambulatory Visit
Admission: RE | Admit: 2019-01-08 | Discharge: 2019-01-08 | Disposition: A | Discharge status: Home / Self Care | Payer: Medicare Other | Source: Ambulatory Visit | Attending: Oncology | Admitting: Oncology

## 2019-01-08 DIAGNOSIS — C3491 Malignant neoplasm of unspecified part of right bronchus or lung: Secondary | ICD-10-CM

## 2019-01-10 ENCOUNTER — Other Ambulatory Visit: Payer: Self-pay

## 2019-01-10 ENCOUNTER — Telehealth (INDEPENDENT_AMBULATORY_CARE_PROVIDER_SITE_OTHER): Payer: Self-pay | Admitting: Nurse Practitioner

## 2019-01-10 ENCOUNTER — Inpatient Hospital Stay (HOSPITAL_BASED_OUTPATIENT_CLINIC_OR_DEPARTMENT_OTHER): Payer: Medicare Other | Admitting: Oncology

## 2019-01-10 ENCOUNTER — Inpatient Hospital Stay: Payer: Medicare Other | Attending: Oncology

## 2019-01-10 ENCOUNTER — Encounter: Payer: Self-pay | Admitting: Oncology

## 2019-01-10 VITALS — BP 122/70 | HR 70 | Temp 95.3°F | Resp 18 | Wt 173.3 lb

## 2019-01-10 DIAGNOSIS — J439 Emphysema, unspecified: Secondary | ICD-10-CM | POA: Insufficient documentation

## 2019-01-10 DIAGNOSIS — R7989 Other specified abnormal findings of blood chemistry: Secondary | ICD-10-CM

## 2019-01-10 DIAGNOSIS — R3911 Hesitancy of micturition: Secondary | ICD-10-CM | POA: Diagnosis not present

## 2019-01-10 DIAGNOSIS — I10 Essential (primary) hypertension: Secondary | ICD-10-CM | POA: Insufficient documentation

## 2019-01-10 DIAGNOSIS — Z79899 Other long term (current) drug therapy: Secondary | ICD-10-CM | POA: Diagnosis not present

## 2019-01-10 DIAGNOSIS — C3491 Malignant neoplasm of unspecified part of right bronchus or lung: Secondary | ICD-10-CM | POA: Diagnosis present

## 2019-01-10 DIAGNOSIS — I739 Peripheral vascular disease, unspecified: Secondary | ICD-10-CM

## 2019-01-10 DIAGNOSIS — E875 Hyperkalemia: Secondary | ICD-10-CM | POA: Insufficient documentation

## 2019-01-10 DIAGNOSIS — Z87891 Personal history of nicotine dependence: Secondary | ICD-10-CM | POA: Diagnosis not present

## 2019-01-10 DIAGNOSIS — E041 Nontoxic single thyroid nodule: Secondary | ICD-10-CM | POA: Diagnosis not present

## 2019-01-10 DIAGNOSIS — Z9119 Patient's noncompliance with other medical treatment and regimen: Secondary | ICD-10-CM | POA: Insufficient documentation

## 2019-01-10 DIAGNOSIS — N401 Enlarged prostate with lower urinary tract symptoms: Secondary | ICD-10-CM | POA: Diagnosis not present

## 2019-01-10 DIAGNOSIS — E785 Hyperlipidemia, unspecified: Secondary | ICD-10-CM | POA: Diagnosis not present

## 2019-01-10 LAB — COMPREHENSIVE METABOLIC PANEL
ALT: 16 U/L (ref 0–44)
AST: 15 U/L (ref 15–41)
Albumin: 3.7 g/dL (ref 3.5–5.0)
Alkaline Phosphatase: 58 U/L (ref 38–126)
Anion gap: 8 (ref 5–15)
BUN: 24 mg/dL — ABNORMAL HIGH (ref 8–23)
CO2: 25 mmol/L (ref 22–32)
Calcium: 9.4 mg/dL (ref 8.9–10.3)
Chloride: 104 mmol/L (ref 98–111)
Creatinine, Ser: 1.57 mg/dL — ABNORMAL HIGH (ref 0.61–1.24)
GFR calc Af Amer: 50 mL/min — ABNORMAL LOW (ref >60)
GFR calc non Af Amer: 43 mL/min — ABNORMAL LOW (ref >60)
Glucose, Bld: 109 mg/dL — ABNORMAL HIGH (ref 70–99)
Potassium: 5.3 mmol/L — ABNORMAL HIGH (ref 3.5–5.1)
Sodium: 137 mmol/L (ref 135–145)
Total Bilirubin: 0.6 mg/dL (ref 0.3–1.2)
Total Protein: 7.5 g/dL (ref 6.5–8.1)

## 2019-01-10 LAB — CBC WITH DIFFERENTIAL/PLATELET
Abs Immature Granulocytes: 0.15 10*3/uL — ABNORMAL HIGH (ref 0.00–0.07)
Basophils Absolute: 0.1 10*3/uL (ref 0.0–0.1)
Basophils Relative: 1 %
Eosinophils Absolute: 0.3 10*3/uL (ref 0.0–0.5)
Eosinophils Relative: 2 %
HCT: 38.7 % — ABNORMAL LOW (ref 39.0–52.0)
Hemoglobin: 12.7 g/dL — ABNORMAL LOW (ref 13.0–17.0)
Immature Granulocytes: 1 %
Lymphocytes Relative: 13 %
Lymphs Abs: 1.5 10*3/uL (ref 0.7–4.0)
MCH: 29.3 pg (ref 26.0–34.0)
MCHC: 32.8 g/dL (ref 30.0–36.0)
MCV: 89.4 fL (ref 80.0–100.0)
Monocytes Absolute: 1 10*3/uL (ref 0.1–1.0)
Monocytes Relative: 10 %
Neutro Abs: 8 10*3/uL — ABNORMAL HIGH (ref 1.7–7.7)
Neutrophils Relative %: 73 %
Platelets: 352 10*3/uL (ref 150–400)
RBC: 4.33 MIL/uL (ref 4.22–5.81)
RDW: 15.2 % (ref 11.5–15.5)
WBC: 10.9 10*3/uL — ABNORMAL HIGH (ref 4.0–10.5)
nRBC: 0 % (ref 0.0–0.2)

## 2019-01-10 MED ORDER — TAMSULOSIN HCL 0.4 MG PO CAPS: 0.4000 mg | ORAL_CAPSULE | Freq: Every day | ORAL | 1 refills | Status: DC

## 2019-01-10 NOTE — Telephone Encounter (Signed)
The patient does have some peripheral artery disease and so he definitely needs to continue taking his atorvastatin and Plavix.  Atorvastatin generally will not bear any issues with you bruising, nor what creatinine issue during his surgery.  He should restart his Plavix 2 days after his procedure.

## 2019-01-10 NOTE — Telephone Encounter (Signed)
Attempted to contact patient with no answer. Unable to leave voicemail. AS, CMA

## 2019-01-10 NOTE — Telephone Encounter (Signed)
Spoke with patient and advised him of the below. Patient verbalized understanding. AS, CMA

## 2019-01-10 NOTE — Progress Notes (Signed)
Hematology/Oncology Follow up note [facility] Telephone:(336) [phone] [phone]   Patient Care Team: Derinda Late, MD as PCP - General (Family Medicine) Telford Nab, RN as Registered Nurse  . REASON FOR VISIT Follow up for treatment of squamous lung cancer, thyroid nodule HISTORY OF PRESENTING ILLNESS:  Marvin Raczka. is a  73 y.o.  male follows up for management of squamous lung cancer, stage IIB (cT3,cN0 cM0) His case was discussed on tumor board.  Although he has mild asymmetric FDG activity iin the right hilum with SUV max of 2.9. This corresponds to a densely calcified hilar lymph node, consensus was reached that this is consistent with  old granulomatous disease. He is referred to Dr.Oaks to discuss about surgery.   #ill-defined right inferior lobe nodule in the thyroid, 1.7 cm by 1.1 x 1.1 cm. S/p biopsy which showed benign follicular nodule.  # 07/2017 finished SBRT.   INTERVAL HISTORY Marvin Williams. is a 73 y.o. male who has above history reviewed by me present for follow up of stage II B squamous lung cancer.  Status post SBRT, finished at the end of February 2019. Patient reports chronic shortness of breath, he is out of breath after moderate exertion.  He also sweats after exertion. Reports urinary hesitancy, frequency.  Denies any dysuria. Patient was admitted from 10/23/2018- 10/25/2018 due to E. coli sepsis/UTI.   Andhe was also provided with Flomax for likely BPH symptoms. Today after going over his medication list, patient reports he is not taking Flomax as he ran out of it. He is not so not taking Plavix because it causes easy bruising.   . Review of Systems  Constitutional: Negative for chills, fever, malaise/fatigue and weight loss.  HENT: Negative for sore throat.   Eyes: Negative for redness.  Respiratory: Positive for cough and shortness of breath. Negative for wheezing.   Cardiovascular: Negative for chest pain,  palpitations and leg swelling.  Gastrointestinal: Negative for abdominal pain, blood in stool, nausea and vomiting.  Genitourinary: Negative for dysuria.  Musculoskeletal: Negative for myalgias.  Skin: Negative for rash.  Neurological: Negative for dizziness, tingling and tremors.  Endo/Heme/Allergies: Does not bruise/bleed easily.  Psychiatric/Behavioral: Negative for hallucinations.    MEDICAL HISTORY:  Past Medical History:  Diagnosis Date  . Cancer ([place])    skin  . HTN (hypertension)   . Hyperlipemia   . Lung cancer ([place])   . Peripheral arterial disease ([place])     SURGICAL HISTORY: Past Surgical History:  Procedure Laterality Date  . BACK SURGERY    . BACK SURGERY     1971  . COLONOSCOPY    . LOWER EXTREMITY ANGIOGRAPHY Right 05/01/2018   Procedure: LOWER EXTREMITY ANGIOGRAPHY;  Surgeon: Algernon Huxley, MD;  Location: [facility];  Service: Cardiovascular;  Laterality: Right;  . LOWER EXTREMITY ANGIOGRAPHY Left 05/08/2018   Procedure: LOWER EXTREMITY ANGIOGRAPHY;  Surgeon: Algernon Huxley, MD;  Location: [facility];  Service: Cardiovascular;  Laterality: Left;    SOCIAL HISTORY: Social History   Socioeconomic History  . Marital status: Married    Spouse name: Not on file  . Number of children: Not on file  . Years of education: Not on file  . Highest education level: Not on file  Occupational History  . Not on file  Social Needs  . Financial resource strain: Not on file  . Food insecurity    Worry: Not on file    Inability: Not  on file  . Transportation needs    Medical: Not on file    Non-medical: Not on file  Tobacco Use  . Smoking status: Former Research scientist (life sciences)  . Smokeless tobacco: Never Used  . Tobacco comment: Last cigarette on 06/04/17  Substance and Sexual Activity  . Alcohol use: No    Frequency: Never  . Drug use: No  . Sexual activity: Not on file  Lifestyle  . Physical activity    Days per week: Not on file    Minutes per session: Not  on file  . Stress: Not on file  Relationships  . Social Herbalist on phone: Not on file    Gets together: Not on file    Attends religious service: Not on file    Active member of club or organization: Not on file    Attends meetings of clubs or organizations: Not on file    Relationship status: Not on file  . Intimate partner violence    Fear of current or ex partner: Not on file    Emotionally abused: Not on file    Physically abused: Not on file    Forced sexual activity: Not on file  Other Topics Concern  . Not on file  Social History Narrative  . Not on file    FAMILY HISTORY: Family History  Problem Relation Age of Onset  . Cancer Mother   . Cancer Father   . Cancer Sister     ALLERGIES:  is allergic to naproxen.  MEDICATIONS:  Current Outpatient Medications  Medication Sig Dispense Refill  . albuterol (VENTOLIN HFA) 108 (90 Base) MCG/ACT inhaler Inhale 2 puffs into the lungs every 6 (six) hours as needed for wheezing or shortness of breath. 1 Inhaler 2  . thiamine (VITAMIN B-1) 100 MG tablet Take 100 mg by mouth daily.    [name] atorvastatin (LIPITOR) 10 MG tablet Take 1 tablet (10 mg total) by mouth daily. (Patient not taking: Reported on 01/10/2019) 30 tablet 11  . clopidogrel (PLAVIX) 75 MG tablet Take 1 tablet (75 mg total) by mouth daily. (Patient not taking: Reported on 01/10/2019) 30 tablet 11  . losartan-hydrochlorothiazide (HYZAAR) 100-12.5 MG tablet Take 1 tablet by mouth daily.    . tamsulosin (FLOMAX) 0.4 MG CAPS capsule Take 1 capsule (0.4 mg total) by mouth daily. 30 capsule 1   No current facility-administered medications for this visit.      PHYSICAL EXAMINATION: ECOG PERFORMANCE STATUS: 1 - Symptomatic but completely ambulatory Vitals:   01/10/19 0955  BP: 122/70  Pulse: 70  Resp: 18  Temp: (!) 95.3 F (35.2 C)  SpO2: 93%   Filed Weights   01/10/19 0955  Weight: 173 lb 4.8 oz (78.6 kg)    Physical Exam  Constitutional: He is  oriented to person, place, and time. No distress.  HENT:  Head: Normocephalic and atraumatic.  Nose: Nose normal.  Mouth/Throat: Oropharynx is clear and moist. No oropharyngeal exudate.  Eyes: Pupils are equal, round, and reactive to light. Conjunctivae and EOM are normal. Left eye exhibits no discharge. No scleral icterus.  Neck: Normal range of motion. Neck supple. No JVD present.  Cardiovascular: Normal rate, regular rhythm and normal heart sounds. Exam reveals no friction rub.  No murmur heard. Pulmonary/Chest: Effort normal. No respiratory distress. He has no wheezes. He has no rales. He exhibits no tenderness.  Decreased breath sounds bilaterally.  Abdominal: Soft. Bowel sounds are normal. He exhibits no distension and no mass. There  is no abdominal tenderness. There is no rebound.  Musculoskeletal: Normal range of motion.        General: No tenderness or edema.  Lymphadenopathy:    He has no cervical adenopathy.  Neurological: He is alert and oriented to person, place, and time. No cranial nerve deficit. He exhibits normal muscle tone. Coordination normal.  Skin: Skin is warm and dry. No rash noted. He is not diaphoretic. No erythema.  Psychiatric: Affect and judgment normal.     LABORATORY DATA:  I have reviewed the data as listed Lab Results  Component Value Date   WBC 10.9 (H) 01/10/2019   HGB 12.7 (L) 01/10/2019   HCT 38.7 (L) 01/10/2019   MCV 89.4 01/10/2019   PLT 352 01/10/2019   Recent Labs    10/06/18 1008 10/23/18 0529 10/25/18 0557 01/10/19 0929  NA 137 134*  --  137  K 4.4 3.5  --  5.3*  CL 105 100  --  104  CO2 25 21*  --  25  GLUCOSE 100* 187*  --  109*  BUN 16 21  --  24*  CREATININE 1.36* 1.42* 1.18 1.57*  CALCIUM 9.2 9.0  --  9.4  GFRNONAA 51* 48* >60 43*  GFRAA 59* 56* >60 50*  PROT 7.6 7.4  --  7.5  ALBUMIN 4.2 3.8  --  3.7  AST 21 16  --  15  ALT 25 20  --  16  ALKPHOS 72 56  --  58  BILITOT 0.5 0.9  --  0.6  BILIDIR  --  0.2  --   --    IBILI  --  0.7  --   --    RADIOGRAPHIC STUDIES: I have personally reviewed the radiological images as listed and agreed with the findings in the report. 07/06/2018 CT chest with contrast Showed continued reduction in right lower lobe lung mass.  Continue evaluation of surrounding radiation changes.  No evidence of metastatic disease in the chest.  ASSESSMENT & PLAN:  Cancer Staging Squamous carcinoma of lung, right ([place]) Staging form: Lung, AJCC 8th Edition - Clinical stage from 06/16/2017: Stage IIB (cT3, cN0, cM0) - Signed by Earlie Server, MD on 06/16/2017  1. Squamous carcinoma of lung, right ([place])   2. Claudication ([place])   3. Benign prostatic hyperplasia with urinary hesitancy   4. Pulmonary emphysema, unspecified emphysema type ([place])   5. Elevated serum creatinine   6. Hyperkalemia    # Stage IIB lung squamous cancer s/p RT, finished at the end of February 2019. CT scan was independently reviewed and discussed with patient. Stable disease.  No evidence of metastatic disease or recurrence  #Pulmonary emphysema/chronic shortness of breath, subpleural reticulation/groundglass/bronchiectasis recommend patient to continue follow-up with Dr. Raul Del pulmonology and primary care physician.  #Claudication secondary to peripheral vascular disease.  Patient supposed to take aspirin and Plavix.  Noncompliant.  Discussed with patient.  #BPH symptoms, previously taken Flomax which helps the symptoms.  I refilled 1 month supply of Flomax.  Advised patient to Contact primary care physician for further evaluation, review of Flomax and consideration of urology referral if needed.  #Elevated creatinine/hyper kalemia potassium level 5.3 Patient is on Hyzaar which could possibly due to hyper kalemia. Patient is not sure if he is taking this medication or not I strongly recommend patient to contact primary care provider, had a repeat potassium level done at primary care provider's office in the next 1 to  2 weeks.  He voices understanding.  The patient knows to call the clinic with any problems questions or concerns. Orders Placed This Encounter  Procedures  . CT Chest Wo Contrast    Standing Status:   Future    Standing Expiration Date:   01/10/2020    Order Specific Question:   ** REASON FOR EXAM (FREE TEXT)    Answer:   lung cancer    Order Specific Question:   Preferred imaging location?    Answer:   [facility]    Order Specific Question:   Radiology Contrast Protocol - do NOT remove file path    Answer:   [email or web address]  . CBC with Differential/Platelet    Standing Status:   Future    Standing Expiration Date:   01/10/2020  . Comprehensive metabolic panel    Standing Status:   Future    Standing Expiration Date:   01/10/2020   CC Dr. Baldemar Lenis Return of visit: 3 months for reevaluation and discuss CT scanning   Earlie Server, MD, PhD Hematology Oncology [facility] at [facility] Pager- [phone] 01/10/2019

## 2019-01-10 NOTE — Progress Notes (Signed)
Here to discuss CT results from 01/08/19.  Patient reports that he is having a cough just like he had before lung cancer diagnosis.  Also states his SOBr on exertion is worsening that also comes with him sweating.  His last episode of sweating and SOBr was 2 days ago when he was working on his tractor.  He has stopped taking all his medications except for the PRN Ventolin.

## 2019-01-10 NOTE — Telephone Encounter (Signed)
Patient called stating that he has stopped taking his Atorvastatin and Plavix x 4 days due to bruising and due to a dental procedure tomorrow. His question is..is this okay. Please advise. AS, CMA

## 2019-01-12 ENCOUNTER — Other Ambulatory Visit: Payer: Self-pay

## 2019-01-15 ENCOUNTER — Other Ambulatory Visit: Payer: Self-pay

## 2019-01-15 ENCOUNTER — Encounter: Payer: Self-pay | Admitting: Radiation Oncology

## 2019-01-15 ENCOUNTER — Ambulatory Visit
Admission: RE | Admit: 2019-01-15 | Discharge: 2019-01-15 | Disposition: A | Discharge status: Home / Self Care | Payer: Medicare Other | Source: Ambulatory Visit | Attending: Radiation Oncology | Admitting: Radiation Oncology

## 2019-01-15 DIAGNOSIS — Z85118 Personal history of other malignant neoplasm of bronchus and lung: Secondary | ICD-10-CM | POA: Diagnosis not present

## 2019-01-15 DIAGNOSIS — C3491 Malignant neoplasm of unspecified part of right bronchus or lung: Secondary | ICD-10-CM

## 2019-01-15 DIAGNOSIS — Z923 Personal history of irradiation: Secondary | ICD-10-CM | POA: Insufficient documentation

## 2019-01-15 DIAGNOSIS — F17210 Nicotine dependence, cigarettes, uncomplicated: Secondary | ICD-10-CM | POA: Insufficient documentation

## 2019-01-15 NOTE — Progress Notes (Signed)
Radiation Oncology Follow up Note  Name: Marvin Williams.   Date:   01/15/2019 MRN:  [id number] DOB: Sep 13, 1945    This 73 y.o. male presents to the clinic today for 47-month follow-up status post concurrent chemoradiation therapy for stage IIb squamous cell carcinoma the right lower lobe.  REFERRING PROVIDER: Derinda Late, MD  HPI: Patient is a 73 year old male now about 14 months having completed radiation therapy to his right lower lobe for stage IIb (T3 N0 M0) squamous cell carcinoma seen today in routine follow-up he is doing well.  Does have some breathing issues although is on inhalers and does see pulmonology.  He specifically denies cough hemoptysis or chest tightness.  He recently had a CT scan.  Showing posttreatment changes in the right lower lobe no evidence of progressive or metastatic disease.  COMPLICATIONS OF TREATMENT: none  FOLLOW UP COMPLIANCE: keeps appointments   PHYSICAL EXAM:  BP (P) 140/76 (BP Location: Left Arm, Patient Position: Sitting)   Pulse (P) 88   Temp (!) (P) 97.2 F (36.2 C) (Tympanic)   Resp (P) 16   Wt (P) 173 lb 3.2 oz (78.6 kg)   BMI (P) 25.58 kg/m  Well-developed well-nourished patient in NAD. HEENT reveals PERLA, EOMI, discs not visualized.  Oral cavity is clear. No oral mucosal lesions are identified. Neck is clear without evidence of cervical or supraclavicular adenopathy. Lungs are clear to A&P. Cardiac examination is essentially unremarkable with regular rate and rhythm without murmur rub or thrill. Abdomen is benign with no organomegaly or masses noted. Motor sensory and DTR levels are equal and symmetric in the upper and lower extremities. Cranial nerves II through XII are grossly intact. Proprioception is intact. No peripheral adenopathy or edema is identified. No motor or sensory levels are noted. Crude visual fields are within normal range.  RADIOLOGY RESULTS: CT scans reviewed compatible with above-stated findings  PLAN: Present  time patient is doing well excellent response by CT criteria I am pleased with his overall progress.  I have asked to see him back in 1 year for follow-up.  Patient continues follow-up care with medical oncology.  Patient knows to call with any concerns.  I would like to take this opportunity to thank you for allowing me to participate in the care of your patient.Noreene Filbert, MD

## 2019-01-29 ENCOUNTER — Other Ambulatory Visit: Payer: Self-pay | Admitting: Oncology

## 2019-03-02 ENCOUNTER — Other Ambulatory Visit: Payer: Self-pay

## 2019-03-02 ENCOUNTER — Encounter (INDEPENDENT_AMBULATORY_CARE_PROVIDER_SITE_OTHER): Payer: Self-pay | Admitting: Vascular Surgery

## 2019-03-02 ENCOUNTER — Ambulatory Visit (INDEPENDENT_AMBULATORY_CARE_PROVIDER_SITE_OTHER): Payer: Medicare Other

## 2019-03-02 ENCOUNTER — Ambulatory Visit (INDEPENDENT_AMBULATORY_CARE_PROVIDER_SITE_OTHER): Payer: Medicare Other | Admitting: Vascular Surgery

## 2019-03-02 VITALS — BP 147/89 | HR 77 | Resp 20 | Ht 68.0 in | Wt 174.0 lb

## 2019-03-02 DIAGNOSIS — C3491 Malignant neoplasm of unspecified part of right bronchus or lung: Secondary | ICD-10-CM | POA: Diagnosis not present

## 2019-03-02 DIAGNOSIS — I70213 Atherosclerosis of native arteries of extremities with intermittent claudication, bilateral legs: Secondary | ICD-10-CM

## 2019-03-02 DIAGNOSIS — I10 Essential (primary) hypertension: Secondary | ICD-10-CM

## 2019-03-02 NOTE — Assessment & Plan Note (Signed)
blood pressure control important in reducing the progression of atherosclerotic disease. On appropriate oral medications.  

## 2019-03-02 NOTE — Assessment & Plan Note (Signed)
His noninvasive studies show relatively preserved flow with normal right ABI of 1.01 and normal digital pressures and waveforms.  On the left, he has a nearly normal digital pressure of 95 with biphasic waveforms and a stable ABIs in the 0.73 range. His perfusion at this time is fairly good and no intervention is required.  Because of his excessive bruising and the fact that we are far enough out from his procedure, he is going to come off of the Plavix and switch to aspirin therapy daily.  I will see him back in 6 months with noninvasive studies.

## 2019-03-02 NOTE — Progress Notes (Signed)
MRN : [id number]  Marvin Williams. is a 73 y.o. ([birth year]) male who presents with chief complaint of  Chief Complaint  Patient presents with  . Follow-up    6 month follow up   .  History of Present Illness: Patient returns today in follow up of his PAD.  He has a lot of nonspecific complaints today.  He says after he got his cancer diagnosis last year he has just fallen apart.  His legs are not hurting like they were before his right leg intervention last year.  His noninvasive studies show relatively preserved flow with normal right ABI of 1.01 and normal digital pressures and waveforms.  On the left, he has a nearly normal digital pressure of 95 with biphasic waveforms and a stable ABIs in the 0.73 range.  No rest pain.  No lower extremity ulceration.  He does complain of excessive bruising on the Plavix.  Current Outpatient Medications  Medication Sig Dispense Refill  . albuterol (VENTOLIN HFA) 108 (90 Base) MCG/ACT inhaler Inhale 2 puffs into the lungs every 6 (six) hours as needed for wheezing or shortness of breath. 1 Inhaler 2  . atorvastatin (LIPITOR) 10 MG tablet Take 1 tablet (10 mg total) by mouth daily. 30 tablet 11  . budesonide-formoterol (SYMBICORT) 160-4.5 MCG/ACT inhaler Inhale 2 puffs into the lungs 2 (two) times daily.    . clopidogrel (PLAVIX) 75 MG tablet Take 1 tablet (75 mg total) by mouth daily. 30 tablet 11  . losartan-hydrochlorothiazide (HYZAAR) 100-12.5 MG tablet Take 1 tablet by mouth daily.    . tamsulosin (FLOMAX) 0.4 MG CAPS capsule Take 1 capsule (0.4 mg total) by mouth daily. 30 capsule 1  . thiamine (VITAMIN B-1) 100 MG tablet Take 100 mg by mouth daily.     No current facility-administered medications for this visit.     Past Medical History:  Diagnosis Date  . Cancer ([place])    skin  . HTN (hypertension)   . Hyperlipemia   . Lung cancer ([place])   . Peripheral arterial disease [facility])     Past Surgical History:  Procedure Laterality Date  .  BACK SURGERY    . BACK SURGERY     1971  . COLONOSCOPY    . LOWER EXTREMITY ANGIOGRAPHY Right 05/01/2018   Procedure: LOWER EXTREMITY ANGIOGRAPHY;  Surgeon: Algernon Huxley, MD;  Location: [facility];  Service: Cardiovascular;  Laterality: Right;  . LOWER EXTREMITY ANGIOGRAPHY Left 05/08/2018   Procedure: LOWER EXTREMITY ANGIOGRAPHY;  Surgeon: Algernon Huxley, MD;  Location: [facility];  Service: Cardiovascular;  Laterality: Left;    Social History Social History   Tobacco Use  . Smoking status: Former Research scientist (life sciences)  . Smokeless tobacco: Never Used  . Tobacco comment: Last cigarette on 06/04/17  Substance Use Topics  . Alcohol use: No    Frequency: Never  . Drug use: No      Family History Family History  Problem Relation Age of Onset  . Cancer Mother   . Cancer Father   . Cancer Sister      Allergies  Allergen Reactions  . Naproxen Other (See Comments)    Per patient: oncologist prefers he does NOT take     REVIEW OF SYSTEMS(Negative unless checked)  Constitutional: [] ?Weight loss[] ?Fever[] ?Chills Cardiac:[] ?Chest pain[] ?Chest pressure[] ?Palpitations [] ?Shortness of breath when laying flat [] ?Shortness of breath at rest [] ?Shortness of breath with exertion. Vascular: [x] ?Pain in legs with walking[] ?Pain in legsat rest[] ?Pain in legs when laying  flat [x] ?Claudication [] ?Pain in feet when walking [] ?Pain in feet at rest [] ?Pain in feet when laying flat [] ?History of DVT [] ?Phlebitis [] ?Swelling in legs [] ?Varicose veins [] ?Non-healing ulcers Pulmonary: [] ?Uses home oxygen [] ?Productive cough[] ?Hemoptysis [] ?Wheeze [] ?COPD [] ?Asthma Neurologic: [] ?Dizziness [] ?Blackouts [] ?Seizures [] ?History of stroke [] ?History of TIA[] ?Aphasia [] ?Temporary blindness[] ?Dysphagia [] ?Weaknessor numbness in arms [] ?Weakness or numbnessin legs Musculoskeletal: [x] ?Arthritis [] ?Joint swelling [x] ?Joint pain  [] ?Low back pain Hematologic:[x] ?Easy bruising[] ?Easy bleeding [] ?Hypercoagulable state [] ?Anemic [] ?Hepatitis Gastrointestinal:[] ?Blood in stool[] ?Vomiting blood[] ?Gastroesophageal reflux/heartburn[] ?Abdominal pain Genitourinary: [] ?Chronic kidney disease [] ?Difficulturination [] ?Frequenturination [] ?Burning with urination[] ?Hematuria Skin: [] ?Rashes [] ?Ulcers [] ?Wounds Psychological: [] ?History of anxiety[] ?History of major depression.  Physical Examination  BP (!) 147/89 (BP Location: Right Arm)   Pulse 77   Resp 20   Ht 5\' 8"  (1.727 m)   Wt 174 lb (78.9 kg)   BMI 26.46 kg/m  Gen:  WD/WN, NAD Head: [place]/AT, No temporalis wasting. Ear/Nose/Throat: Hearing grossly intact, nares w/o erythema or drainage Eyes: Conjunctiva clear. Sclera non-icteric Neck: Supple.  Trachea midline Pulmonary:  Good air movement, no use of accessory muscles.  Cardiac: RRR, no JVD Vascular:  Vessel Right Left  Radial Palpable Palpable                          PT Palpable  1+ palpable  DP  1+ palpable  1+ palpable    Musculoskeletal: M/S 5/5 throughout.  No deformity or atrophy.  No edema. Neurologic: Sensation grossly intact in extremities.  Symmetrical.  Speech is fluent.  Psychiatric: Judgment intact, Mood & affect appropriate for pt's clinical situation. Dermatologic: No rashes or ulcers noted.  No cellulitis or open wounds.  Scattered superficial bruising is present       Labs Recent Results (from the past 2160 hour(s))  Comprehensive metabolic panel     Status: Abnormal   Collection Time: 01/10/19  9:29 AM  Result Value Ref Range   Sodium 137 135 - 145 mmol/L   Potassium 5.3 (H) 3.5 - 5.1 mmol/L   Chloride 104 98 - 111 mmol/L   CO2 25 22 - 32 mmol/L   Glucose, Bld 109 (H) 70 - 99 mg/dL   BUN 24 (H) 8 - 23 mg/dL   Creatinine, Ser 1.57 (H) 0.61 - 1.24 mg/dL   Calcium 9.4 8.9 - 10.3 mg/dL   Total Protein 7.5 6.5 - 8.1 g/dL   Albumin 3.7 3.5  - 5.0 g/dL   AST 15 15 - 41 U/L   ALT 16 0 - 44 U/L   Alkaline Phosphatase 58 38 - 126 U/L   Total Bilirubin 0.6 0.3 - 1.2 mg/dL   GFR calc non Af Amer 43 (L) >60 mL/min   GFR calc Af Amer 50 (L) >60 mL/min   Anion gap 8 5 - 15    Comment: Performed at [facility], [address] [place], [place] [address]  CBC with Differential/Platelet     Status: Abnormal   Collection Time: 01/10/19  9:29 AM  Result Value Ref Range   WBC 10.9 (H) 4.0 - 10.5 K/uL   RBC 4.33 4.22 - 5.81 MIL/uL   Hemoglobin 12.7 (L) 13.0 - 17.0 g/dL   HCT 38.7 (L) 39.0 - 52.0 %   MCV 89.4 80.0 - 100.0 fL   MCH 29.3 26.0 - 34.0 pg   MCHC 32.8 30.0 - 36.0 g/dL   RDW 15.2 11.5 - 15.5 %   Platelets 352 150 - 400 K/uL   nRBC 0.0 0.0 - 0.2 %   Neutrophils  Relative % 73 %   Neutro Abs 8.0 (H) 1.7 - 7.7 K/uL   Lymphocytes Relative 13 %   Lymphs Abs 1.5 0.7 - 4.0 K/uL   Monocytes Relative 10 %   Monocytes Absolute 1.0 0.1 - 1.0 K/uL   Eosinophils Relative 2 %   Eosinophils Absolute 0.3 0.0 - 0.5 K/uL   Basophils Relative 1 %   Basophils Absolute 0.1 0.0 - 0.1 K/uL   Immature Granulocytes 1 %   Abs Immature Granulocytes 0.15 (H) 0.00 - 0.07 K/uL    Comment: Performed at [facility], [address], [place], [place] [address]    Radiology No results found.  Assessment/Plan Squamous carcinoma of lung, right (H[facility]) Apparently doing well from the treatment of this  Essential hypertension blood pressure control important in reducing the progression of atherosclerotic disease. On appropriate oral medications.   Atherosclerosis of native arteries of extremity with intermittent claudication ([facility]) His noninvasive studies show relatively preserved flow with normal right ABI of 1.01 and normal digital pressures and waveforms.  On the left, he has a nearly normal digital pressure of 95 with biphasic waveforms and a stable ABIs in the 0.73 range. His perfusion at this time is fairly good and no intervention  is required.  Because of his excessive bruising and the fact that we are far enough out from his procedure, he is going to come off of the Plavix and switch to aspirin therapy daily.  I will see him back in 6 months with noninvasive studies.    Leotis Pain, MD  03/02/2019 10:37 AM    This note was created with Dragon medical transcription system.  Any errors from dictation are purely unintentional

## 2019-03-20 ENCOUNTER — Other Ambulatory Visit: Payer: Self-pay | Admitting: *Deleted

## 2019-03-20 MED ORDER — TAMSULOSIN HCL 0.4 MG PO CAPS: 0.4000 mg | ORAL_CAPSULE | Freq: Every day | ORAL | 0 refills | Status: DC

## 2019-03-21 ENCOUNTER — Other Ambulatory Visit: Payer: Self-pay | Admitting: *Deleted

## 2019-03-21 NOTE — Telephone Encounter (Signed)
Pharmacy requesting 90 day supply

## 2019-03-27 MED ORDER — TAMSULOSIN HCL 0.4 MG PO CAPS: 0.4000 mg | ORAL_CAPSULE | Freq: Every day | ORAL | 1 refills | Status: AC

## 2019-04-09 ENCOUNTER — Ambulatory Visit
Admission: RE | Admit: 2019-04-09 | Discharge: 2019-04-09 | Disposition: A | Discharge status: Home / Self Care | Payer: Medicare Other | Source: Ambulatory Visit | Attending: Oncology | Admitting: Oncology

## 2019-04-09 ENCOUNTER — Other Ambulatory Visit: Payer: Self-pay

## 2019-04-09 DIAGNOSIS — C3491 Malignant neoplasm of unspecified part of right bronchus or lung: Secondary | ICD-10-CM

## 2019-04-10 ENCOUNTER — Other Ambulatory Visit: Payer: Self-pay

## 2019-04-10 NOTE — Progress Notes (Signed)
Patient pre screened for office appointment, no questions or concerns today. Patient reminded of upcoming appointment time and date. 

## 2019-04-11 ENCOUNTER — Encounter: Payer: Self-pay | Admitting: Oncology

## 2019-04-11 ENCOUNTER — Inpatient Hospital Stay (HOSPITAL_BASED_OUTPATIENT_CLINIC_OR_DEPARTMENT_OTHER): Payer: Medicare Other | Admitting: Oncology

## 2019-04-11 ENCOUNTER — Inpatient Hospital Stay: Payer: Medicare Other | Attending: Oncology

## 2019-04-11 ENCOUNTER — Other Ambulatory Visit: Payer: Self-pay

## 2019-04-11 VITALS — BP 129/81 | HR 69 | Temp 97.8°F | Resp 18 | Wt 178.5 lb

## 2019-04-11 DIAGNOSIS — Z85118 Personal history of other malignant neoplasm of bronchus and lung: Secondary | ICD-10-CM | POA: Insufficient documentation

## 2019-04-11 DIAGNOSIS — N1831 Chronic kidney disease, stage 3a: Secondary | ICD-10-CM | POA: Diagnosis not present

## 2019-04-11 DIAGNOSIS — I10 Essential (primary) hypertension: Secondary | ICD-10-CM | POA: Insufficient documentation

## 2019-04-11 DIAGNOSIS — Z85828 Personal history of other malignant neoplasm of skin: Secondary | ICD-10-CM | POA: Insufficient documentation

## 2019-04-11 DIAGNOSIS — Z87891 Personal history of nicotine dependence: Secondary | ICD-10-CM | POA: Diagnosis not present

## 2019-04-11 DIAGNOSIS — I2510 Atherosclerotic heart disease of native coronary artery without angina pectoris: Secondary | ICD-10-CM | POA: Diagnosis not present

## 2019-04-11 DIAGNOSIS — I700 Atherosclerosis of aorta: Secondary | ICD-10-CM | POA: Diagnosis not present

## 2019-04-11 DIAGNOSIS — Z7982 Long term (current) use of aspirin: Secondary | ICD-10-CM | POA: Insufficient documentation

## 2019-04-11 DIAGNOSIS — J439 Emphysema, unspecified: Secondary | ICD-10-CM | POA: Insufficient documentation

## 2019-04-11 DIAGNOSIS — C3491 Malignant neoplasm of unspecified part of right bronchus or lung: Secondary | ICD-10-CM

## 2019-04-11 DIAGNOSIS — E041 Nontoxic single thyroid nodule: Secondary | ICD-10-CM | POA: Diagnosis not present

## 2019-04-11 DIAGNOSIS — E785 Hyperlipidemia, unspecified: Secondary | ICD-10-CM | POA: Diagnosis not present

## 2019-04-11 DIAGNOSIS — Z79899 Other long term (current) drug therapy: Secondary | ICD-10-CM | POA: Diagnosis not present

## 2019-04-11 DIAGNOSIS — I739 Peripheral vascular disease, unspecified: Secondary | ICD-10-CM | POA: Insufficient documentation

## 2019-04-11 DIAGNOSIS — N183 Chronic kidney disease, stage 3 unspecified: Secondary | ICD-10-CM | POA: Diagnosis not present

## 2019-04-11 LAB — COMPREHENSIVE METABOLIC PANEL
ALT: 27 U/L (ref 0–44)
AST: 22 U/L (ref 15–41)
Albumin: 3.9 g/dL (ref 3.5–5.0)
Alkaline Phosphatase: 64 U/L (ref 38–126)
Anion gap: 6 (ref 5–15)
BUN: 23 mg/dL (ref 8–23)
CO2: 23 mmol/L (ref 22–32)
Calcium: 9.1 mg/dL (ref 8.9–10.3)
Chloride: 107 mmol/L (ref 98–111)
Creatinine, Ser: 1.42 mg/dL — ABNORMAL HIGH (ref 0.61–1.24)
GFR calc Af Amer: 56 mL/min — ABNORMAL LOW (ref >60)
GFR calc non Af Amer: 48 mL/min — ABNORMAL LOW (ref >60)
Glucose, Bld: 116 mg/dL — ABNORMAL HIGH (ref 70–99)
Potassium: 4.1 mmol/L (ref 3.5–5.1)
Sodium: 136 mmol/L (ref 135–145)
Total Bilirubin: 0.5 mg/dL (ref 0.3–1.2)
Total Protein: 7.3 g/dL (ref 6.5–8.1)

## 2019-04-11 LAB — CBC WITH DIFFERENTIAL/PLATELET
Abs Immature Granulocytes: 0.04 10*3/uL (ref 0.00–0.07)
Basophils Absolute: 0 10*3/uL (ref 0.0–0.1)
Basophils Relative: 0 %
Eosinophils Absolute: 0.2 10*3/uL (ref 0.0–0.5)
Eosinophils Relative: 2 %
HCT: 41 % (ref 39.0–52.0)
Hemoglobin: 13.5 g/dL (ref 13.0–17.0)
Immature Granulocytes: 0 %
Lymphocytes Relative: 15 %
Lymphs Abs: 1.5 10*3/uL (ref 0.7–4.0)
MCH: 29.5 pg (ref 26.0–34.0)
MCHC: 32.9 g/dL (ref 30.0–36.0)
MCV: 89.7 fL (ref 80.0–100.0)
Monocytes Absolute: 1 10*3/uL (ref 0.1–1.0)
Monocytes Relative: 10 %
Neutro Abs: 7 10*3/uL (ref 1.7–7.7)
Neutrophils Relative %: 73 %
Platelets: 298 10*3/uL (ref 150–400)
RBC: 4.57 MIL/uL (ref 4.22–5.81)
RDW: 13.1 % (ref 11.5–15.5)
WBC: 9.8 10*3/uL (ref 4.0–10.5)
nRBC: 0 % (ref 0.0–0.2)

## 2019-04-11 NOTE — Progress Notes (Signed)
Patient reports he has been having a hard time breathing for the past 1.5 yr and does see a pulmonologist.  Pulse O2 97% today

## 2019-04-11 NOTE — Progress Notes (Signed)
Hematology/Oncology Follow up note [facility] Telephone:(336) [phone] Fax:(336) [phone]   Patient Care Team: Derinda Late, MD as PCP - General (Family Medicine) Telford Nab, RN as Registered Nurse  . REASON FOR VISIT Follow up for treatment of squamous lung cancer, thyroid nodule HISTORY OF PRESENTING ILLNESS:  Marvin Williams. is a  73 y.o.  male follows up for management of squamous lung cancer, stage IIB (cT3,cN0 cM0) His case was discussed on tumor board.  Although he has mild asymmetric FDG activity iin the right hilum with SUV max of 2.9. This corresponds to a densely calcified hilar lymph node, consensus was reached that this is consistent with  old granulomatous disease. He is referred to Dr.Oaks to discuss about surgery.   #ill-defined right inferior lobe nodule in the thyroid, 1.7 cm by 1.1 x 1.1 cm. S/p biopsy which showed benign follicular nodule.  # 07/2017 finished SBRT.  admitted from 10/23/2018- 10/25/2018 due to E. coli sepsis/UTI.     INTERVAL HISTORY Marvin Williams. is a 73 y.o. male who has above history reviewed by me present for follow up of stage II B squamous lung cancer.  Patient has done interval CT chest. Presents for follow.  Reports feeling well, except that he continues to feel shortness of breath, exacerbated with exertion.   # recently followed up with vascular surgeon of peripheral vascular disease. On Aspirin. Off plavix due to easy bruising.   . Review of Systems  Constitutional: Negative for chills, fever, malaise/fatigue and weight loss.  HENT: Negative for sore throat.   Eyes: Negative for redness.  Respiratory: Positive for cough and shortness of breath. Negative for wheezing.   Cardiovascular: Negative for chest pain, palpitations and leg swelling.  Gastrointestinal: Negative for abdominal pain, blood in stool, nausea and vomiting.  Genitourinary: Negative for dysuria.  Musculoskeletal: Negative for myalgias.   Skin: Negative for rash.  Neurological: Negative for dizziness, tingling and tremors.  Endo/Heme/Allergies: Does not bruise/bleed easily.  Psychiatric/Behavioral: Negative for hallucinations.    MEDICAL HISTORY:  Past Medical History:  Diagnosis Date   Cancer ([place])    skin   HTN (hypertension)    Hyperlipemia    Lung cancer ([place])    Peripheral arterial disease ([place])     SURGICAL HISTORY: Past Surgical History:  Procedure Laterality Date   BACK SURGERY     BACK SURGERY     1971   COLONOSCOPY     LOWER EXTREMITY ANGIOGRAPHY Right 05/01/2018   Procedure: LOWER EXTREMITY ANGIOGRAPHY;  Surgeon: Algernon Huxley, MD;  Location: [facility];  Service: Cardiovascular;  Laterality: Right;   LOWER EXTREMITY ANGIOGRAPHY Left 05/08/2018   Procedure: LOWER EXTREMITY ANGIOGRAPHY;  Surgeon: Algernon Huxley, MD;  Location: [facility];  Service: Cardiovascular;  Laterality: Left;    SOCIAL HISTORY: Social History   Socioeconomic History   Marital status: Married    Spouse name: Not on file   Number of children: Not on file   Years of education: Not on file   Highest education level: Not on file  Occupational History   Not on file  Social Needs   Financial resource strain: Not on file   Food insecurity    Worry: Not on file    Inability: Not on file   Transportation needs    Medical: Not on file    Non-medical: Not on file  Tobacco Use   Smoking status: Former Smoker   Smokeless tobacco: Never Used  Tobacco comment: Last cigarette on 06/04/17  Substance and Sexual Activity   Alcohol use: No    Frequency: Never   Drug use: No   Sexual activity: Not on file  Lifestyle   Physical activity    Days per week: Not on file    Minutes per session: Not on file   Stress: Not on file  Relationships   Social connections    Talks on phone: Not on file    Gets together: Not on file    Attends religious service: Not on file    Active member of  club or organization: Not on file    Attends meetings of clubs or organizations: Not on file    Relationship status: Not on file   Intimate partner violence    Fear of current or ex partner: Not on file    Emotionally abused: Not on file    Physically abused: Not on file    Forced sexual activity: Not on file  Other Topics Concern   Not on file  Social History Narrative   Not on file    FAMILY HISTORY: Family History  Problem Relation Age of Onset   Cancer Mother    Cancer Father    Cancer Sister     ALLERGIES:  is allergic to naproxen.  MEDICATIONS:  Current Outpatient Medications  Medication Sig Dispense Refill   aspirin 81 MG chewable tablet Chew by mouth daily.     atorvastatin (LIPITOR) 10 MG tablet Take 1 tablet (10 mg total) by mouth daily. 30 tablet 11   Cholecalciferol (VITAMIN D) 125 MCG (5000 UT) CAPS Take 1 capsule by mouth daily.     losartan-hydrochlorothiazide (HYZAAR) 100-12.5 MG tablet Take 1 tablet by mouth daily.     tamsulosin (FLOMAX) 0.4 MG CAPS capsule Take 1 capsule (0.4 mg total) by mouth daily. 90 capsule 1   thiamine (VITAMIN B-1) 100 MG tablet Take 100 mg by mouth daily.     zinc gluconate 50 MG tablet Take 50 mg by mouth daily.     No current facility-administered medications for this visit.      PHYSICAL EXAMINATION: ECOG PERFORMANCE STATUS: 1 - Symptomatic but completely ambulatory Vitals:   04/11/19 0939  BP: 129/81  Pulse: 69  Resp: 18  Temp: 97.8 F (36.6 C)  SpO2: 97%   Filed Weights   04/11/19 0939  Weight: 178 lb 8 oz (81 kg)    Physical Exam  Constitutional: He is oriented to person, place, and time. No distress.  HENT:  Head: Normocephalic and atraumatic.  Mouth/Throat: No oropharyngeal exudate.  Eyes: Pupils are equal, round, and reactive to light. Conjunctivae and EOM are normal. Left eye exhibits no discharge. No scleral icterus.  Neck: Normal range of motion. Neck supple. No JVD present.    Cardiovascular: Normal rate, regular rhythm and normal heart sounds. Exam reveals no friction rub.  No murmur heard. Pulmonary/Chest: Effort normal. No respiratory distress. He has no wheezes. He has no rales. He exhibits no tenderness.  Decreased breath sounds bilaterally.  Abdominal: Soft. Bowel sounds are normal. He exhibits no distension and no mass. There is no abdominal tenderness. There is no rebound.  Musculoskeletal: Normal range of motion.        General: No tenderness or edema.  Lymphadenopathy:    He has no cervical adenopathy.  Neurological: He is alert and oriented to person, place, and time. No cranial nerve deficit. He exhibits normal muscle tone. Coordination normal.  Skin: Skin  is warm and dry. No rash noted. He is not diaphoretic. No erythema.  Psychiatric: Affect and judgment normal.     LABORATORY DATA:  I have reviewed the data as listed Lab Results  Component Value Date   WBC 9.8 04/11/2019   HGB 13.5 04/11/2019   HCT 41.0 04/11/2019   MCV 89.7 04/11/2019   PLT 298 04/11/2019   Recent Labs    10/23/18 0529 10/25/18 0557 01/10/19 0929 04/11/19 0904  NA 134*  --  137 136  K 3.5  --  5.3* 4.1  CL 100  --  104 107  CO2 21*  --  25 23  GLUCOSE 187*  --  109* 116*  BUN 21  --  24* 23  CREATININE 1.42* 1.18 1.57* 1.42*  CALCIUM 9.0  --  9.4 9.1  GFRNONAA 48* >60 43* 48*  GFRAA 56* >60 50* 56*  PROT 7.4  --  7.5 7.3  ALBUMIN 3.8  --  3.7 3.9  AST 16  --  15 22  ALT 20  --  16 27  ALKPHOS 56  --  58 64  BILITOT 0.9  --  0.6 0.5  BILIDIR 0.2  --   --   --   IBILI 0.7  --   --   --    RADIOGRAPHIC STUDIES: I have personally reviewed the radiological images as listed and agreed with the findings in the report. 07/06/2018 CT chest with contrast Showed continued reduction in right lower lobe lung mass.  Continue evaluation of surrounding radiation changes.  No evidence of metastatic disease in the chest. Ct Chest Wo Contrast  Result Date:  04/09/2019 CLINICAL DATA:  73 year old male with history of right-sided lung cancer. Follow-up study. Shortness of breath and right-sided chest pain. EXAM: CT CHEST WITHOUT CONTRAST TECHNIQUE: Multidetector CT imaging of the chest was performed following the standard protocol without IV contrast. COMPARISON:  Chest CT 01/08/2019. FINDINGS: Cardiovascular: Heart size is normal. There is no significant pericardial fluid, thickening or pericardial calcification. There is aortic atherosclerosis, as well as atherosclerosis of the great vessels of the mediastinum and the coronary arteries, including calcified atherosclerotic plaque in the left anterior descending and left circumflex coronary arteries. Mediastinum/Nodes: No pathologically enlarged mediastinal or hilar lymph nodes. Please note that accurate exclusion of hilar adenopathy is limited on noncontrast CT scans. Multiple densely calcified bilateral hilar and mediastinal lymph nodes incidentally noted. Esophagus is unremarkable in appearance. No axillary lymphadenopathy. Lungs/Pleura: Mild diffuse bronchial wall thickening with mild centrilobular and paraseptal emphysema. There is also a widespread peripheral predominant ground-glass attenuation, septal thickening, some subpleural reticulation, mild cylindrical bronchiectasis and peripheral bronchiolectasis, very similar to prior studies, suggesting underlying interstitial lung disease. In the right lower lobe there continues to be mass-like areas of architectural distortion most evident in the superior segment, compatible with postradiation mass-like fibrosis. No definitive findings to suggest locally recurrent disease. Calcified granulomas in the lungs bilaterally. No other definite suspicious appearing pulmonary nodules or masses are noted. No acute consolidative airspace disease. No pleural effusions. Upper Abdomen: Multiple low-attenuation lesions in both kidneys, incompletely characterized on today's  noncontrast CT examination, but similar to prior studies measuring up to at least 4 cm in the interpolar region of the left kidney. Probable extrarenal pelvis associated with the left kidney incompletely imaged. In the central aspect of segment 8 of the liver there is also a 1.8 x 1.1 cm low-attenuation lesion, incompletely characterized, but statistically likely to represent a cyst. Musculoskeletal: There are  no aggressive appearing lytic or blastic lesions noted in the visualized portions of the skeleton. IMPRESSION: 1. Stable changes of postradiation mass-like fibrosis in the right lower lobe. No definite evidence of locally recurrent or metastatic disease in the thorax. 2. Diffuse bronchial wall thickening with mild centrilobular and paraseptal emphysema; imaging findings suggestive of underlying COPD. 3. In addition, there is evidence of interstitial lung disease, with a spectrum of findings that is technically characterized as probable usual interstitial pneumonia (UIP), similar to prior examinations. This may represent early UIP, or could simply represent nonspecific interstitial pneumonia (NSIP). 4. Aortic atherosclerosis, in addition to 2 vessel coronary artery disease. Assessment for potential risk factor modification, dietary therapy or pharmacologic therapy may be warranted, if clinically indicated. Aortic Atherosclerosis (ICD10-I70.0) and Emphysema (ICD10-J43.9). Electronically Signed   By: Vinnie Langton M.D.   On: 04/09/2019 09:38   Vas [place] Burnard Bunting With/wo Tbi  Result Date: 03/06/2019 LOWER EXTREMITY DOPPLER STUDY Indications: Claudication, and peripheral artery disease.  Vascular Interventions: 05/01/2018: Right SFA/Popliteal Artery stent;                         05/08/2018: Angiogram demonstrated left SFA/popliteal                         obstruction/aberrant anatomy with large collaterals and                         a left ATA occlusion;. Comparison Study: 08/28/2018 Performing Technologist:  Almira Coaster RVS  Examination Guidelines: A complete evaluation includes at minimum, Doppler waveform signals and systolic blood pressure reading at the level of bilateral brachial, anterior tibial, and posterior tibial arteries, when vessel segments are accessible. Bilateral testing is considered an integral part of a complete examination. Photoelectric Plethysmograph (PPG) waveforms and toe systolic pressure readings are included as required and additional duplex testing as needed. Limited examinations for reoccurring indications may be performed as noted.  ABI Findings: +---------+------------------+-----+---------+--------+  Right     Rt Pressure (mmHg) Index Waveform  Comment   +---------+------------------+-----+---------+--------+  Brachial  138                                          +---------+------------------+-----+---------+--------+  ATA       117                0.85  biphasic            +---------+------------------+-----+---------+--------+  PTA       139                1.01  triphasic           +---------+------------------+-----+---------+--------+  Great Toe 125                0.91  Normal              +---------+------------------+-----+---------+--------+ +---------+------------------+-----+--------+-------+  Left      Lt Pressure (mmHg) Index Waveform Comment  +---------+------------------+-----+--------+-------+  Brachial  130                                        +---------+------------------+-----+--------+-------+  ATA       90  0.65  biphasic          +---------+------------------+-----+--------+-------+  PTA       101                0.73  biphasic          +---------+------------------+-----+--------+-------+  Great Toe 95                 0.69  Normal            +---------+------------------+-----+--------+-------+ +-------+-----------+-----------+------------+------------+  ABI/TBI Today's ABI Today's TBI Previous ABI Previous TBI   +-------+-----------+-----------+------------+------------+  Right   1.01        .91         1.03         .52           +-------+-----------+-----------+------------+------------+  Left    .73         .69         .86          .62           +-------+-----------+-----------+------------+------------+ Bilateral TBIs appear increased compared to prior study on 08/28/2018. Left ABIs appear decreased compared to prior study on 08/28/2018.  Summary: Right: Resting right ankle-brachial index is within normal range. No evidence of significant right lower extremity arterial disease. The right toe-brachial index is normal. Left: Resting left ankle-brachial index indicates moderate left lower extremity arterial disease. The left toe-brachial index is normal.  *See table(s) above for measurements and observations.  Electronically signed by Leotis Pain MD on 03/06/2019 at 4:54:10 PM.    Final     ASSESSMENT & PLAN:  Cancer Staging Squamous carcinoma of lung, right ([place]) Staging form: Lung, AJCC 8th Edition - Clinical stage from 06/16/2017: Stage IIB (cT3, cN0, cM0) - Signed by Earlie Server, MD on 06/16/2017  1. Squamous carcinoma of lung, right ([place])   2. Pulmonary emphysema, unspecified emphysema type ([place])   3. Stage 3a chronic kidney disease    # Stage IIB lung squamous cancer s/p RT, finished at the end of February 2019. CT chest was independently reviewed by me and discussed with patient.  No recurrence or metastatic disease.  Recommend continue CT every 3-4 months until 2 years post treatment, then increase interval to every 6 months.  He agrees with the plan.   # COPD emphysema, he follows up with Dr.F[facility].  Discussed with patient that his CT also showed radiographic evidence of interstitial lung disease. Encourage patient to further discuss with Dr.Fleming.   # CKD, Stage 3. Persistent elevated creatinine 1.42, recommend patient to avoid nephrotoxins.recommend pt to follow up with PCP for further discussion  and managemnet.  I will obtain multiple myeloma panel at next visit.   The patient knows to call the clinic with any problems questions or concerns. Orders Placed This Encounter  Procedures   CT Chest Wo Contrast    Standing Status:   Future    Standing Expiration Date:   04/10/2020    Order Specific Question:   ** REASON FOR EXAM (FREE TEXT)    Answer:   lung cancer follow up    Order Specific Question:   Preferred imaging location?    Answer:   [facility]    Order Specific Question:   Radiology Contrast Protocol - do NOT remove file path    Answer:   [email or web address]   CBC with Differential    Standing Status:   Future    Standing Expiration Date:  04/10/2020   Comprehensive metabolic panel    Standing Status:   Future    Standing Expiration Date:   04/10/2020   Multiple Myeloma Panel (SPEP&IFE w/QIG)    Standing Status:   Future    Standing Expiration Date:   04/10/2020   Kappa/lambda light chains    Standing Status:   Future    Standing Expiration Date:   04/10/2020   CC Dr. Baldemar Lenis Return of visit: 3 months for reevaluation and discuss CT scanning   Earlie Server, MD, PhD Hematology Oncology [facility] at [facility] Pager- [phone] 04/11/2019

## 2019-04-30 DIAGNOSIS — Z85118 Personal history of other malignant neoplasm of bronchus and lung: Secondary | ICD-10-CM | POA: Insufficient documentation

## 2019-04-30 DIAGNOSIS — J8410 Pulmonary fibrosis, unspecified: Secondary | ICD-10-CM | POA: Insufficient documentation

## 2019-06-08 ENCOUNTER — Other Ambulatory Visit (INDEPENDENT_AMBULATORY_CARE_PROVIDER_SITE_OTHER): Payer: Self-pay | Admitting: Vascular Surgery

## 2019-07-13 ENCOUNTER — Other Ambulatory Visit: Payer: Self-pay

## 2019-07-13 ENCOUNTER — Ambulatory Visit
Admission: RE | Admit: 2019-07-13 | Discharge: 2019-07-13 | Disposition: A | Discharge status: Home / Self Care | Payer: Medicare Other | Source: Ambulatory Visit | Attending: Oncology | Admitting: Oncology

## 2019-07-13 ENCOUNTER — Encounter: Payer: Self-pay | Admitting: Oncology

## 2019-07-13 DIAGNOSIS — C3491 Malignant neoplasm of unspecified part of right bronchus or lung: Secondary | ICD-10-CM

## 2019-07-17 ENCOUNTER — Other Ambulatory Visit: Payer: Self-pay

## 2019-07-17 ENCOUNTER — Encounter: Payer: Self-pay | Admitting: Oncology

## 2019-07-17 ENCOUNTER — Inpatient Hospital Stay (HOSPITAL_BASED_OUTPATIENT_CLINIC_OR_DEPARTMENT_OTHER): Payer: Medicare Other | Admitting: Oncology

## 2019-07-17 ENCOUNTER — Inpatient Hospital Stay: Payer: Medicare Other | Attending: Oncology

## 2019-07-17 VITALS — BP 118/74 | HR 84 | Temp 96.5°F | Resp 18 | Wt 181.8 lb

## 2019-07-17 DIAGNOSIS — N1831 Chronic kidney disease, stage 3a: Secondary | ICD-10-CM

## 2019-07-17 DIAGNOSIS — N183 Chronic kidney disease, stage 3 unspecified: Secondary | ICD-10-CM | POA: Diagnosis not present

## 2019-07-17 DIAGNOSIS — C3491 Malignant neoplasm of unspecified part of right bronchus or lung: Secondary | ICD-10-CM

## 2019-07-17 DIAGNOSIS — C3431 Malignant neoplasm of lower lobe, right bronchus or lung: Secondary | ICD-10-CM | POA: Diagnosis present

## 2019-07-17 DIAGNOSIS — I739 Peripheral vascular disease, unspecified: Secondary | ICD-10-CM | POA: Diagnosis not present

## 2019-07-17 DIAGNOSIS — Z87891 Personal history of nicotine dependence: Secondary | ICD-10-CM | POA: Insufficient documentation

## 2019-07-17 DIAGNOSIS — Z7982 Long term (current) use of aspirin: Secondary | ICD-10-CM | POA: Diagnosis not present

## 2019-07-17 DIAGNOSIS — Z79899 Other long term (current) drug therapy: Secondary | ICD-10-CM | POA: Insufficient documentation

## 2019-07-17 DIAGNOSIS — E041 Nontoxic single thyroid nodule: Secondary | ICD-10-CM | POA: Diagnosis not present

## 2019-07-17 DIAGNOSIS — I700 Atherosclerosis of aorta: Secondary | ICD-10-CM | POA: Insufficient documentation

## 2019-07-17 DIAGNOSIS — Z85828 Personal history of other malignant neoplasm of skin: Secondary | ICD-10-CM | POA: Insufficient documentation

## 2019-07-17 DIAGNOSIS — N281 Cyst of kidney, acquired: Secondary | ICD-10-CM | POA: Insufficient documentation

## 2019-07-17 DIAGNOSIS — I129 Hypertensive chronic kidney disease with stage 1 through stage 4 chronic kidney disease, or unspecified chronic kidney disease: Secondary | ICD-10-CM | POA: Insufficient documentation

## 2019-07-17 DIAGNOSIS — E785 Hyperlipidemia, unspecified: Secondary | ICD-10-CM | POA: Insufficient documentation

## 2019-07-17 DIAGNOSIS — J439 Emphysema, unspecified: Secondary | ICD-10-CM | POA: Diagnosis not present

## 2019-07-17 LAB — CBC WITH DIFFERENTIAL/PLATELET
Abs Immature Granulocytes: 0.05 10*3/uL (ref 0.00–0.07)
Basophils Absolute: 0.1 10*3/uL (ref 0.0–0.1)
Basophils Relative: 1 %
Eosinophils Absolute: 0.3 10*3/uL (ref 0.0–0.5)
Eosinophils Relative: 3 %
HCT: 42.2 % (ref 39.0–52.0)
Hemoglobin: 13.4 g/dL (ref 13.0–17.0)
Immature Granulocytes: 1 %
Lymphocytes Relative: 22 %
Lymphs Abs: 1.8 10*3/uL (ref 0.7–4.0)
MCH: 28.9 pg (ref 26.0–34.0)
MCHC: 31.8 g/dL (ref 30.0–36.0)
MCV: 90.9 fL (ref 80.0–100.0)
Monocytes Absolute: 1 10*3/uL (ref 0.1–1.0)
Monocytes Relative: 12 %
Neutro Abs: 5.2 10*3/uL (ref 1.7–7.7)
Neutrophils Relative %: 61 %
Platelets: 296 10*3/uL (ref 150–400)
RBC: 4.64 MIL/uL (ref 4.22–5.81)
RDW: 13.6 % (ref 11.5–15.5)
WBC: 8.5 10*3/uL (ref 4.0–10.5)
nRBC: 0 % (ref 0.0–0.2)

## 2019-07-17 LAB — COMPREHENSIVE METABOLIC PANEL
ALT: 26 U/L (ref 0–44)
AST: 18 U/L (ref 15–41)
Albumin: 3.9 g/dL (ref 3.5–5.0)
Alkaline Phosphatase: 72 U/L (ref 38–126)
Anion gap: 10 (ref 5–15)
BUN: 23 mg/dL (ref 8–23)
CO2: 23 mmol/L (ref 22–32)
Calcium: 9.1 mg/dL (ref 8.9–10.3)
Chloride: 104 mmol/L (ref 98–111)
Creatinine, Ser: 1.38 mg/dL — ABNORMAL HIGH (ref 0.61–1.24)
GFR calc Af Amer: 58 mL/min — ABNORMAL LOW (ref >60)
GFR calc non Af Amer: 50 mL/min — ABNORMAL LOW (ref >60)
Glucose, Bld: 87 mg/dL (ref 70–99)
Potassium: 4 mmol/L (ref 3.5–5.1)
Sodium: 137 mmol/L (ref 135–145)
Total Bilirubin: 0.4 mg/dL (ref 0.3–1.2)
Total Protein: 7.3 g/dL (ref 6.5–8.1)

## 2019-07-17 NOTE — Progress Notes (Signed)
Hematology/Oncology Follow up note [facility] [phone] [phone]   Patient Care Team: Adin Hector, MD as PCP - General (Internal Medicine) Telford Nab, RN as Registered Nurse  . REASON FOR VISIT Follow up for treatment of squamous lung cancer, thyroid nodule HISTORY OF PRESENTING ILLNESS:  Marvin Sheriff. is a  74 y.o.  male follows up for management of squamous lung cancer, stage IIB (cT3,cN0 cM0) His case was discussed on tumor board.  Although he has mild asymmetric FDG activity iin the right hilum with SUV max of 2.9. This corresponds to a densely calcified hilar lymph node, consensus was reached that this is consistent with  old granulomatous disease. He is referred to Dr.Oaks to discuss about surgery.   #ill-defined right inferior lobe nodule in the thyroid, 1.7 cm by 1.1 x 1.1 cm. S/p biopsy which showed benign follicular nodule.  # 07/2017 finished SBRT. -He declines adjuvant chemotherapy. admitted from 10/23/2018- 10/25/2018 due to E. coli sepsis/UTI.    # followed up with vascular surgeon of peripheral vascular disease. On Aspirin. Off plavix due to easy bruising.    INTERVAL HISTORY Marvin Williams. is a 74 y.o. male who has above history reviewed by me present for follow up of stage II B squamous lung cancer.  Patient reports feeling well during the interval. He has a chronic " smoker's cough" in the morning. Denies any shortness of breath, hemoptysis, unintentional weight loss, fever or chills. He has an interval CT surveillance scan done presents to discuss results.   . Review of Systems  Constitutional: Negative for chills, fever, malaise/fatigue and weight loss.  HENT: Negative for sore throat.   Eyes: Negative for redness.  Respiratory: Negative for cough, shortness of breath and wheezing.   Cardiovascular: Negative for chest pain, palpitations and leg swelling.  Gastrointestinal: Negative for abdominal  pain, blood in stool, nausea and vomiting.  Genitourinary: Negative for dysuria.  Musculoskeletal: Negative for myalgias.  Skin: Negative for rash.  Neurological: Negative for dizziness, tingling and tremors.  Endo/Heme/Allergies: Does not bruise/bleed easily.  Psychiatric/Behavioral: Negative for hallucinations.    MEDICAL HISTORY:  Past Medical History:  Diagnosis Date  . Cancer ([place])    skin  . HTN (hypertension)   . Hyperlipemia   . Lung cancer ([place])   . Peripheral arterial disease ([place])     SURGICAL HISTORY: Past Surgical History:  Procedure Laterality Date  . BACK SURGERY    . BACK SURGERY     1971  . COLONOSCOPY    . LOWER EXTREMITY ANGIOGRAPHY Right 05/01/2018   Procedure: LOWER EXTREMITY ANGIOGRAPHY;  Surgeon: Algernon Huxley, MD;  Location: [facility];  Service: Cardiovascular;  Laterality: Right;  . LOWER EXTREMITY ANGIOGRAPHY Left 05/08/2018   Procedure: LOWER EXTREMITY ANGIOGRAPHY;  Surgeon: Algernon Huxley, MD;  Location: [facility];  Service: Cardiovascular;  Laterality: Left;    SOCIAL HISTORY: Social History   Socioeconomic History  . Marital status: Married    Spouse name: Not on file  . Number of children: Not on file  . Years of education: Not on file  . Highest education level: Not on file  Occupational History  . Not on file  Tobacco Use  . Smoking status: Former Research scientist (life sciences)  . Smokeless tobacco: Never Used  . Tobacco comment: Last cigarette on 06/04/17  Substance and Sexual Activity  . Alcohol use: No  . Drug use: No  . Sexual activity: Not on  file  Other Topics Concern  . Not on file  Social History Narrative  . Not on file   Social Determinants of Health   Financial Resource Strain:   . Difficulty of Paying Living Expenses: Not on file  Food Insecurity:   . Worried About Charity fundraiser in the Last Year: Not on file  . Ran Out of Food in the Last Year: Not on file  Transportation Needs:   . Lack of Transportation  (Medical): Not on file  . Lack of Transportation (Non-Medical): Not on file  Physical Activity:   . Days of Exercise per Week: Not on file  . Minutes of Exercise per Session: Not on file  Stress:   . Feeling of Stress : Not on file  Social Connections:   . Frequency of Communication with Friends and Family: Not on file  . Frequency of Social Gatherings with Friends and Family: Not on file  . Attends Religious Services: Not on file  . Active Member of Clubs or Organizations: Not on file  . Attends Archivist Meetings: Not on file  . Marital Status: Not on file  Intimate Partner Violence:   . Fear of Current or Ex-Partner: Not on file  . Emotionally Abused: Not on file  . Physically Abused: Not on file  . Sexually Abused: Not on file    FAMILY HISTORY: Family History  Problem Relation Age of Onset  . Cancer Mother   . Cancer Father   . Cancer Sister     ALLERGIES:  is allergic to naproxen.  MEDICATIONS:  Current Outpatient Medications  Medication Sig Dispense Refill  . aspirin 81 MG chewable tablet Chew by mouth daily.    [name] atorvastatin (LIPITOR) 10 MG tablet TAKE 1 TABLET BY MOUTH DAILY 30 tablet 11  . Cholecalciferol (VITAMIN D) 125 MCG (5000 UT) CAPS Take 1 capsule by mouth daily.    [name] losartan-hydrochlorothiazide (HYZAAR) 100-12.5 MG tablet Take 1 tablet by mouth daily.    . tamsulosin (FLOMAX) 0.4 MG CAPS capsule Take 1 capsule (0.4 mg total) by mouth daily. 90 capsule 1  . thiamine (VITAMIN B-1) 100 MG tablet Take 100 mg by mouth daily.    [name] zinc gluconate 50 MG tablet Take 50 mg by mouth daily.     No current facility-administered medications for this visit.     PHYSICAL EXAMINATION: ECOG PERFORMANCE STATUS: 1 - Symptomatic but completely ambulatory Vitals:   07/17/19 1318  BP: 118/74  Pulse: 84  Resp: 18  Temp: (!) 96.5 F (35.8 C)  SpO2: 97%   Filed Weights   07/17/19 1318  Weight: 181 lb 12.8 oz (82.5 kg)    Physical Exam    Constitutional: He is oriented to person, place, and time. No distress.  HENT:  Head: Normocephalic and atraumatic.  Mouth/Throat: No oropharyngeal exudate.  Eyes: Pupils are equal, round, and reactive to light. Conjunctivae and EOM are normal. Left eye exhibits no discharge. No scleral icterus.  Neck: No JVD present.  Cardiovascular: Normal rate, regular rhythm and normal heart sounds. Exam reveals no friction rub.  No murmur heard. Pulmonary/Chest: Effort normal. No respiratory distress.  Decreased breath sounds bilaterally.  Abdominal: Soft. Bowel sounds are normal. He exhibits no distension.  Musculoskeletal:        General: No edema. Normal range of motion.     Cervical back: Normal range of motion and neck supple.  Lymphadenopathy:    He has no cervical adenopathy.  Neurological: He  is alert and oriented to person, place, and time.  Skin: Skin is warm and dry. He is not diaphoretic. No erythema.  Psychiatric: Affect normal.     LABORATORY DATA:  I have reviewed the data as listed Lab Results  Component Value Date   WBC 8.5 07/17/2019   HGB 13.4 07/17/2019   HCT 42.2 07/17/2019   MCV 90.9 07/17/2019   PLT 296 07/17/2019   Recent Labs    10/23/18 0529 10/25/18 0557 01/10/19 0929 04/11/19 0904 07/17/19 1258  NA 134*  --  137 136 137  K 3.5  --  5.3* 4.1 4.0  CL 100  --  104 107 104  CO2 21*  --  '25 23 23  '$ GLUCOSE 187*  --  109* 116* 87  BUN 21  --  24* 23 23  CREATININE 1.42*   < > 1.57* 1.42* 1.38*  CALCIUM 9.0  --  9.4 9.1 9.1  GFRNONAA 48*   < > 43* 48* 50*  GFRAA 56*   < > 50* 56* 58*  PROT 7.4  --  7.5 7.3 7.3  ALBUMIN 3.8  --  3.7 3.9 3.9  AST 16  --  '15 22 18  '$ ALT 20  --  '16 27 26  '$ ALKPHOS 56  --  58 64 72  BILITOT 0.9  --  0.6 0.5 0.4  BILIDIR 0.2  --   --   --   --   IBILI 0.7  --   --   --   --    < > = values in this interval not displayed.   RADIOGRAPHIC STUDIES: I have personally reviewed the radiological images as listed and agreed with  the findings in the report. 07/06/2018 CT chest with contrast Showed continued reduction in right lower lobe lung mass.  Continue evaluation of surrounding radiation changes.  No evidence of metastatic disease in the chest. CT Chest Wo Contrast  Result Date: 07/13/2019 CLINICAL DATA:  Restaging Lung cancer right-sided chest pain EXAM: CT CHEST WITHOUT CONTRAST TECHNIQUE: Multidetector CT imaging of the chest was performed following the standard protocol without IV contrast. COMPARISON:  04/09/2019 FINDINGS: Cardiovascular: Normal heart size. Aortic atherosclerosis. Lad, left circumflex coronary artery calcifications. Mediastinum/Nodes: Normal appearance of the thyroid gland. The trachea appears patent and is midline. Normal appearance of the esophagus. Calcified mediastinal and hilar lymph nodes are again noted compatible with chronic granulomatous disease. No axillary, supraclavicular, mediastinal or hilar adenopathy. Lungs/Pleura: No pleural effusion identified. Centrilobular and paraseptal emphysema. Patchy peripheral areas of ground-glass attenuation,, cylindrical bronchiectasis and interstitial reticulation identified bilaterally. As mentioned previously findings are suggestive of underlying interstitial lung disease. Right lower lobe masslike area of architectural distortion and fibrosis involving the superior segment of the right lower lobe is again noted and appears stable compatible with changes due to external beam radiation. Calcified granulomas are again noted. Unchanged 3 mm nodule within the lateral left upper lobe, image 38/3. No new or enlarging pulmonary nodule or mass identified. Upper Abdomen: Unchanged low-attenuation structure within the liver measuring 1 cm, image 131/2. Bilateral kidney cysts are again noted. These are incompletely characterized without IV contrast. Musculoskeletal: No chest wall mass or suspicious bone lesions identified. IMPRESSION: 1. Stable post treatment changes with  masslike architectural distortion and fibrosis within the right lower lobe. No specific findings to indicate local recurrence or metastatic disease within the chest. 2. Emphysema 3. Evidence of interstitial lung disease such as usual interstitial pneumonia (UIP) versus nonspecific interstitial pneumonia (NSIP).  4. Aortic atherosclerosis with coronary artery calcifications 5. Aortic Atherosclerosis (ICD10-I70.0) and Emphysema (ICD10-J43.9). Electronically Signed   By: Kerby Moors M.D.   On: 07/13/2019 14:39    ASSESSMENT & PLAN:  Cancer Staging Squamous carcinoma of lung, right ([place]) Staging form: Lung, AJCC 8th Edition - Clinical stage from 06/16/2017: Stage IIB (cT3, cN0, cM0) - Signed by Earlie Server, MD on 06/16/2017  1. Squamous carcinoma of lung, right (H[facility])   2. Stage 3a chronic kidney disease   3. Pulmonary emphysema, unspecified emphysema type ([place])    # Stage IIB lung squamous cancer s/p RT, finished at the end of February 2019. CT chest images were independent reviewed by me and discussed with patient. No recurrence or metastatic disease. Recommend continue CT surveillance in 4 months.  If stable will increase the interval to every 6 months  He agrees with the plan.  #CKD, creatinine is stable.  Advised patient to avoid nephrotoxins. Patient has had a few serial CT chest without contrast.  I plan to obtain CT chest with contrast at the next visit. Advised patient to increase hydration before and after contrast study.  He agrees with the plan. I also sent multiple myeloma panel which is pending at the time of dictation  #COPD/emphysema. He also has radiographic findings of interstitial lung disease.  I encourage patient to continue follow-up with Dr. Raul Del.  He voices understanding . The patient knows to call the clinic with any problems questions or concerns. Orders Placed This Encounter  Procedures  . CT CHEST W CONTRAST    Standing Status:   Future    Standing Expiration  Date:   07/16/2020    Order Specific Question:   ** REASON FOR EXAM (FREE TEXT)    Answer:   lung cancer follow up    Order Specific Question:   If indicated for the ordered procedure, I authorize the administration of contrast media per Radiology protocol    Answer:   Yes    Order Specific Question:   Preferred imaging location?    Answer:   [facility]    Order Specific Question:   Radiology Contrast Protocol - do NOT remove file path    Answer:   [email or web address]  . CBC with Differential/Platelet    Standing Status:   Future    Standing Expiration Date:   07/16/2020  . Comprehensive metabolic panel    Standing Status:   Future    Standing Expiration Date:   07/16/2020    Return of visit: 4 months for reevaluation and discuss CT scanning   Earlie Server, MD, PhD Hematology Oncology [facility] Pager- [phone] 07/17/2019

## 2019-07-17 NOTE — Progress Notes (Signed)
Patient here for follow up. No concerns voiced.  °

## 2019-07-18 LAB — KAPPA/LAMBDA LIGHT CHAINS
Kappa free light chain: 39.8 mg/L — ABNORMAL HIGH (ref 3.3–19.4)
Kappa, lambda light chain ratio: 1.49 (ref 0.26–1.65)
Lambda free light chains: 26.8 mg/L — ABNORMAL HIGH (ref 5.7–26.3)

## 2019-07-19 LAB — MULTIPLE MYELOMA PANEL, SERUM
Albumin SerPl Elph-Mcnc: 3.5 g/dL (ref 2.9–4.4)
Albumin/Glob SerPl: 1.2 (ref 0.7–1.7)
Alpha 1: 0.3 g/dL (ref 0.0–0.4)
Alpha2 Glob SerPl Elph-Mcnc: 0.8 g/dL (ref 0.4–1.0)
B-Globulin SerPl Elph-Mcnc: 1 g/dL (ref 0.7–1.3)
Gamma Glob SerPl Elph-Mcnc: 1 g/dL (ref 0.4–1.8)
Globulin, Total: 3.1 g/dL (ref 2.2–3.9)
IgA: 177 mg/dL (ref 61–437)
IgG (Immunoglobin G), Serum: 1146 mg/dL (ref 603–1613)
IgM (Immunoglobulin M), Srm: 71 mg/dL (ref 15–143)
Total Protein ELP: 6.6 g/dL (ref 6.0–8.5)

## 2019-07-25 ENCOUNTER — Ambulatory Visit (INDEPENDENT_AMBULATORY_CARE_PROVIDER_SITE_OTHER): Payer: Medicare Other | Admitting: Nurse Practitioner

## 2019-07-25 ENCOUNTER — Ambulatory Visit (INDEPENDENT_AMBULATORY_CARE_PROVIDER_SITE_OTHER): Payer: Medicare Other

## 2019-07-25 ENCOUNTER — Other Ambulatory Visit: Payer: Self-pay

## 2019-07-25 ENCOUNTER — Telehealth (INDEPENDENT_AMBULATORY_CARE_PROVIDER_SITE_OTHER): Payer: Self-pay | Admitting: Vascular Surgery

## 2019-07-25 ENCOUNTER — Encounter (INDEPENDENT_AMBULATORY_CARE_PROVIDER_SITE_OTHER): Payer: Self-pay | Admitting: Nurse Practitioner

## 2019-07-25 ENCOUNTER — Other Ambulatory Visit (INDEPENDENT_AMBULATORY_CARE_PROVIDER_SITE_OTHER): Payer: Self-pay | Admitting: Nurse Practitioner

## 2019-07-25 VITALS — BP 148/74 | HR 90 | Resp 12 | Ht 68.0 in | Wt 181.0 lb

## 2019-07-25 DIAGNOSIS — I739 Peripheral vascular disease, unspecified: Secondary | ICD-10-CM | POA: Diagnosis not present

## 2019-07-25 DIAGNOSIS — M79604 Pain in right leg: Secondary | ICD-10-CM

## 2019-07-25 DIAGNOSIS — Z95828 Presence of other vascular implants and grafts: Secondary | ICD-10-CM

## 2019-07-25 DIAGNOSIS — I10 Essential (primary) hypertension: Secondary | ICD-10-CM

## 2019-07-25 DIAGNOSIS — I70213 Atherosclerosis of native arteries of extremities with intermittent claudication, bilateral legs: Secondary | ICD-10-CM

## 2019-07-25 NOTE — Telephone Encounter (Signed)
States her husband called yesterday because he is having a lot of pain in his leg with the stent. Wants a call back to advise as they are nervous that something is going on with the stent and it may be out of place or something. Please advise

## 2019-07-25 NOTE — Telephone Encounter (Signed)
Per Arna Medici see notes below. Thank you

## 2019-07-25 NOTE — Telephone Encounter (Signed)
Lets bring him in with ABIs relatively soon ..today or tomorrow

## 2019-07-25 NOTE — Telephone Encounter (Signed)
Patient was last seen on 03/01/20 and had a ABI and visit, next appt on 09/04/19. Patient had a right leg angio on 05/02/19 and a left leg angio on 05/09/19. Please advise. Thank you

## 2019-07-26 ENCOUNTER — Other Ambulatory Visit
Admission: RE | Admit: 2019-07-26 | Discharge: 2019-07-26 | Disposition: A | Discharge status: Home / Self Care | Payer: Medicare Other | Source: Ambulatory Visit | Attending: Vascular Surgery | Admitting: Vascular Surgery

## 2019-07-26 ENCOUNTER — Telehealth (INDEPENDENT_AMBULATORY_CARE_PROVIDER_SITE_OTHER): Payer: Self-pay

## 2019-07-26 DIAGNOSIS — Z01812 Encounter for preprocedural laboratory examination: Secondary | ICD-10-CM | POA: Diagnosis present

## 2019-07-26 DIAGNOSIS — Z20822 Contact with and (suspected) exposure to covid-19: Secondary | ICD-10-CM | POA: Diagnosis not present

## 2019-07-26 NOTE — Telephone Encounter (Signed)
Spoke with the patient and he is now scheduled with Dr. Lucky Cowboy for a RLE angio on 07/30/19 with a 9:45 am arrival time to the [facility]. Patient will do covid testing on 07/27/19 between 12:30-2:30 pm at the [facility]. Pre-procedure instructions were discussed and will be mailed.

## 2019-07-27 LAB — SARS CORONAVIRUS 2 (TAT 6-24 HRS): SARS Coronavirus 2: NEGATIVE

## 2019-07-30 ENCOUNTER — Encounter (INDEPENDENT_AMBULATORY_CARE_PROVIDER_SITE_OTHER): Payer: Self-pay | Admitting: Nurse Practitioner

## 2019-07-30 ENCOUNTER — Encounter
Admission: RE | Disposition: A | Discharge status: Home / Self Care | Payer: Self-pay | Source: Home / Self Care | Attending: Vascular Surgery

## 2019-07-30 ENCOUNTER — Other Ambulatory Visit (INDEPENDENT_AMBULATORY_CARE_PROVIDER_SITE_OTHER): Payer: Self-pay | Admitting: Nurse Practitioner

## 2019-07-30 ENCOUNTER — Encounter: Payer: Self-pay | Admitting: Vascular Surgery

## 2019-07-30 ENCOUNTER — Ambulatory Visit
Admission: RE | Admit: 2019-07-30 | Discharge: 2019-07-30 | Disposition: A | Discharge status: Home / Self Care | Payer: Medicare Other | Attending: Vascular Surgery | Admitting: Vascular Surgery

## 2019-07-30 DIAGNOSIS — I70221 Atherosclerosis of native arteries of extremities with rest pain, right leg: Secondary | ICD-10-CM | POA: Insufficient documentation

## 2019-07-30 DIAGNOSIS — I70229 Atherosclerosis of native arteries of extremities with rest pain, unspecified extremity: Secondary | ICD-10-CM

## 2019-07-30 DIAGNOSIS — I10 Essential (primary) hypertension: Secondary | ICD-10-CM | POA: Diagnosis not present

## 2019-07-30 DIAGNOSIS — I70213 Atherosclerosis of native arteries of extremities with intermittent claudication, bilateral legs: Secondary | ICD-10-CM | POA: Diagnosis not present

## 2019-07-30 DIAGNOSIS — E785 Hyperlipidemia, unspecified: Secondary | ICD-10-CM | POA: Insufficient documentation

## 2019-07-30 DIAGNOSIS — Z886 Allergy status to analgesic agent status: Secondary | ICD-10-CM | POA: Insufficient documentation

## 2019-07-30 DIAGNOSIS — Z79899 Other long term (current) drug therapy: Secondary | ICD-10-CM | POA: Insufficient documentation

## 2019-07-30 DIAGNOSIS — Z7982 Long term (current) use of aspirin: Secondary | ICD-10-CM | POA: Diagnosis not present

## 2019-07-30 DIAGNOSIS — Z87891 Personal history of nicotine dependence: Secondary | ICD-10-CM | POA: Diagnosis not present

## 2019-07-30 HISTORY — PX: LOWER EXTREMITY ANGIOGRAPHY: CATH118251

## 2019-07-30 SURGERY — LOWER EXTREMITY ANGIOGRAPHY
Anesthesia: Moderate Sedation | Laterality: Right

## 2019-07-30 MED ORDER — ONDANSETRON HCL 4 MG/2ML IJ SOLN: 4.0000 mg | Freq: Four times a day (QID) | INTRAMUSCULAR | Status: DC | PRN

## 2019-07-30 MED ORDER — TRAMADOL HCL 50 MG PO TABS: 50.0000 mg | ORAL_TABLET | Freq: Three times a day (TID) | ORAL | 0 refills | Status: DC | PRN

## 2019-07-30 MED ORDER — HEPARIN SODIUM (PORCINE) 1000 UNIT/ML IJ SOLN
INTRAMUSCULAR | Status: AC
  Filled 2019-07-30: qty 1

## 2019-07-30 MED ORDER — CLOPIDOGREL BISULFATE 300 MG PO TABS: 300.0000 mg | ORAL_TABLET | Freq: Once | ORAL | Status: DC

## 2019-07-30 MED ORDER — HEPARIN SODIUM (PORCINE) 1000 UNIT/ML IJ SOLN
INTRAMUSCULAR | Status: DC | PRN
  Administered 2019-07-30: 5000 [IU] via INTRAVENOUS

## 2019-07-30 MED ORDER — CLOPIDOGREL BISULFATE 75 MG PO TABS: 75.0000 mg | ORAL_TABLET | Freq: Every day | ORAL | 3 refills | Status: DC

## 2019-07-30 MED ORDER — FENTANYL CITRATE (PF) 100 MCG/2ML IJ SOLN
INTRAMUSCULAR | Status: AC
  Filled 2019-07-30: qty 2

## 2019-07-30 MED ORDER — SODIUM CHLORIDE 0.9 % IV SOLN
INTRAVENOUS | Status: DC
  Administered 2019-07-30: 1000 mL via INTRAVENOUS

## 2019-07-30 MED ORDER — MIDAZOLAM HCL 2 MG/ML PO SYRP: 8.0000 mg | ORAL_SOLUTION | Freq: Once | ORAL | Status: DC | PRN

## 2019-07-30 MED ORDER — DIPHENHYDRAMINE HCL 50 MG/ML IJ SOLN: 50.0000 mg | Freq: Once | INTRAMUSCULAR | Status: DC | PRN

## 2019-07-30 MED ORDER — FENTANYL CITRATE (PF) 100 MCG/2ML IJ SOLN
INTRAMUSCULAR | Status: DC | PRN
  Administered 2019-07-30 (×3): 25 ug via INTRAVENOUS
  Administered 2019-07-30 (×2): 50 ug via INTRAVENOUS
  Administered 2019-07-30 (×3): 25 ug via INTRAVENOUS

## 2019-07-30 MED ORDER — OXYCODONE HCL 5 MG PO TABS: 5.0000 mg | ORAL_TABLET | Freq: Once | ORAL | Status: AC

## 2019-07-30 MED ORDER — CEFAZOLIN SODIUM-DEXTROSE 2-4 GM/100ML-% IV SOLN
INTRAVENOUS | Status: AC
  Filled 2019-07-30: qty 100

## 2019-07-30 MED ORDER — METHYLPREDNISOLONE SODIUM SUCC 125 MG IJ SOLR: 125.0000 mg | Freq: Once | INTRAMUSCULAR | Status: DC | PRN

## 2019-07-30 MED ORDER — FAMOTIDINE 20 MG PO TABS: 40.0000 mg | ORAL_TABLET | Freq: Once | ORAL | Status: DC | PRN

## 2019-07-30 MED ORDER — OXYCODONE HCL 5 MG PO TABS
ORAL_TABLET | ORAL | Status: AC
  Administered 2019-07-30: 13:00:00 5 mg via ORAL
  Filled 2019-07-30: qty 1

## 2019-07-30 MED ORDER — CLOPIDOGREL BISULFATE 75 MG PO TABS
ORAL_TABLET | ORAL | Status: AC
  Administered 2019-07-30: 14:00:00 300 mg via ORAL
  Filled 2019-07-30: qty 4

## 2019-07-30 MED ORDER — CEFAZOLIN SODIUM-DEXTROSE 2-4 GM/100ML-% IV SOLN
2.0000 g | Freq: Once | INTRAVENOUS | Status: AC
  Administered 2019-07-30: 11:00:00 2 g via INTRAVENOUS

## 2019-07-30 MED ORDER — HYDROMORPHONE HCL 1 MG/ML IJ SOLN: 1.0000 mg | Freq: Once | INTRAMUSCULAR | Status: DC | PRN

## 2019-07-30 MED ORDER — IODIXANOL 320 MG/ML IV SOLN
INTRAVENOUS | Status: DC | PRN
  Administered 2019-07-30: 90 mL via INTRA_ARTERIAL

## 2019-07-30 MED ORDER — SODIUM CHLORIDE 0.9 % IV BOLUS
INTRAVENOUS | Status: AC | PRN
  Administered 2019-07-30: 250 mL via INTRAVENOUS

## 2019-07-30 MED ORDER — CLOPIDOGREL BISULFATE 300 MG PO TABS: 300.0000 mg | ORAL_TABLET | Freq: Once | ORAL | Status: AC

## 2019-07-30 MED ORDER — MIDAZOLAM HCL 5 MG/5ML IJ SOLN
INTRAMUSCULAR | Status: AC
  Filled 2019-07-30: qty 5

## 2019-07-30 MED ORDER — MIDAZOLAM HCL 2 MG/2ML IJ SOLN
INTRAMUSCULAR | Status: DC | PRN
  Administered 2019-07-30 (×5): 0.5 mg via INTRAVENOUS
  Administered 2019-07-30: 2 mg via INTRAVENOUS
  Administered 2019-07-30: 0.5 mg via INTRAVENOUS

## 2019-07-30 SURGICAL SUPPLY — 23 items
BALLN LUTONIX 018 5X150X130 (BALLOONS) ×2
BALLN LUTONIX 018 5X300X130 (BALLOONS) ×2
BALLN LUTONIX DCB 6X60X130 (BALLOONS) ×2
BALLN ULTRVRSE 2.5X300X150 (BALLOONS) ×2
BALLOON LUTONIX 018 5X150X130 (BALLOONS) ×1 IMPLANT
BALLOON LUTONIX 018 5X300X130 (BALLOONS) ×1 IMPLANT
BALLOON LUTONIX DCB 6X60X130 (BALLOONS) ×1 IMPLANT
BALLOON ULTRVRSE 2.5X300X150 (BALLOONS) ×1 IMPLANT
CATH BEACON 5 .038 100 VERT TP (CATHETERS) ×2 IMPLANT
CATH PIG 70CM (CATHETERS) ×2 IMPLANT
CATH ROTAREX 135 6FR (CATHETERS) ×2 IMPLANT
DEVICE PRESTO INFLATION (MISCELLANEOUS) ×2 IMPLANT
DEVICE STARCLOSE SE CLOSURE (Vascular Products) ×2 IMPLANT
PACK ANGIOGRAPHY (CUSTOM PROCEDURE TRAY) ×2 IMPLANT
SHEATH ANL2 6FRX45 HC (SHEATH) ×2 IMPLANT
SHEATH BRITE TIP 5FRX11 (SHEATH) ×2 IMPLANT
STENT VIABAHN 6X250X120 (Permanent Stent) ×2 IMPLANT
STENT VIABAHN 6X50X120 (Permanent Stent) ×2 IMPLANT
SYR MEDRAD MARK 7 150ML (SYRINGE) ×2 IMPLANT
TUBING CONTRAST HIGH PRESS 72 (TUBING) ×2 IMPLANT
WIRE G V18X300CM (WIRE) ×2 IMPLANT
WIRE GUIDERIGHT .035X150 (WIRE) ×2 IMPLANT
WIRE MAGIC TORQUE 260C (WIRE) ×2 IMPLANT

## 2019-07-30 NOTE — H&P (Signed)
[facility] History & Physical Update  The patient was interviewed and re-examined.  The patient's previous History and Physical has been reviewed and is unchanged.  There is no change in the plan of care. We plan to proceed with the scheduled procedure.  Leotis Pain, MD  07/30/2019, 9:52 AM

## 2019-07-30 NOTE — Op Note (Signed)
[facility]  Percutaneous Study/Intervention Procedural Note   Date of Surgery: 07/30/2019  Surgeon(s):Eveleigh Crumpler    Assistants:none  Pre-operative Diagnosis: PAD with rest Williams right leg  Post-operative diagnosis:  Same  Procedure(s) Performed:             1.  Ultrasound guidance for vascular access left femoral artery             2.  Catheter placement into right common femoral artery from left femoral approach             3.  Aortogram and selective right lower extremity angiogram including selective imaging of the posterior tibial artery             4.  Percutaneous transluminal angioplasty of left external iliac artery with 6 mm diameter by 6 cm length Lutonix drug-coated angioplasty balloon             5.   Mechanical thrombectomy with the roto-Rx thrombectomy device to the right SFA and popliteal artery  6.  Viabahn stent placement x2 to the right SFA and popliteal arteries with 6 mm diameter by 25 cm length stent is 6 mm diameter by 5 cm length stent  7.  Percutaneous transluminal angioplasty of the right posterior tibial artery with 2.5 mm diameter by 30 cm length angioplasty balloon             8.  StarClose closure device left femoral artery  EBL: 100 cc  Contrast: 90 cc  Fluoro Time: 9.3 minutes  Moderate Conscious Sedation Time: approximately 45 minutes using 5 mg of Versed and 250 mcg of Fentanyl              Indications:  Patient is a 74 y.o.male with recurrent rest Williams of the right leg. The patient has noninvasive study showing reduced flow on the right. The patient is brought in for angiography for further evaluation and potential treatment.  Due to the limb threatening nature of the situation, angiogram was performed for attempted limb salvage. The patient is aware that if the procedure fails, amputation would be expected.  The patient also understands that even with successful revascularization, amputation may still be required due to the  severity of the situation.  Risks and benefits are discussed and informed consent is obtained.   Procedure:  The patient was identified and appropriate procedural time out was performed.  The patient was then placed supine on the table and prepped and draped in the usual sterile fashion. Moderate conscious sedation was administered during a face to face encounter with the patient throughout the procedure with my supervision of the RN administering medicines and monitoring the patient's vital signs, pulse oximetry, telemetry and mental status throughout from the start of the procedure until the patient was taken to the recovery room. Ultrasound was used to evaluate the left common femoral artery.  It was patent .  A digital ultrasound image was acquired.  A Seldinger needle was used to access the left common femoral artery under direct ultrasound guidance and a permanent image was performed.  A 0.035 J wire was advanced without resistance and a 5Fr sheath was placed.  Pigtail catheter was placed into the aorta and an AP aortogram was performed. This demonstrated normal renal arteries and normal aorta and right iliac segments without significant stenosis.  The left external iliac artery had what appeared to be a greater than 60% narrowing just beyond its origin.  I then crossed the  aortic bifurcation and advanced to the right femoral head. Selective right lower extremity angiogram was then performed. This demonstrated relatively normal common femoral artery and profunda femoris artery.  The SFA was occluded above the previously placed stents and there was reconstitution of the popliteal artery although opacification was fairly poor this appeared to be somewhat diseased.  There was two-vessel runoff through both the peroneal artery and posterior tibial arteries although distal opacification was poor and it was difficult to discern if these are continuous to the foot. It was felt that it was in the patient's best  interest to proceed with intervention after these images to avoid a second procedure and a larger amount of contrast and fluoroscopy based off of the findings from the initial angiogram. The patient was systemically heparinized and I started by performing balloon angioplasty of the left external iliac artery with a 6 mm diameter by 6 cm length Lutonix drug-coated angioplasty balloon inflated to 12 atm for 1 minute.  Completion imaging showed less than 10% residual stenosis.  A 6 French Ansell sheath was then placed over the Magic torque wire. I then used a Kumpe catheter and the Magic torque wire to easily cross the thrombotic occlusion of the right SFA and popliteal arteries with no difficulty.  It was clear this was thrombus, so I elected to use the roto-Rx thrombectomy device and after exchanging for a 0.018 wire I proceeded with 3 passes with the thrombectomy device throughout the SFA and popliteal arteries.  An additional 3 passes were done when there was residual thrombus after the first 3 passes.  A 6 mm diameter by 25 cm length Viabahn stent was then deployed from the proximal to mid SFA down to the proximal popliteal artery due to residual stenosis at the proximal edge of the previously placed stents as well as the distal edge of the previously placed stents.  Imaging following this showed residual disease in the popliteal artery below the newly placed stent that had not been well seen due to poor flow previously, an additional 6 mm diameter by 5 cm length Viabahn stent was deployed in the popliteal artery.  Stents were postdilated with 5 mm balloons.  Completion imaging showed less than 10% residual stenosis in the SFA and popliteal arteries, but the flow remained extremely sluggish distally.  I then passed the Kumpe catheter over the 0.018 wire down into the tibioperoneal trunk and then the posterior tibial artery where imaging was performed.  There was an abrupt occlusion of the posterior tibial artery  in the midsegment with essentially no flow distally on this image.  I was able to easily cross this lesion with a V 18 wire parking the wire in the foot.  Angioplasty was then performed with a 2.5 mm diameter by 30 cm length angioplasty balloon in the posterior tibial artery from the foot up to the proximal posterior tibial artery.  This was taken to 8 atm for 1 minute.  Completion imaging now showed inline flow into the foot with less than 30% residual stenosis near it was occluded.  There was some significant spasm distally but due to low blood pressure intra-arterial nitroglycerin was not given. I elected to terminate the procedure. The sheath was removed and StarClose closure device was deployed in the left femoral artery with excellent hemostatic result. The patient was taken to the recovery room in stable condition having tolerated the procedure well.  Findings:  Aortogram:  This demonstrated normal renal arteries and normal aorta and right iliac segments without significant stenosis.  The left external iliac artery had what appeared to be a greater than 60% narrowing just beyond its origin.             Right lower Extremity:  Relatively normal common femoral artery and profunda femoris artery.  The SFA was occluded above the previously placed stents and there was reconstitution of the popliteal artery although opacification was fairly poor this appeared to be somewhat diseased.  There was two-vessel runoff through both the peroneal artery and posterior tibial arteries although distal opacification was poor and it was difficult to discern if these are continuous to the foot.   Disposition: Patient was taken to the recovery room in stable condition having tolerated the procedure well.  Complications: None  Marvin Williams 07/30/2019 1:06 PM   This note was created with Dragon Medical transcription system. Any errors in dictation are purely unintentional.

## 2019-07-30 NOTE — Progress Notes (Signed)
SUBJECTIVE:  Patient ID: Marvin Skeeter., male    DOB: 12/01/1945, 74 y.o.   MRN: [id number] Chief Complaint  Patient presents with  . Follow-up    ABI    HPI  Marvin Sudol. is a 74 y.o. male presents today for noninvasive studies after having sudden right lower leg pain this weekend.  Previously the patient was able to ambulate without much difficulty however suddenly this weekend he began to have severe right leg pain that resulted in a significant decrease in his walking distance before experiencing severe claudication-like symptoms.  He denies rest pain or any ulceration.  He denies any ischemic-like symptoms.  Denies any fever, chills, nausea, vomiting or diarrhea.  The right lower extremity has a 0.54 ABI with the previous ABI being 1.01.  The left lower extremity has an ABI of 0.83 with the previous ABI being 0.73.  Previous study was done on 03/02/2019.  The patient had a limited duplex of the right lower extremity and the right popliteal, and tibial arteries have monophasic flow.  The left lower extremity has monophasic/biphasic flow in the tibial arteries.  The left great toe has good waveforms but the right is nearly flat.  Past Medical History:  Diagnosis Date  . Cancer ([place])    skin  . HTN (hypertension)   . Hyperlipemia   . Lung cancer ([place])   . Peripheral arterial disease [facility])     Past Surgical History:  Procedure Laterality Date  . BACK SURGERY    . BACK SURGERY     1971  . COLONOSCOPY    . LOWER EXTREMITY ANGIOGRAPHY Right 05/01/2018   Procedure: LOWER EXTREMITY ANGIOGRAPHY;  Surgeon: Algernon Huxley, MD;  Location: [place] [facility];  Service: Cardiovascular;  Laterality: Right;  . LOWER EXTREMITY ANGIOGRAPHY Left 05/08/2018   Procedure: LOWER EXTREMITY ANGIOGRAPHY;  Surgeon: Algernon Huxley, MD;  Location: [place] [facility];  Service: Cardiovascular;  Laterality: Left;    Social History   Socioeconomic History  . Marital status: Married    Spouse  name: Not on file  . Number of children: Not on file  . Years of education: Not on file  . Highest education level: Not on file  Occupational History  . Not on file  Tobacco Use  . Smoking status: Former Research scientist (life sciences)  . Smokeless tobacco: Never Used  . Tobacco comment: Last cigarette on 06/04/17  Substance and Sexual Activity  . Alcohol use: No  . Drug use: No  . Sexual activity: Not on file  Other Topics Concern  . Not on file  Social History Narrative  . Not on file   Social Determinants of Health   Financial Resource Strain:   . Difficulty of Paying Living Expenses: Not on file  Food Insecurity:   . Worried About Charity fundraiser in the Last Year: Not on file  . Ran Out of Food in the Last Year: Not on file  Transportation Needs:   . Lack of Transportation (Medical): Not on file  . Lack of Transportation (Non-Medical): Not on file  Physical Activity:   . Days of Exercise per Week: Not on file  . Minutes of Exercise per Session: Not on file  Stress:   . Feeling of Stress : Not on file  Social Connections:   . Frequency of Communication with Friends and Family: Not on file  . Frequency of Social Gatherings with Friends and Family: Not on file  . Attends  Religious Services: Not on file  . Active Member of Clubs or Organizations: Not on file  . Attends Archivist Meetings: Not on file  . Marital Status: Not on file  Intimate Partner Violence:   . Fear of Current or Ex-Partner: Not on file  . Emotionally Abused: Not on file  . Physically Abused: Not on file  . Sexually Abused: Not on file    Family History  Problem Relation Age of Onset  . Cancer Mother   . Cancer Father   . Cancer Sister     Allergies  Allergen Reactions  . Naproxen Other (See Comments)    Per patient: oncologist prefers he does NOT take      Review of Systems   Review of Systems: Negative Unless Checked Constitutional: [] Weight loss  [] Fever  [] Chills Cardiac: [] Chest pain   []   Atrial Fibrillation  [] Palpitations   [] Shortness of breath when laying flat   [] Shortness of breath with exertion. [] Shortness of breath at rest Vascular:  [] Pain in legs with walking   [] Pain in legs with standing [] Pain in legs when laying flat   [x] Claudication    [] Pain in feet when laying flat    [] History of DVT   [] Phlebitis   [] Swelling in legs   [] Varicose veins   [] Non-healing ulcers Pulmonary:   [] Uses home oxygen   [] Productive cough   [] Hemoptysis   [] Wheeze  [] COPD   [] Asthma Neurologic:  [] Dizziness   [] Seizures  [] Blackouts [] History of stroke   [] History of TIA  [] Aphasia   [] Temporary Blindness   [] Weakness or numbness in arm   [] Weakness or numbness in leg Musculoskeletal:   [] Joint swelling   [] Joint pain   [] Low back pain  []  History of Knee Replacement [] Arthritis [] back Surgeries  []  Spinal Stenosis    Hematologic:  [] Easy bruising  [] Easy bleeding   [] Hypercoagulable state   [] Anemic Gastrointestinal:  [] Diarrhea   [] Vomiting  [] Gastroesophageal reflux/heartburn   [] Difficulty swallowing. [] Abdominal pain Genitourinary:  [x] Chronic kidney disease   [] Difficult urination  [] Anuric   [] Blood in urine [] Frequent urination  [] Burning with urination   [] Hematuria Skin:  [] Rashes   [] Ulcers [] Wounds Psychological:  [] History of anxiety   []  History of major depression  []  Memory Difficulties      OBJECTIVE:   Physical Exam  BP (!) 148/74   Pulse 90   Resp 12   Ht 5\' 8"  (1.727 m)   Wt 181 lb (82.1 kg)   BMI 27.52 kg/m   Gen: WD/WN, NAD Head: [place]/AT, No temporalis wasting.  Ear/Nose/Throat: Hearing grossly intact, nares w/o erythema or drainage Eyes: PER, EOMI, sclera nonicteric.  Neck: Supple, no masses.  No JVD.  Pulmonary:  Good air movement, no use of accessory muscles.  Cardiac: RRR Vascular:  Cool toes on right leg Vessel Right Left  Dorsalis Pedis Not Palpable Palpable  Posterior Tibial Not Palpable Palpable   Gastrointestinal: soft, non-distended. No  guarding/no peritoneal signs.  Musculoskeletal: M/S 5/5 throughout.  No deformity or atrophy.  Neurologic: Pain and light touch intact in extremities.  Symmetrical.  Speech is fluent. Motor exam as listed above. Psychiatric: Judgment intact, Mood & affect appropriate for pt's clinical situation.        ASSESSMENT AND PLAN:  1. Atherosclerosis of native artery of both lower extremities with intermittent claudication (HCC) Recommend:  The patient has experienced increased symptoms and is now describing lifestyle limiting claudication and mild rest pain.   Given the severity  of the patient's lower extremity symptoms the patient should undergo angiography and intervention.  Risk and benefits were reviewed the patient.  Indications for the procedure were reviewed.  All questions were answered, the patient agrees to proceed.   The patient should continue walking and begin a more formal exercise program.  The patient should continue antiplatelet therapy and aggressive treatment of the lipid abnormalities  The patient will follow up with me after the angiogram.   2. Essential hypertension Blood pressure acceptable today.  Patient on appropriate medications.  No change needed.   No current facility-administered medications on file prior to visit.   Current Outpatient Medications on File Prior to Visit  Medication Sig Dispense Refill  . aspirin 81 MG chewable tablet Chew by mouth daily.    [name] atorvastatin (LIPITOR) 10 MG tablet TAKE 1 TABLET BY MOUTH DAILY 30 tablet 11  . Cholecalciferol (VITAMIN D) 125 MCG (5000 UT) CAPS Take 1 capsule by mouth daily.    [name] losartan-hydrochlorothiazide (HYZAAR) 100-12.5 MG tablet Take 1 tablet by mouth daily.    . magnesium 30 MG tablet Take 30 mg by mouth 2 (two) times daily.    . tamsulosin (FLOMAX) 0.4 MG CAPS capsule Take 1 capsule (0.4 mg total) by mouth daily. 90 capsule 1  . vitamin B-12 (CYANOCOBALAMIN) 100 MCG tablet Take 100 mcg by mouth daily.      [name] zinc gluconate 50 MG tablet Take 50 mg by mouth daily.    [name] thiamine (VITAMIN B-1) 100 MG tablet Take 100 mg by mouth daily.      There are no Patient Instructions on file for this visit. No follow-ups on file.   Kris Hartmann, NP  This note was completed with Sales executive.  Any errors are purely unintentional.

## 2019-07-31 ENCOUNTER — Encounter: Payer: Self-pay | Admitting: Cardiology

## 2019-08-06 ENCOUNTER — Encounter: Payer: Self-pay | Admitting: Cardiology

## 2019-08-29 DIAGNOSIS — R936 Abnormal findings on diagnostic imaging of limbs: Secondary | ICD-10-CM | POA: Insufficient documentation

## 2019-09-03 ENCOUNTER — Other Ambulatory Visit (INDEPENDENT_AMBULATORY_CARE_PROVIDER_SITE_OTHER): Payer: Self-pay | Admitting: Vascular Surgery

## 2019-09-03 DIAGNOSIS — I70221 Atherosclerosis of native arteries of extremities with rest pain, right leg: Secondary | ICD-10-CM

## 2019-09-03 DIAGNOSIS — Z95820 Peripheral vascular angioplasty status with implants and grafts: Secondary | ICD-10-CM

## 2019-09-04 ENCOUNTER — Ambulatory Visit (INDEPENDENT_AMBULATORY_CARE_PROVIDER_SITE_OTHER): Payer: Medicare Other | Admitting: Vascular Surgery

## 2019-09-04 ENCOUNTER — Other Ambulatory Visit: Payer: Self-pay

## 2019-09-04 ENCOUNTER — Ambulatory Visit (INDEPENDENT_AMBULATORY_CARE_PROVIDER_SITE_OTHER): Payer: Medicare Other

## 2019-09-04 ENCOUNTER — Encounter (INDEPENDENT_AMBULATORY_CARE_PROVIDER_SITE_OTHER): Payer: Self-pay | Admitting: Vascular Surgery

## 2019-09-04 VITALS — BP 115/67 | HR 83 | Ht 68.0 in | Wt 174.0 lb

## 2019-09-04 DIAGNOSIS — Z95820 Peripheral vascular angioplasty status with implants and grafts: Secondary | ICD-10-CM | POA: Diagnosis not present

## 2019-09-04 DIAGNOSIS — C3491 Malignant neoplasm of unspecified part of right bronchus or lung: Secondary | ICD-10-CM | POA: Diagnosis not present

## 2019-09-04 DIAGNOSIS — I70213 Atherosclerosis of native arteries of extremities with intermittent claudication, bilateral legs: Secondary | ICD-10-CM | POA: Diagnosis not present

## 2019-09-04 DIAGNOSIS — I70221 Atherosclerosis of native arteries of extremities with rest pain, right leg: Secondary | ICD-10-CM | POA: Diagnosis not present

## 2019-09-04 DIAGNOSIS — I10 Essential (primary) hypertension: Secondary | ICD-10-CM | POA: Diagnosis not present

## 2019-09-04 NOTE — Assessment & Plan Note (Signed)
ABIs today are 1.05 on the right up from 0.54 and stable on the left and 0.76.  His waveforms are good and his digit pressures are normal.  At this point, his pain is likely more related to arthritis or neuropathy with his perfusion returned to normal.  We will see him back in about 3 months with noninvasive studies.  He will continue his current medical regimen.

## 2019-09-04 NOTE — Progress Notes (Signed)
MRN : [id number]  Marvin Williams. is a 74 y.o. (06-19-1945) male who presents with chief complaint of  Chief Complaint  Patient presents with  . Follow-up    U/S Follow up  .  History of Present Illness: Patient returns today in follow up of his PAD.  He underwent extensive revascularization of the right lower extremity about a month ago and his ABIs today are 1.05 on the right up from 0.54 and stable on the left and 0.76.  He is still having a fair bit of pain in the right leg.  At this point, its in the ankle, knee, and hip and it sounds like his primary care physician is favoring arthritis.  He had no periprocedural complications.  His access site is well-healed  Current Outpatient Medications  Medication Sig Dispense Refill  . aspirin 81 MG chewable tablet Chew by mouth daily.    [name] atorvastatin (LIPITOR) 10 MG tablet TAKE 1 TABLET BY MOUTH DAILY 30 tablet 11  . clopidogrel (PLAVIX) 75 MG tablet Take 1 tablet (75 mg total) by mouth daily. 90 tablet 3  . losartan-hydrochlorothiazide (HYZAAR) 100-12.5 MG tablet Take 1 tablet by mouth daily.    . tamsulosin (FLOMAX) 0.4 MG CAPS capsule Take 1 capsule (0.4 mg total) by mouth daily. 90 capsule 1  . traMADol (ULTRAM) 50 MG tablet Take 1 tablet (50 mg total) by mouth every 8 (eight) hours as needed. 10 tablet 0  . Cholecalciferol (VITAMIN D) 125 MCG (5000 UT) CAPS Take 1 capsule by mouth daily.    . magnesium 30 MG tablet Take 30 mg by mouth 2 (two) times daily.    [name] thiamine (VITAMIN B-1) 100 MG tablet Take 100 mg by mouth daily.    . vitamin B-12 (CYANOCOBALAMIN) 100 MCG tablet Take 100 mcg by mouth daily.    [name] zinc gluconate 50 MG tablet Take 50 mg by mouth daily.     No current facility-administered medications for this visit.    Past Medical History:  Diagnosis Date  . Cancer ([place])    skin  . HTN (hypertension)   . Hyperlipemia   . Lung cancer ([place])   . Peripheral arterial disease [facility])     Past Surgical History:    Procedure Laterality Date  . BACK SURGERY    . BACK SURGERY     1971  . COLONOSCOPY    . ESOPHAGOGASTRODUODENOSCOPY    . LOWER EXTREMITY ANGIOGRAPHY Right 05/01/2018   Procedure: LOWER EXTREMITY ANGIOGRAPHY;  Surgeon: Algernon Huxley, MD;  Location: [place] [facility];  Service: Cardiovascular;  Laterality: Right;  . LOWER EXTREMITY ANGIOGRAPHY Left 05/08/2018   Procedure: LOWER EXTREMITY ANGIOGRAPHY;  Surgeon: Algernon Huxley, MD;  Location: [place] [facility];  Service: Cardiovascular;  Laterality: Left;  . LOWER EXTREMITY ANGIOGRAPHY Right 07/30/2019   Procedure: LOWER EXTREMITY ANGIOGRAPHY;  Surgeon: Algernon Huxley, MD;  Location: [place] [facility];  Service: Cardiovascular;  Laterality: Right;     Social History   Tobacco Use  . Smoking status: Former Research scientist (life sciences)  . Smokeless tobacco: Never Used  . Tobacco comment: Last cigarette on 06/04/17  Substance Use Topics  . Alcohol use: No  . Drug use: No    Family History  Problem Relation Age of Onset  . Cancer Mother   . Cancer Father   . Cancer Sister      Allergies  Allergen Reactions  . Naproxen Other (See Comments)    Per patient:  oncologist prefers he does NOT take     REVIEW OF SYSTEMS(Negative unless checked)  Constitutional: '[]'$ ??Weight loss'[]'$ ??Fever'[]'$ ??Chills Cardiac:'[]'$ ??Chest pain'[]'$ ??Chest pressure'[]'$ ??Palpitations '[]'$ ??Shortness of breath when laying flat '[]'$ ??Shortness of breath at rest '[]'$ ??Shortness of breath with exertion. Vascular: '[x]'$ ??Pain in legs with walking'[]'$ ??Pain in legsat rest'[]'$ ??Pain in legs when laying flat '[x]'$ ??Claudication '[]'$ ??Pain in feet when walking '[]'$ ??Pain in feet at rest '[]'$ ??Pain in feet when laying flat '[]'$ ??History of DVT '[]'$ ??Phlebitis '[]'$ ??Swelling in legs '[]'$ ??Varicose veins '[]'$ ??Non-healing ulcers Pulmonary: '[]'$ ??Uses home oxygen '[]'$ ??Productive cough'[]'$ ??Hemoptysis '[]'$ ??Wheeze '[]'$ ??COPD '[]'$ ??Asthma Neurologic: '[]'$ ??Dizziness '[]'$ ??Blackouts  '[]'$ ??Seizures '[]'$ ??History of stroke '[]'$ ??History of TIA'[]'$ ??Aphasia '[]'$ ??Temporary blindness'[]'$ ??Dysphagia '[]'$ ??Weaknessor numbness in arms '[]'$ ??Weakness or numbnessin legs Musculoskeletal: '[x]'$ ??Arthritis '[]'$ ??Joint swelling '[x]'$ ??Joint pain '[]'$ ??Low back pain Hematologic:'[x]'$ ??Easy bruising'[]'$ ??Easy bleeding '[]'$ ??Hypercoagulable state '[]'$ ??Anemic '[]'$ ??Hepatitis Gastrointestinal:'[]'$ ??Blood in stool'[]'$ ??Vomiting blood'[]'$ ??Gastroesophageal reflux/heartburn'[]'$ ??Abdominal pain Genitourinary: '[]'$ ??Chronic kidney disease '[]'$ ??Difficulturination '[]'$ ??Frequenturination '[]'$ ??Burning with urination'[]'$ ??Hematuria Skin: '[]'$ ??Rashes '[]'$ ??Ulcers '[]'$ ??Wounds Psychological: '[]'$ ??History of anxiety'[]'$ ??History of major depression.  Physical Examination  BP 115/67   Pulse 83   Ht '5\' 8"'$  (1.727 m)   Wt 174 lb (78.9 kg)   BMI 26.46 kg/m  Gen:  WD/WN, NAD Head: [place]/AT, No temporalis wasting. Ear/Nose/Throat: Hearing grossly intact, nares w/o erythema or drainage Eyes: Conjunctiva clear. Sclera non-icteric Neck: Supple.  Trachea midline Pulmonary:  Good air movement, no use of accessory muscles.  Cardiac: RRR, no JVD Vascular:  Vessel Right Left  Radial Palpable Palpable                          PT  2+ palpable  1+ palpable  DP  1+ palpable  1+ palpable   Gastrointestinal: soft, non-tender/non-distended. No guarding/reflex.  Musculoskeletal: M/S 5/5 throughout.  No deformity or atrophy.  No significant lower extremity edema. Neurologic: Sensation grossly intact in extremities.  Symmetrical.  Speech is fluent.  Psychiatric: Judgment intact, Mood & affect appropriate for pt's clinical situation. Dermatologic: No rashes or ulcers noted.  No cellulitis or open wounds.       Labs Recent Results (from the past 2160 hour(s))  Kappa/lambda light chains     Status: Abnormal   Collection Time: 07/17/19 12:58 PM  Result Value Ref Range   Kappa free light chain 39.8  (H) 3.3 - 19.4 mg/L   Lamda free light chains 26.8 (H) 5.7 - 26.3 mg/L   Kappa, lamda light chain ratio 1.49 0.26 - 1.65    Comment: (NOTE) Performed At: [facility] [place], [place] [id number] Rush Farmer MD [id number]   Multiple Myeloma Panel (SPEP&IFE w/QIG)     Status: None   Collection Time: 07/17/19 12:58 PM  Result Value Ref Range   IgG (Immunoglobin G), Serum 1,146 603 - 1,613 mg/dL   IgA 177 61 - 437 mg/dL   IgM (Immunoglobulin M), Srm 71 15 - 143 mg/dL   Total Protein ELP 6.6 6.0 - 8.5 g/dL   Albumin SerPl Elph-Mcnc 3.5 2.9 - 4.4 g/dL   Alpha 1 0.3 0.0 - 0.4 g/dL   Alpha2 Glob SerPl Elph-Mcnc 0.8 0.4 - 1.0 g/dL   B-Globulin SerPl Elph-Mcnc 1.0 0.7 - 1.3 g/dL   Gamma Glob SerPl Elph-Mcnc 1.0 0.4 - 1.8 g/dL   M Protein SerPl Elph-Mcnc Not Observed Not Observed g/dL   Globulin, Total 3.1 2.2 - 3.9 g/dL   Albumin/Glob SerPl 1.2 0.7 - 1.7   IFE 1 Comment     Comment: (NOTE) The immunofixation pattern appears unremarkable. Evidence of monoclonal protein is not apparent.    Please Note Comment  Comment: (NOTE) Protein electrophoresis scan will follow via computer, mail, or courier delivery. Performed At: [facility] [place], [place] [id number] Rush Farmer MD GQ:[id number]   Comprehensive metabolic panel     Status: Abnormal   Collection Time: 07/17/19 12:58 PM  Result Value Ref Range   Sodium 137 135 - 145 mmol/L   Potassium 4.0 3.5 - 5.1 mmol/L   Chloride 104 98 - 111 mmol/L   CO2 23 22 - 32 mmol/L   Glucose, Bld 87 70 - 99 mg/dL   BUN 23 8 - 23 mg/dL   Creatinine, Ser 1.38 (H) 0.61 - 1.24 mg/dL   Calcium 9.1 8.9 - 10.3 mg/dL   Total Protein 7.3 6.5 - 8.1 g/dL   Albumin 3.9 3.5 - 5.0 g/dL   AST 18 15 - 41 U/L   ALT 26 0 - 44 U/L   Alkaline Phosphatase 72 38 - 126 U/L   Total Bilirubin 0.4 0.3 - 1.2 mg/dL   GFR calc non Af Amer 50 (L) >60 mL/min   GFR calc Af Amer 58 (L) >60 mL/min   Anion gap 10 5 -  15    Comment: Performed at [facility], [address] [place], [place] [address]  CBC with Differential     Status: None   Collection Time: 07/17/19 12:58 PM  Result Value Ref Range   WBC 8.5 4.0 - 10.5 K/uL   RBC 4.64 4.22 - 5.81 MIL/uL   Hemoglobin 13.4 13.0 - 17.0 g/dL   HCT 42.2 39.0 - 52.0 %   MCV 90.9 80.0 - 100.0 fL   MCH 28.9 26.0 - 34.0 pg   MCHC 31.8 30.0 - 36.0 g/dL   RDW 13.6 11.5 - 15.5 %   Platelets 296 150 - 400 K/uL   nRBC 0.0 0.0 - 0.2 %   Neutrophils Relative % 61 %   Neutro Abs 5.2 1.7 - 7.7 K/uL   Lymphocytes Relative 22 %   Lymphs Abs 1.8 0.7 - 4.0 K/uL   Monocytes Relative 12 %   Monocytes Absolute 1.0 0.1 - 1.0 K/uL   Eosinophils Relative 3 %   Eosinophils Absolute 0.3 0.0 - 0.5 K/uL   Basophils Relative 1 %   Basophils Absolute 0.1 0.0 - 0.1 K/uL   Immature Granulocytes 1 %   Abs Immature Granulocytes 0.05 0.00 - 0.07 K/uL    Comment: Performed at [facility], [place], [place] [address]  SARS CORONAVIRUS 2 (TAT 6-24 HRS) Nasopharyngeal Nasopharyngeal Swab     Status: None   Collection Time: 07/26/19  1:23 PM   Specimen: Nasopharyngeal Swab  Result Value Ref Range   SARS Coronavirus 2 NEGATIVE NEGATIVE    Comment: (NOTE) SARS-CoV-2 target nucleic acids are NOT DETECTED. The SARS-CoV-2 RNA is generally detectable in upper and lower respiratory specimens during the acute phase of infection. Negative results do not preclude SARS-CoV-2 infection, do not rule out co-infections with other pathogens, and should not be used as the sole basis for treatment or other patient management decisions. Negative results must be combined with clinical observations, patient history, and epidemiological information. The expected result is Negative. Fact Sheet for Patients: SugarRoll.be Fact Sheet for Healthcare Providers: https://www.woods-mathews.com/ This test is not yet approved or cleared  by the [place] FDA and  has been authorized for detection and/or diagnosis of SARS-CoV-2 by FDA under an Emergency Use Authorization (EUA). This EUA will remain  in effect (meaning this test can  be used) for the duration of the COVID-19 declaration under Section 56 4(b)(1) of the Act, 21 U.S.C. section 360bbb-3(b)(1), unless the authorization is terminated or revoked sooner. Performed at [facility], [place] [address] [place], [place] [address]     Radiology No results found.  Assessment/Plan Squamous carcinoma of lung, right (HCC) Apparently doing well from the treatment of this  Essential hypertension blood pressure control important in reducing the progression of atherosclerotic disease. On appropriate oral medications.  Atherosclerosis of native arteries of extremity with intermittent claudication (HCC) ABIs today are 1.05 on the right up from 0.54 and stable on the left and 0.76.  His waveforms are good and his digit pressures are normal.  At this point, his pain is likely more related to arthritis or neuropathy with his perfusion returned to normal.  We will see him back in about 3 months with noninvasive studies.  He will continue his current medical regimen.    Leotis Pain, MD  09/04/2019 10:07 AM    This note was created with Dragon medical transcription system.  Any errors from dictation are purely unintentional

## 2019-11-12 ENCOUNTER — Other Ambulatory Visit: Payer: Self-pay

## 2019-11-12 ENCOUNTER — Ambulatory Visit
Admission: RE | Admit: 2019-11-12 | Discharge: 2019-11-12 | Disposition: A | Discharge status: Home / Self Care | Payer: Medicare Other | Source: Ambulatory Visit | Attending: Oncology | Admitting: Oncology

## 2019-11-12 DIAGNOSIS — C3491 Malignant neoplasm of unspecified part of right bronchus or lung: Secondary | ICD-10-CM

## 2019-11-12 LAB — POCT I-STAT CREATININE: Creatinine, Ser: 1.5 mg/dL — ABNORMAL HIGH (ref 0.61–1.24)

## 2019-11-12 MED ORDER — IOHEXOL 300 MG/ML  SOLN
75.0000 mL | Freq: Once | INTRAMUSCULAR | Status: AC | PRN
  Administered 2019-11-12: 60 mL via INTRAVENOUS

## 2019-11-14 ENCOUNTER — Encounter: Payer: Self-pay | Admitting: Oncology

## 2019-11-14 ENCOUNTER — Other Ambulatory Visit: Payer: Self-pay

## 2019-11-14 ENCOUNTER — Inpatient Hospital Stay: Payer: Medicare Other | Attending: Oncology

## 2019-11-14 ENCOUNTER — Inpatient Hospital Stay (HOSPITAL_BASED_OUTPATIENT_CLINIC_OR_DEPARTMENT_OTHER): Payer: Medicare Other | Admitting: Oncology

## 2019-11-14 VITALS — BP 129/75 | HR 65 | Temp 97.5°F | Resp 20 | Wt 177.5 lb

## 2019-11-14 DIAGNOSIS — K7689 Other specified diseases of liver: Secondary | ICD-10-CM | POA: Diagnosis not present

## 2019-11-14 DIAGNOSIS — I739 Peripheral vascular disease, unspecified: Secondary | ICD-10-CM | POA: Insufficient documentation

## 2019-11-14 DIAGNOSIS — N1831 Chronic kidney disease, stage 3a: Secondary | ICD-10-CM | POA: Diagnosis not present

## 2019-11-14 DIAGNOSIS — C3491 Malignant neoplasm of unspecified part of right bronchus or lung: Secondary | ICD-10-CM

## 2019-11-14 DIAGNOSIS — K449 Diaphragmatic hernia without obstruction or gangrene: Secondary | ICD-10-CM | POA: Diagnosis not present

## 2019-11-14 DIAGNOSIS — Z79899 Other long term (current) drug therapy: Secondary | ICD-10-CM | POA: Diagnosis not present

## 2019-11-14 DIAGNOSIS — I129 Hypertensive chronic kidney disease with stage 1 through stage 4 chronic kidney disease, or unspecified chronic kidney disease: Secondary | ICD-10-CM | POA: Insufficient documentation

## 2019-11-14 DIAGNOSIS — E785 Hyperlipidemia, unspecified: Secondary | ICD-10-CM | POA: Diagnosis not present

## 2019-11-14 DIAGNOSIS — E041 Nontoxic single thyroid nodule: Secondary | ICD-10-CM | POA: Diagnosis not present

## 2019-11-14 DIAGNOSIS — J439 Emphysema, unspecified: Secondary | ICD-10-CM

## 2019-11-14 DIAGNOSIS — Z87891 Personal history of nicotine dependence: Secondary | ICD-10-CM | POA: Diagnosis not present

## 2019-11-14 DIAGNOSIS — Z7982 Long term (current) use of aspirin: Secondary | ICD-10-CM | POA: Insufficient documentation

## 2019-11-14 DIAGNOSIS — N281 Cyst of kidney, acquired: Secondary | ICD-10-CM | POA: Diagnosis not present

## 2019-11-14 LAB — COMPREHENSIVE METABOLIC PANEL
ALT: 22 U/L (ref 0–44)
AST: 19 U/L (ref 15–41)
Albumin: 4 g/dL (ref 3.5–5.0)
Alkaline Phosphatase: 76 U/L (ref 38–126)
Anion gap: 9 (ref 5–15)
BUN: 28 mg/dL — ABNORMAL HIGH (ref 8–23)
CO2: 25 mmol/L (ref 22–32)
Calcium: 9.3 mg/dL (ref 8.9–10.3)
Chloride: 103 mmol/L (ref 98–111)
Creatinine, Ser: 1.58 mg/dL — ABNORMAL HIGH (ref 0.61–1.24)
GFR calc Af Amer: 49 mL/min — ABNORMAL LOW (ref >60)
GFR calc non Af Amer: 42 mL/min — ABNORMAL LOW (ref >60)
Glucose, Bld: 111 mg/dL — ABNORMAL HIGH (ref 70–99)
Potassium: 5.2 mmol/L — ABNORMAL HIGH (ref 3.5–5.1)
Sodium: 137 mmol/L (ref 135–145)
Total Bilirubin: 0.7 mg/dL (ref 0.3–1.2)
Total Protein: 7.5 g/dL (ref 6.5–8.1)

## 2019-11-14 LAB — CBC WITH DIFFERENTIAL/PLATELET
Abs Immature Granulocytes: 0.03 10*3/uL (ref 0.00–0.07)
Basophils Absolute: 0.1 10*3/uL (ref 0.0–0.1)
Basophils Relative: 1 %
Eosinophils Absolute: 0.2 10*3/uL (ref 0.0–0.5)
Eosinophils Relative: 3 %
HCT: 41.6 % (ref 39.0–52.0)
Hemoglobin: 13.6 g/dL (ref 13.0–17.0)
Immature Granulocytes: 0 %
Lymphocytes Relative: 18 %
Lymphs Abs: 1.4 10*3/uL (ref 0.7–4.0)
MCH: 29.3 pg (ref 26.0–34.0)
MCHC: 32.7 g/dL (ref 30.0–36.0)
MCV: 89.7 fL (ref 80.0–100.0)
Monocytes Absolute: 0.8 10*3/uL (ref 0.1–1.0)
Monocytes Relative: 11 %
Neutro Abs: 5.3 10*3/uL (ref 1.7–7.7)
Neutrophils Relative %: 67 %
Platelets: 295 10*3/uL (ref 150–400)
RBC: 4.64 MIL/uL (ref 4.22–5.81)
RDW: 14.3 % (ref 11.5–15.5)
WBC: 7.8 10*3/uL (ref 4.0–10.5)
nRBC: 0 % (ref 0.0–0.2)

## 2019-11-14 NOTE — Progress Notes (Signed)
Hematology/Oncology Follow up note [facility] [phone] Fax:(336) [phone]   Patient Care Team: Adin Hector, MD as PCP - General (Internal Medicine) Telford Nab, RN as Registered Nurse  . REASON FOR VISIT Follow up for treatment of squamous lung cancer, thyroid nodule HISTORY OF PRESENTING ILLNESS:  Marvin Williams. is a  74 y.o.  male follows up for management of squamous lung cancer, stage IIB (cT3,cN0 cM0) His case was discussed on tumor board.  Although he has mild asymmetric FDG activity iin the right hilum with SUV max of 2.9. This corresponds to a densely calcified hilar lymph node, consensus was reached that this is consistent with  old granulomatous disease. He is referred to Dr.Oaks to discuss about surgery.   #ill-defined right inferior lobe nodule in the thyroid, 1.7 cm by 1.1 x 1.1 cm. S/p biopsy which showed benign follicular nodule.  # 07/2017 finished SBRT. -He declines adjuvant chemotherapy. admitted from 10/23/2018- 10/25/2018 due to E. coli sepsis/UTI.    # followed up with vascular surgeon of peripheral vascular disease. On Aspirin. Off plavix due to easy bruising.    INTERVAL HISTORY Marvin Williams. is a 74 y.o. male who has above history reviewed by me present for follow up of stage II B squamous lung cancer.  Patient reports feeling well during the interval. He has a chronic " smoker's cough" in the morning. Patient has no new complaints today. Denies any hemoptysis, unintentional weight loss, fever or chills. . Patient has chronic shortness of breath with exertion. Review of Systems  Constitutional: Negative for chills, fever, malaise/fatigue and weight loss.  HENT: Negative for sore throat.   Eyes: Negative for redness.  Respiratory: Positive for shortness of breath. Negative for cough and wheezing.   Cardiovascular: Negative for chest pain, palpitations and leg swelling.  Gastrointestinal: Negative for  abdominal pain, blood in stool, nausea and vomiting.  Genitourinary: Negative for dysuria.  Musculoskeletal: Negative for myalgias.  Skin: Negative for rash.  Neurological: Negative for dizziness, tingling and tremors.  Endo/Heme/Allergies: Does not bruise/bleed easily.  Psychiatric/Behavioral: Negative for hallucinations.    MEDICAL HISTORY:  Past Medical History:  Diagnosis Date  . Cancer ([place])    skin  . HTN (hypertension)   . Hyperlipemia   . Lung cancer ([place])   . Peripheral arterial disease ([place])     SURGICAL HISTORY: Past Surgical History:  Procedure Laterality Date  . BACK SURGERY    . BACK SURGERY     1971  . COLONOSCOPY    . ESOPHAGOGASTRODUODENOSCOPY    . LOWER EXTREMITY ANGIOGRAPHY Right 05/01/2018   Procedure: LOWER EXTREMITY ANGIOGRAPHY;  Surgeon: Algernon Huxley, MD;  Location: [facility];  Service: Cardiovascular;  Laterality: Right;  . LOWER EXTREMITY ANGIOGRAPHY Left 05/08/2018   Procedure: LOWER EXTREMITY ANGIOGRAPHY;  Surgeon: Algernon Huxley, MD;  Location: [facility];  Service: Cardiovascular;  Laterality: Left;  . LOWER EXTREMITY ANGIOGRAPHY Right 07/30/2019   Procedure: LOWER EXTREMITY ANGIOGRAPHY;  Surgeon: Algernon Huxley, MD;  Location: [facility];  Service: Cardiovascular;  Laterality: Right;    SOCIAL HISTORY: Social History   Socioeconomic History  . Marital status: Married    Spouse name: Not on file  . Number of children: Not on file  . Years of education: Not on file  . Highest education level: Not on file  Occupational History  . Not on file  Tobacco Use  . Smoking status: Former Research scientist (life sciences)  .  Smokeless tobacco: Never Used  . Tobacco comment: Last cigarette on 06/04/17  Vaping Use  . Vaping Use: Never used  Substance and Sexual Activity  . Alcohol use: No  . Drug use: No  . Sexual activity: Not on file  Other Topics Concern  . Not on file  Social History Narrative  . Not on file   Social Determinants of Health    Financial Resource Strain:   . Difficulty of Paying Living Expenses:   Food Insecurity:   . Worried About Charity fundraiser in the Last Year:   . Arboriculturist in the Last Year:   Transportation Needs:   . Film/video editor (Medical):   [name] Lack of Transportation (Non-Medical):   Physical Activity:   . Days of Exercise per Week:   . Minutes of Exercise per Session:   Stress:   . Feeling of Stress :   Social Connections:   . Frequency of Communication with Friends and Family:   . Frequency of Social Gatherings with Friends and Family:   . Attends Religious Services:   . Active Member of Clubs or Organizations:   . Attends Archivist Meetings:   [name] Marital Status:   Intimate Partner Violence:   . Fear of Current or Ex-Partner:   . Emotionally Abused:   [name] Physically Abused:   . Sexually Abused:     FAMILY HISTORY: Family History  Problem Relation Age of Onset  . Cancer Mother   . Cancer Father   . Cancer Sister     ALLERGIES:  is allergic to naproxen.  MEDICATIONS:  Current Outpatient Medications  Medication Sig Dispense Refill  . aspirin 81 MG chewable tablet Chew by mouth daily.    [name] atorvastatin (LIPITOR) 10 MG tablet TAKE 1 TABLET BY MOUTH DAILY 30 tablet 11  . clopidogrel (PLAVIX) 75 MG tablet Take 1 tablet (75 mg total) by mouth daily. 90 tablet 3  . losartan-hydrochlorothiazide (HYZAAR) 100-12.5 MG tablet Take 1 tablet by mouth daily.    . tamsulosin (FLOMAX) 0.4 MG CAPS capsule Take 1 capsule (0.4 mg total) by mouth daily. 90 capsule 1  . Cholecalciferol (VITAMIN D) 125 MCG (5000 UT) CAPS Take 1 capsule by mouth daily. (Patient not taking: Reported on 11/14/2019)    . magnesium 30 MG tablet Take 30 mg by mouth 2 (two) times daily. (Patient not taking: Reported on 11/14/2019)    . thiamine (VITAMIN B-1) 100 MG tablet Take 100 mg by mouth daily. (Patient not taking: Reported on 11/14/2019)    . traMADol (ULTRAM) 50 MG tablet Take 1 tablet (50 mg  total) by mouth every 8 (eight) hours as needed. (Patient not taking: Reported on 11/14/2019) 10 tablet 0  . vitamin B-12 (CYANOCOBALAMIN) 100 MCG tablet Take 100 mcg by mouth daily. (Patient not taking: Reported on 11/14/2019)    . zinc gluconate 50 MG tablet Take 50 mg by mouth daily. (Patient not taking: Reported on 11/14/2019)     No current facility-administered medications for this visit.     PHYSICAL EXAMINATION: ECOG PERFORMANCE STATUS: 1 - Symptomatic but completely ambulatory Vitals:   11/14/19 1024  BP: 129/75  Pulse: 65  Resp: 20  Temp: (!) 97.5 F (36.4 C)  SpO2: 99%   Filed Weights   11/14/19 1024  Weight: 177 lb 8 oz (80.5 kg)    Physical Exam Constitutional:      General: He is not in acute distress.    Appearance: He  is not diaphoretic.  HENT:     Head: Normocephalic and atraumatic.     Nose: Nose normal.     Mouth/Throat:     Pharynx: No oropharyngeal exudate.  Eyes:     General: No scleral icterus.       Left eye: No discharge.     Conjunctiva/sclera: Conjunctivae normal.     Pupils: Pupils are equal, round, and reactive to light.  Neck:     Vascular: No JVD.  Cardiovascular:     Rate and Rhythm: Normal rate and regular rhythm.     Heart sounds: Normal heart sounds. No murmur heard.  No friction rub.  Pulmonary:     Effort: Pulmonary effort is normal. No respiratory distress.     Breath sounds: No rales.     Comments: Decreased breath sound bilaterally Chest:     Chest wall: No tenderness.  Abdominal:     General: Bowel sounds are normal. There is no distension.     Palpations: Abdomen is soft.     Tenderness: There is no abdominal tenderness.  Musculoskeletal:        General: Normal range of motion.     Cervical back: Normal range of motion and neck supple.  Lymphadenopathy:     Cervical: No cervical adenopathy.  Skin:    General: Skin is warm and dry.     Findings: No erythema.  Neurological:     Mental Status: He is alert and oriented  to person, place, and time.     Cranial Nerves: No cranial nerve deficit.     Motor: No abnormal muscle tone.     Coordination: Coordination normal.  Psychiatric:        Mood and Affect: Affect normal.      LABORATORY DATA:  I have reviewed the data as listed Lab Results  Component Value Date   WBC 7.8 11/14/2019   HGB 13.6 11/14/2019   HCT 41.6 11/14/2019   MCV 89.7 11/14/2019   PLT 295 11/14/2019   Recent Labs    04/11/19 0904 04/11/19 0904 07/17/19 1258 11/12/19 1005 11/14/19 0954  NA 136  --  137  --  137  K 4.1  --  4.0  --  5.2*  CL 107  --  104  --  103  CO2 23  --  23  --  25  GLUCOSE 116*  --  87  --  111*  BUN 23  --  23  --  28*  CREATININE 1.42*   < > 1.38* 1.50* 1.58*  CALCIUM 9.1  --  9.1  --  9.3  GFRNONAA 48*  --  50*  --  42*  GFRAA 56*  --  58*  --  49*  PROT 7.3  --  7.3  --  7.5  ALBUMIN 3.9  --  3.9  --  4.0  AST 22  --  18  --  19  ALT 27  --  26  --  22  ALKPHOS 64  --  72  --  76  BILITOT 0.5  --  0.4  --  0.7   < > = values in this interval not displayed.   RADIOGRAPHIC STUDIES: I have personally reviewed the radiological images as listed and agreed with the findings in the report. 07/06/2018 CT chest with contrast Showed continued reduction in right lower lobe lung mass.  Continue evaluation of surrounding radiation changes.  No evidence of metastatic disease in the chest. CT  CHEST W CONTRAST  Result Date: 11/12/2019 CLINICAL DATA:  Stage IIB right lower lobe squamous cell lung cancer status post SBRT completed February 2019. Restaging. EXAM: CT CHEST WITH CONTRAST TECHNIQUE: Multidetector CT imaging of the chest was performed during intravenous contrast administration. CONTRAST:  23m OMNIPAQUE IOHEXOL 300 MG/ML  SOLN COMPARISON:  07/13/2019 chest CT. FINDINGS: Cardiovascular: Normal heart size. No significant pericardial effusion/thickening. Left anterior descending and left circumflex coronary atherosclerosis. Atherosclerotic nonaneurysmal  thoracic aorta. Normal caliber pulmonary arteries. No central pulmonary emboli. Mediastinum/Nodes: No discrete thyroid nodules. Unremarkable esophagus. No axillary adenopathy. Coarsely calcified right subcarinal and bilateral hilar nodes are unchanged and compatible with prior granulomatous disease. No pathologically enlarged mediastinal or hilar nodes. Lungs/Pleura: No pneumothorax. No significant pleural effusions. Moderate centrilobular and paraseptal emphysema with mild diffuse bronchial wall thickening. Sharply marginated right lower lobe consolidation with associated bronchiectasis, volume loss and distortion, unchanged, compatible with radiation fibrosis. Stable 0.2 cm peripheral left upper lobe solid pulmonary nodule (series 3/image 35). No acute consolidative airspace disease or new significant pulmonary nodules. Stable calcified bilateral lower lobe subcentimeter granulomas. Upper abdomen: Small hiatal hernia. Simple 1.0 cm central liver cyst. Additional scattered subcentimeter hypodense left liver lesions are too small to characterize and are unchanged, considered benign. Stable minimally complex 3.6 cm renal cyst in lateral interpolar left kidney with thin internal septation (series 2/image 172). Simple right renal cysts, largest 5.5 cm in the posterior lower right kidney. Musculoskeletal: No aggressive appearing focal osseous lesions. Moderate thoracic spondylosis. IMPRESSION: 1. 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 2. No evidence of metastatic disease in the chest. 3. Aortic Atherosclerosis (ICD10-I70.0) and Emphysema (ICD10-J43.9). Electronically Signed   By: JIlona SorrelM.D.   On: 11/12/2019 11:09   VAS UK[place]ABI WITH/WO TBI  Result Date: 09/04/2019 LOWER EXTREMITY DOPPLER STUDY Indications: Rest pain, and peripheral artery disease.  Vascular Interventions: 07/30/2019: Aortogram and Selective Right Lower                         Extremity  Angiogram. PTA of the Left External Iliac                         Artery. Mechanical Thrombectomy with the roto-Rx                         Thrombectomy device to the Right SFA and Popliteal                         Artery. Viabahn Stent x2 to the Right SFA and Popliteal                         Artery. PTA of the Right Posterior Tibial Artery. Comparison Study: 07/25/2019 Performing Technologist: SAlmira CoasterRVS  Examination Guidelines: A complete evaluation includes at minimum, Doppler waveform signals and systolic blood pressure reading at the level of bilateral brachial, anterior tibial, and posterior tibial arteries, when vessel segments are accessible. Bilateral testing is considered an integral part of a complete examination. Photoelectric Plethysmograph (PPG) waveforms and toe systolic pressure readings are included as required and additional duplex testing as needed. Limited examinations for reoccurring indications may be performed as noted.  ABI Findings: +---------+------------------+-----+---------+--------+ Right    Rt Pressure (mmHg)IndexWaveform Comment  +---------+------------------+-----+---------+--------+ Brachial 132                                      +---------+------------------+-----+---------+--------+  ATA      126               0.95 biphasic          +---------+------------------+-----+---------+--------+ PTA      138               1.05 triphasic         +---------+------------------+-----+---------+--------+ Great Toe129               0.98 Normal            +---------+------------------+-----+---------+--------+ +---------+------------------+-----+--------+-------+ Left     Lt Pressure (mmHg)IndexWaveformComment +---------+------------------+-----+--------+-------+ Brachial 125                                    +---------+------------------+-----+--------+-------+ ATA      95                0.72 biphasic         +---------+------------------+-----+--------+-------+ PTA      100               0.76 biphasic        +---------+------------------+-----+--------+-------+ Great Toe109               0.83 Normal          +---------+------------------+-----+--------+-------+ +-------+-----------+-----------+------------+------------+ ABI/TBIToday's ABIToday's TBIPrevious ABIPrevious TBI +-------+-----------+-----------+------------+------------+ Right  1.05       .98        .54         .56          +-------+-----------+-----------+------------+------------+ Left   .76        .83        .83         .51          +-------+-----------+-----------+------------+------------+ Right ABIs appear increased compared to prior study on 07/25/2019. Bilateral TBIs appear increased compared to prior study on 07/25/2019. Left ABIs appear decreased compared to prior study on 07/25/2019.  Summary: Right: Resting right ankle-brachial index indicates moderate right lower extremity arterial disease. The right toe-brachial index is normal. Left: Resting left ankle-brachial index is within normal range. No evidence of significant left lower extremity arterial disease. The left toe-brachial index is normal.  *See table(s) above for measurements and observations.  Electronically signed by Leotis Pain MD on 09/04/2019 at 5:09:31 PM.    Final     ASSESSMENT & PLAN:  Cancer Staging Squamous carcinoma of lung, right ([place]) Staging form: Lung, AJCC 8th Edition - Clinical stage from 06/16/2017: Stage IIB (cT3, cN0, cM0) - Signed by Earlie Server, MD on 06/16/2017  1. Squamous carcinoma of lung, right ([place])   2. Pulmonary emphysema, unspecified emphysema type ([place])   3. Stage 3a chronic kidney disease    # Stage IIB lung squamous cancer s/p RT, finished at the end of February 2019. CT chest without contrast was independently reviewed by me and discussed with patient. No recurrence of metastatic disease in the thorax Recommend  continue CT surveillance in 6 months.  #CKD, multiple myeloma panel is negative for monoclonal protein. Light chain ratio is normal. Elevated free light chain levels are anticipated in CKD. I recommend patient to discuss with primary care provider. Avoid nephrotoxins.   #COPD/emphysema. He also has radiographic findings of interstitial lung disease. Continue follow-up with Dr. Celesta Aver.. The patient knows to call the clinic with any problems questions or concerns. Orders Placed This Encounter  Procedures  .  CT Chest Wo Contrast    Standing Status:   Future    Standing Expiration Date:   11/13/2020    Order Specific Question:   ** REASON FOR EXAM (FREE TEXT)    Answer:   lung cancer follow up    Order Specific Question:   Preferred imaging location?    Answer:   [facility]    Order Specific Question:   Radiology Contrast Protocol - do NOT remove file path    Answer:   [email or web address]  . CBC with Differential/Platelet    Standing Status:   Future    Standing Expiration Date:   11/13/2020  . Comprehensive metabolic panel    Standing Status:   Future    Standing Expiration Date:   11/13/2020    Return of visit: 6 months for reevaluation and discuss CT scanning   Earlie Server, MD, PhD Hematology Oncology [facility] Pager- [phone] 11/14/2019

## 2019-11-14 NOTE — Progress Notes (Signed)
Patient denied any problems or concerns at the moment. 

## 2019-12-04 ENCOUNTER — Ambulatory Visit (INDEPENDENT_AMBULATORY_CARE_PROVIDER_SITE_OTHER): Payer: Medicare Other | Admitting: Nurse Practitioner

## 2019-12-04 ENCOUNTER — Encounter (INDEPENDENT_AMBULATORY_CARE_PROVIDER_SITE_OTHER): Payer: Medicare Other

## 2020-01-14 ENCOUNTER — Encounter: Payer: Self-pay | Admitting: Radiation Oncology

## 2020-01-14 ENCOUNTER — Ambulatory Visit
Admission: RE | Admit: 2020-01-14 | Discharge: 2020-01-14 | Disposition: A | Discharge status: Home / Self Care | Payer: Medicare Other | Source: Ambulatory Visit | Attending: Radiation Oncology | Admitting: Radiation Oncology

## 2020-01-14 ENCOUNTER — Other Ambulatory Visit: Payer: Self-pay

## 2020-01-14 VITALS — BP 134/81 | HR 84 | Temp 95.4°F | Wt 177.0 lb

## 2020-01-14 DIAGNOSIS — J439 Emphysema, unspecified: Secondary | ICD-10-CM | POA: Diagnosis not present

## 2020-01-14 DIAGNOSIS — N281 Cyst of kidney, acquired: Secondary | ICD-10-CM | POA: Insufficient documentation

## 2020-01-14 DIAGNOSIS — R109 Unspecified abdominal pain: Secondary | ICD-10-CM | POA: Diagnosis not present

## 2020-01-14 DIAGNOSIS — Z85118 Personal history of other malignant neoplasm of bronchus and lung: Secondary | ICD-10-CM | POA: Insufficient documentation

## 2020-01-14 DIAGNOSIS — C3491 Malignant neoplasm of unspecified part of right bronchus or lung: Secondary | ICD-10-CM

## 2020-01-14 DIAGNOSIS — R0602 Shortness of breath: Secondary | ICD-10-CM | POA: Insufficient documentation

## 2020-01-14 DIAGNOSIS — N1831 Chronic kidney disease, stage 3a: Secondary | ICD-10-CM | POA: Insufficient documentation

## 2020-01-14 DIAGNOSIS — Z923 Personal history of irradiation: Secondary | ICD-10-CM | POA: Insufficient documentation

## 2020-01-14 NOTE — Progress Notes (Signed)
Radiation Oncology Follow up Note  Name: Marvin Williams.   Date:   01/14/2020 MRN:  [id number] DOB: 09-23-1945    This 74 y.o. male presents to the clinic today for 2 and half year follow-up status post  radiation therapy for stage IIb (T3 N0 M0) squamous cell carcinoma the right lower lobe.  REFERRING PROVIDER: Derinda Late, MD  HPI: Patient is a 74 year old male now out 2-1/2 years having completed radiation therapy to his right lower lobe for stage IIb squamous cell carcinoma. Patient also has stage IIIa chronic renal disease. He is seen today in routine follow-up doing fairly well he states his breathing pattern is limited he has dyspnea on exertion. He has COPD emphysema which predates his treatment. He also some right flank pain of unknown etiology. His most recent CT scan. Back in June shows stable radiation fibrosis in the right lower lobe no evidence of local tumor recurrence.  COMPLICATIONS OF TREATMENT: none  FOLLOW UP COMPLIANCE: keeps appointments   PHYSICAL EXAM:  BP 134/81 (BP Location: Left Arm, Patient Position: Sitting, Cuff Size: Normal)   Pulse 84   Temp (!) 95.4 F (35.2 C) (Tympanic)   Wt 177 lb (80.3 kg)   SpO2 99%   BMI 26.91 kg/m  Well-developed well-nourished patient in NAD. HEENT reveals PERLA, EOMI, discs not visualized.  Oral cavity is clear. No oral mucosal lesions are identified. Neck is clear without evidence of cervical or supraclavicular adenopathy. Lungs are clear to A&P. Cardiac examination is essentially unremarkable with regular rate and rhythm without murmur rub or thrill. Abdomen is benign with no organomegaly or masses noted. Motor sensory and DTR levels are equal and symmetric in the upper and lower extremities. Cranial nerves II through XII are grossly intact. Proprioception is intact. No peripheral adenopathy or edema is identified. No motor or sensory levels are noted. Crude visual fields are within normal range.  RADIOLOGY RESULTS: CT  scans reviewed and compared to prior studies  PLAN: Present time patient is doing well no evidence of disease 2-1/2 years out from radiation therapy to his right lower lobe. The pain in his right flank is of unknown etiology he does have some renal cysts some hepatic cysts as well as fibrosis of his lung. He has COPD emphysema which definitely explain some of his dyspnea on exertion. Otherwise pleased with his overall progress. Of asked to see him back in 1 year for follow-up. Patient knows to call with any concerns.  I would like to take this opportunity to thank you for allowing me to participate in the care of your patient.Noreene Filbert, MD

## 2020-04-08 ENCOUNTER — Other Ambulatory Visit (INDEPENDENT_AMBULATORY_CARE_PROVIDER_SITE_OTHER): Payer: Self-pay | Admitting: Nurse Practitioner

## 2020-04-18 ENCOUNTER — Other Ambulatory Visit (INDEPENDENT_AMBULATORY_CARE_PROVIDER_SITE_OTHER): Payer: Self-pay | Admitting: Nurse Practitioner

## 2020-05-05 ENCOUNTER — Encounter: Payer: Self-pay | Admitting: Radiation Oncology

## 2020-05-05 ENCOUNTER — Ambulatory Visit
Admission: RE | Admit: 2020-05-05 | Discharge: 2020-05-05 | Disposition: A | Discharge status: Home / Self Care | Payer: Medicare Other | Source: Ambulatory Visit | Attending: Radiation Oncology | Admitting: Radiation Oncology

## 2020-05-05 VITALS — BP 166/98 | HR 75 | Temp 96.4°F | Resp 22 | Wt 179.6 lb

## 2020-05-05 DIAGNOSIS — C3491 Malignant neoplasm of unspecified part of right bronchus or lung: Secondary | ICD-10-CM

## 2020-05-05 DIAGNOSIS — Z85118 Personal history of other malignant neoplasm of bronchus and lung: Secondary | ICD-10-CM | POA: Insufficient documentation

## 2020-05-05 DIAGNOSIS — Z923 Personal history of irradiation: Secondary | ICD-10-CM | POA: Diagnosis not present

## 2020-05-05 NOTE — Progress Notes (Signed)
Patient asked to be seen today for pain in right back that comes around to front under ribs.   This has been ongoing for about 2-3 weeks.   It is worse when he lies on back or side.

## 2020-05-05 NOTE — Progress Notes (Signed)
Radiation Oncology Follow up Note  Name: Marvin Williams.   Date:   05/05/2020 MRN:  [id number] DOB: 1945-09-13    This 74 y.o. male presents to the clinic today for 3-year follow-up status post radiation therapy for stage IIb (T3 N0 M0) squamous cell carcinoma the right lower lobe.  REFERRING PROVIDER: Adin Hector, MD  HPI: Patient is a 74 year old male previously treated close to 3 years prior for stage IIb squamous cell carcinoma the right lower lobe.  His last CT scan.  Back in June showed stable radiation fibrosis in the right lower lobe with no evidence of local recurrent disease.  There was no evidence of metastatic disease.  He is self-referred today for atypical chest pain really in the right lower lobe.  He is try to make appointments with his other physicians although I was the first doctor that would see him.  He specifically [place] cough hemoptysis or chest tightness.  Patient is scheduled for another CT scan in about 2 weeks.  COMPLICATIONS OF TREATMENT: none  FOLLOW UP COMPLIANCE: keeps appointments   PHYSICAL EXAM:  BP (!) 166/98 (BP Location: Left Arm, Patient Position: Sitting)   Pulse 75   Temp (!) 96.4 F (35.8 C) (Tympanic)   Resp (!) 22   Wt 179 lb 9.6 oz (81.5 kg)   BMI 27.31 kg/m  Well-developed well-nourished patient in NAD. HEENT reveals PERLA, EOMI, discs not visualized.  Oral cavity is clear. No oral mucosal lesions are identified. Neck is clear without evidence of cervical or supraclavicular adenopathy. Lungs are clear to A&P. Cardiac examination is essentially unremarkable with regular rate and rhythm without murmur rub or thrill. Abdomen is benign with no organomegaly or masses noted. Motor sensory and DTR levels are equal and symmetric in the upper and lower extremities. Cranial nerves II through XII are grossly intact. Proprioception is intact. No peripheral adenopathy or edema is identified. No motor or sensory levels are noted. Crude visual fields are  within normal range.  RADIOLOGY RESULTS: Previous CT scans reviewed compatible with above-stated findings  PLAN: Present time patient needs work-up from his PMD.  I will review his CT scans of his chest when he has those the next 2 weeks and see him shortly after.  Do not believe this is related to his lung cancer.  He does not seem in any specific distress at this time.  I have referred him to back to his PMD and to medical oncology.  Patient knows to call with any concerns.  I would like to take this opportunity to thank you for allowing me to participate in the care of your patient.Noreene Filbert, MD

## 2020-05-08 IMAGING — CT CT CHEST W/O CM
2 of 4 series · 15 of 36 positions shown, 18 images · non-contrast
Comparison: 04/09/2019

CLINICAL DATA: Restaging Lung cancer right-sided chest pain

EXAM:
CT CHEST WITHOUT CONTRAST
TECHNIQUE: Multidetector CT imaging of the chest was performed following the
standard protocol without IV contrast.

[Series 2: chest 2.00 · axial · 0.73mm/px · z∈[-1214,-930]mm · 12 of 168 slices shown, 15 images]
[im 13/168  mediastinal]
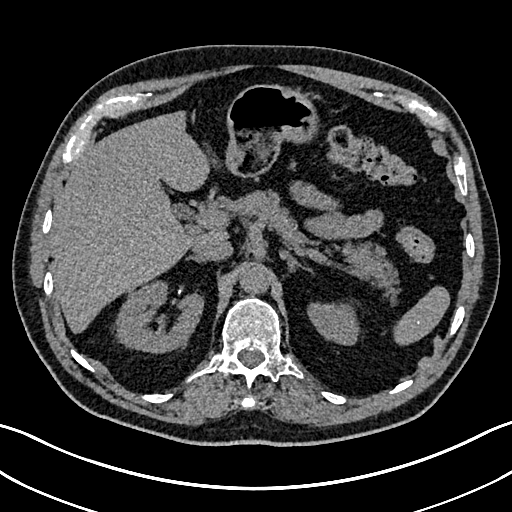
[im 13/168  lung]
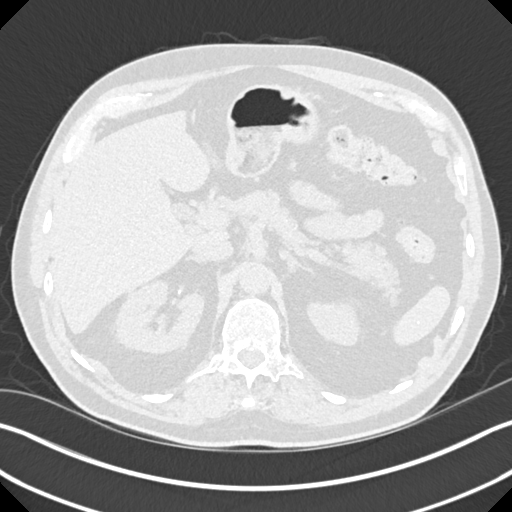
[im 26/168  lung]
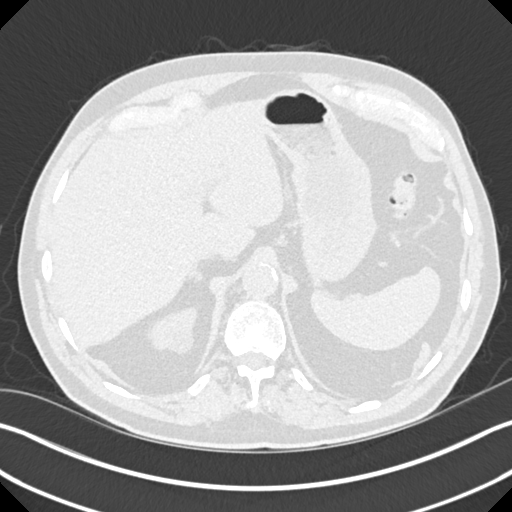
[im 39/168  lung]
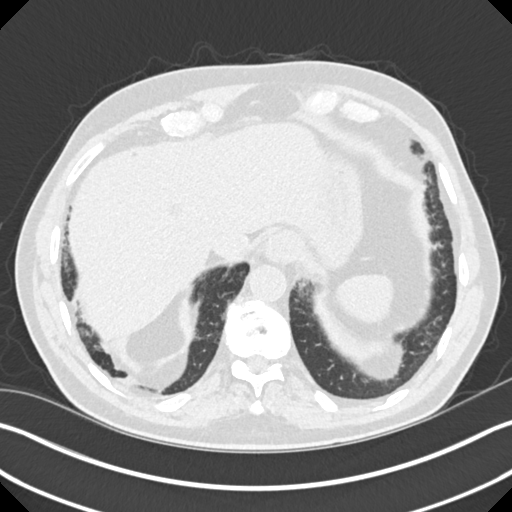
[im 52/168  lung]
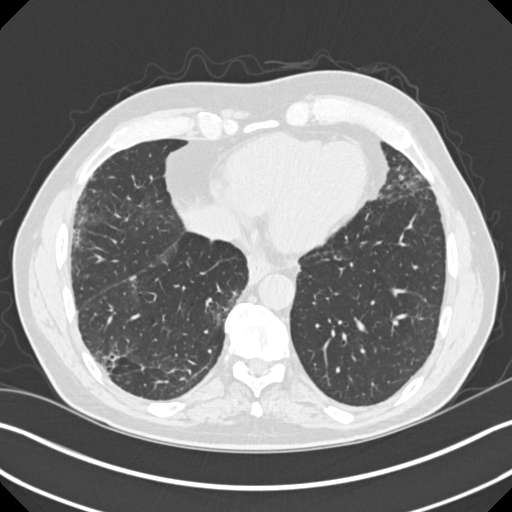
[im 65/168  mediastinal]
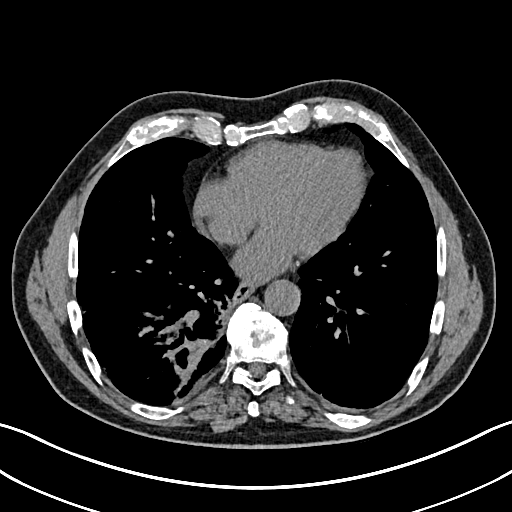
[im 65/168  lung]
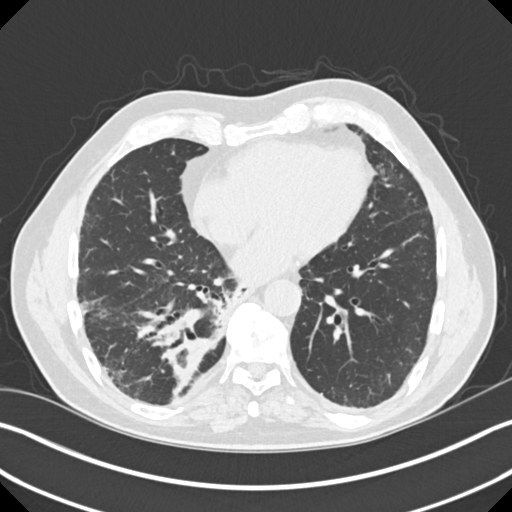
[im 78/168  lung]
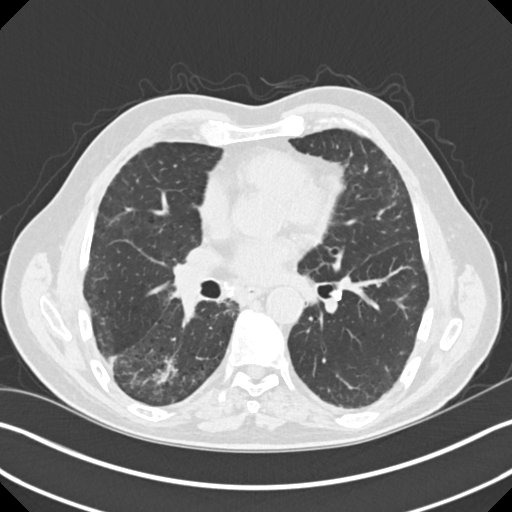
[im 90/168  lung]
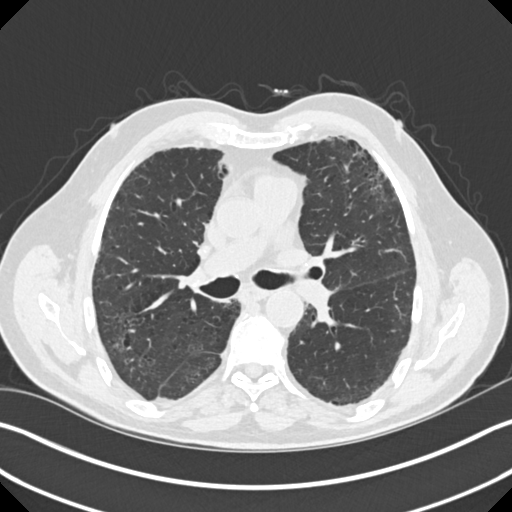
[im 103/168  lung]
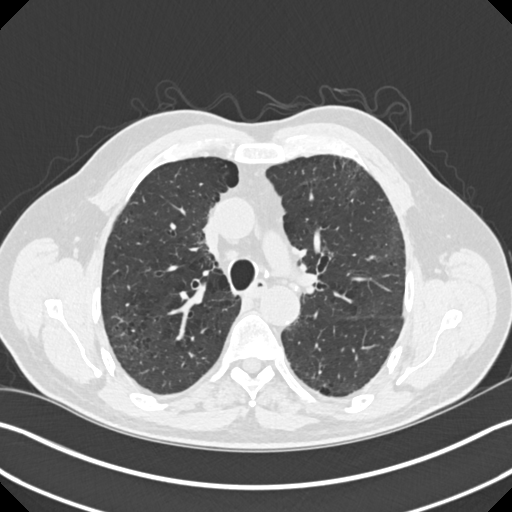
[im 116/168  mediastinal]
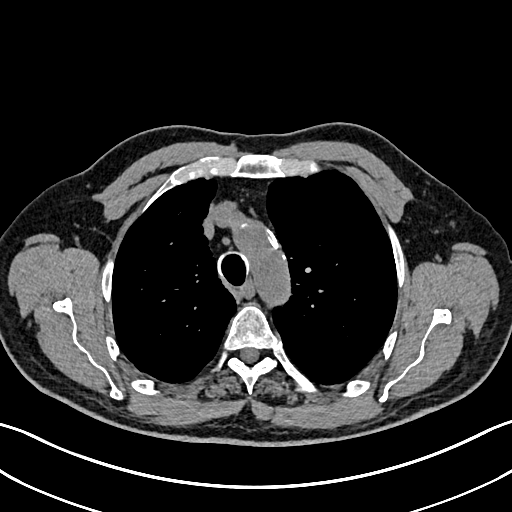
[im 116/168  lung]
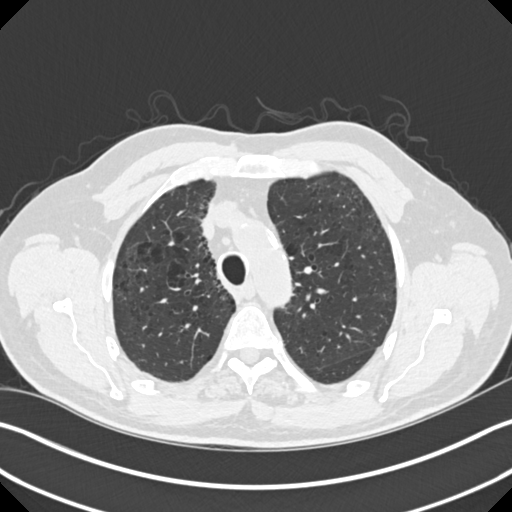
[im 129/168  lung]
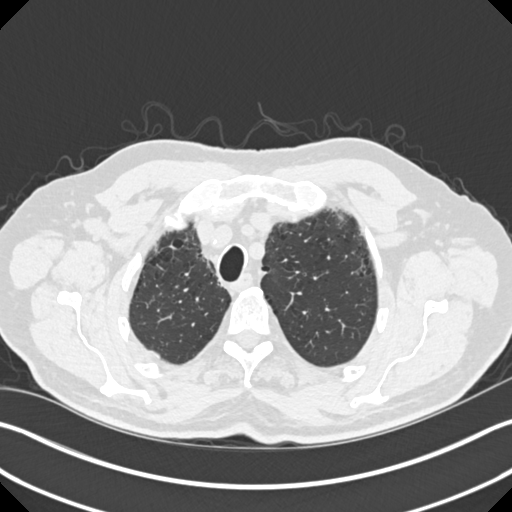
[im 142/168  lung]
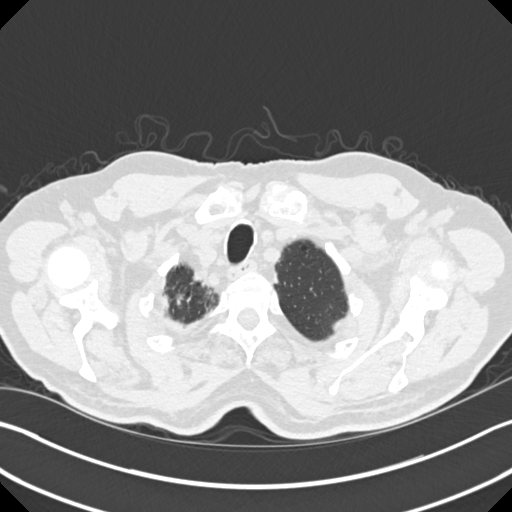
[im 155/168  lung]
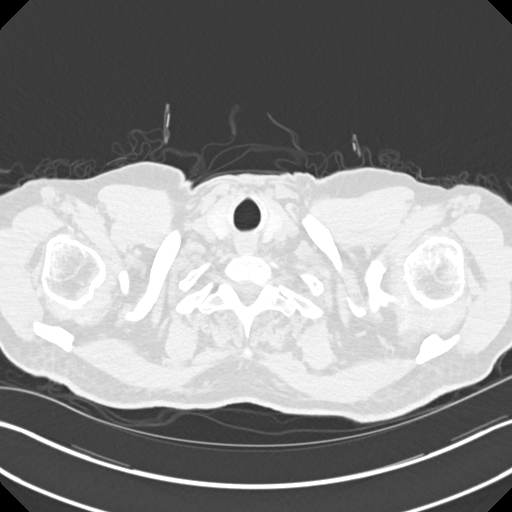

[Series 5: coronals chest 2.00 cor · coronal · 0.66mm/px · 3 of 168 slices shown]
[im 34/168  lung]
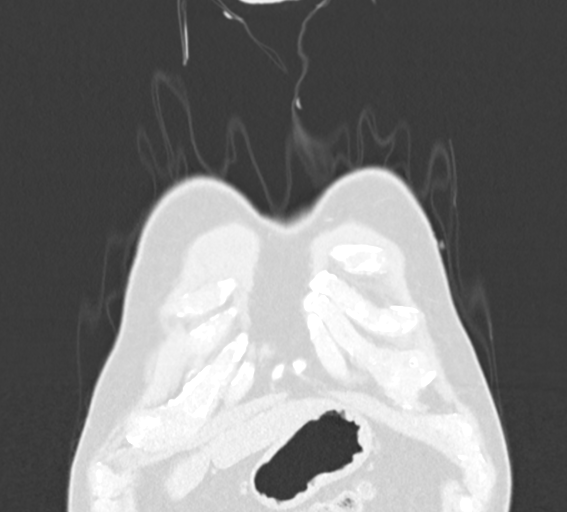
[im 67/168  lung]
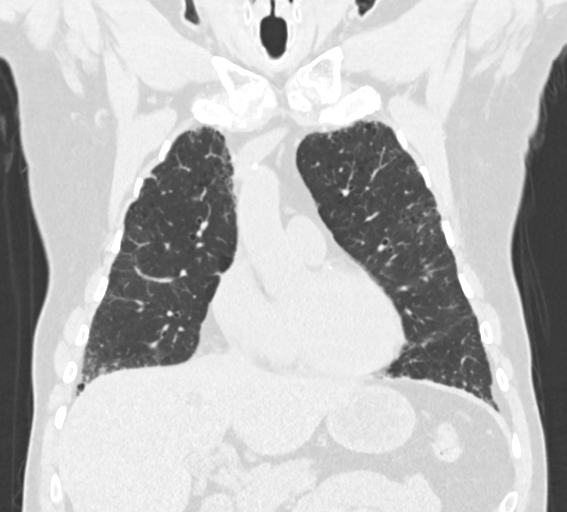
[im 101/168  lung]
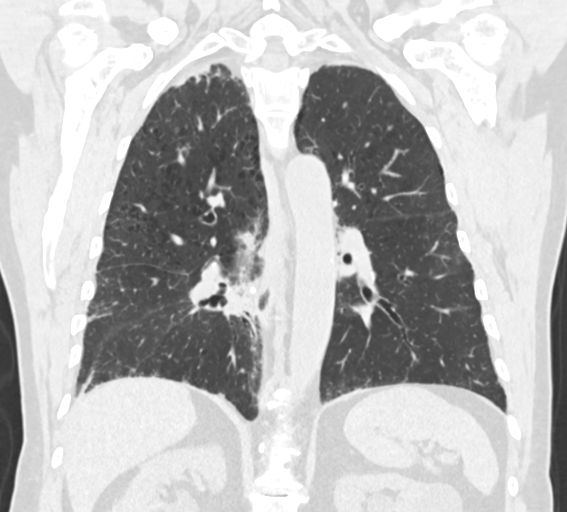

[15 of 36 positions shown; findings below may reference images not displayed]

FINDINGS: Cardiovascular: Normal heart size. Aortic atherosclerosis. Lad, left
circumflex coronary artery calcifications.

Mediastinum/Nodes: Normal appearance of the thyroid gland. The
trachea appears patent and is midline. Normal appearance of the
esophagus. Calcified mediastinal and hilar lymph nodes are again
noted compatible with chronic granulomatous disease. No axillary,
supraclavicular, mediastinal or hilar adenopathy.

Lungs/Pleura: No pleural effusion identified. Centrilobular and
paraseptal emphysema. Patchy peripheral areas of ground-glass
attenuation,, cylindrical bronchiectasis and interstitial
reticulation identified bilaterally. As mentioned previously
findings are suggestive of underlying interstitial lung disease.

Right lower lobe masslike area of architectural distortion and
fibrosis involving the superior segment of the right lower lobe is
again noted and appears stable compatible with changes due to
external beam radiation. Calcified granulomas are again noted.
Unchanged 3 mm nodule within the lateral left upper lobe, image
38/3. No new or enlarging pulmonary nodule or mass identified.

Upper Abdomen: Unchanged low-attenuation structure within the liver
measuring 1 cm, image 131/2. Bilateral kidney cysts are again noted.
These are incompletely characterized without IV contrast.

Musculoskeletal: No chest wall mass or suspicious bone lesions
identified.
IMPRESSION: 1. Stable post treatment changes with masslike architectural
distortion and fibrosis within the right lower lobe. No specific
findings to indicate local recurrence or metastatic disease within
the chest.
2. Emphysema
3. Evidence of interstitial lung disease such as usual interstitial
pneumonia (UIP) versus nonspecific interstitial pneumonia (NSIP).
4. Aortic atherosclerosis with coronary artery calcifications
5. Aortic Atherosclerosis (K8GI0-5QC.C) and Emphysema (K8GI0-PY3.Z).

## 2020-05-15 ENCOUNTER — Other Ambulatory Visit: Payer: Self-pay

## 2020-05-15 ENCOUNTER — Ambulatory Visit
Admission: RE | Admit: 2020-05-15 | Discharge: 2020-05-15 | Disposition: A | Discharge status: Home / Self Care | Payer: Medicare Other | Source: Ambulatory Visit | Attending: Oncology | Admitting: Oncology

## 2020-05-15 DIAGNOSIS — C3491 Malignant neoplasm of unspecified part of right bronchus or lung: Secondary | ICD-10-CM | POA: Diagnosis present

## 2020-05-16 ENCOUNTER — Other Ambulatory Visit: Payer: Self-pay | Admitting: *Deleted

## 2020-05-16 ENCOUNTER — Ambulatory Visit
Admission: RE | Admit: 2020-05-16 | Discharge: 2020-05-16 | Disposition: A | Discharge status: Home / Self Care | Payer: Medicare Other | Source: Ambulatory Visit | Attending: Radiation Oncology | Admitting: Radiation Oncology

## 2020-05-16 ENCOUNTER — Encounter: Payer: Self-pay | Admitting: Radiation Oncology

## 2020-05-16 ENCOUNTER — Encounter: Payer: Self-pay | Admitting: Oncology

## 2020-05-16 ENCOUNTER — Inpatient Hospital Stay (HOSPITAL_BASED_OUTPATIENT_CLINIC_OR_DEPARTMENT_OTHER): Payer: Medicare Other | Admitting: Oncology

## 2020-05-16 ENCOUNTER — Inpatient Hospital Stay: Payer: Medicare Other | Attending: Oncology

## 2020-05-16 VITALS — BP 148/93 | HR 77 | Temp 96.3°F | Wt 176.0 lb

## 2020-05-16 DIAGNOSIS — R0789 Other chest pain: Secondary | ICD-10-CM

## 2020-05-16 DIAGNOSIS — C3431 Malignant neoplasm of lower lobe, right bronchus or lung: Secondary | ICD-10-CM

## 2020-05-16 DIAGNOSIS — Z79899 Other long term (current) drug therapy: Secondary | ICD-10-CM | POA: Diagnosis not present

## 2020-05-16 DIAGNOSIS — I739 Peripheral vascular disease, unspecified: Secondary | ICD-10-CM | POA: Diagnosis not present

## 2020-05-16 DIAGNOSIS — E785 Hyperlipidemia, unspecified: Secondary | ICD-10-CM | POA: Diagnosis not present

## 2020-05-16 DIAGNOSIS — J439 Emphysema, unspecified: Secondary | ICD-10-CM | POA: Diagnosis not present

## 2020-05-16 DIAGNOSIS — E875 Hyperkalemia: Secondary | ICD-10-CM

## 2020-05-16 DIAGNOSIS — C3491 Malignant neoplasm of unspecified part of right bronchus or lung: Secondary | ICD-10-CM

## 2020-05-16 DIAGNOSIS — I129 Hypertensive chronic kidney disease with stage 1 through stage 4 chronic kidney disease, or unspecified chronic kidney disease: Secondary | ICD-10-CM | POA: Insufficient documentation

## 2020-05-16 DIAGNOSIS — E041 Nontoxic single thyroid nodule: Secondary | ICD-10-CM | POA: Insufficient documentation

## 2020-05-16 DIAGNOSIS — N1831 Chronic kidney disease, stage 3a: Secondary | ICD-10-CM | POA: Insufficient documentation

## 2020-05-16 DIAGNOSIS — C3490 Malignant neoplasm of unspecified part of unspecified bronchus or lung: Secondary | ICD-10-CM

## 2020-05-16 DIAGNOSIS — Z87891 Personal history of nicotine dependence: Secondary | ICD-10-CM | POA: Insufficient documentation

## 2020-05-16 DIAGNOSIS — N281 Cyst of kidney, acquired: Secondary | ICD-10-CM | POA: Insufficient documentation

## 2020-05-16 LAB — CBC WITH DIFFERENTIAL/PLATELET
Abs Immature Granulocytes: 0.03 10*3/uL (ref 0.00–0.07)
Basophils Absolute: 0.1 10*3/uL (ref 0.0–0.1)
Basophils Relative: 1 %
Eosinophils Absolute: 0.2 10*3/uL (ref 0.0–0.5)
Eosinophils Relative: 3 %
HCT: 43.9 % (ref 39.0–52.0)
Hemoglobin: 14.7 g/dL (ref 13.0–17.0)
Immature Granulocytes: 0 %
Lymphocytes Relative: 20 %
Lymphs Abs: 1.6 10*3/uL (ref 0.7–4.0)
MCH: 29.5 pg (ref 26.0–34.0)
MCHC: 33.5 g/dL (ref 30.0–36.0)
MCV: 88 fL (ref 80.0–100.0)
Monocytes Absolute: 1 10*3/uL (ref 0.1–1.0)
Monocytes Relative: 12 %
Neutro Abs: 5.1 10*3/uL (ref 1.7–7.7)
Neutrophils Relative %: 64 %
Platelets: 313 10*3/uL (ref 150–400)
RBC: 4.99 MIL/uL (ref 4.22–5.81)
RDW: 13.7 % (ref 11.5–15.5)
WBC: 7.9 10*3/uL (ref 4.0–10.5)
nRBC: 0 % (ref 0.0–0.2)

## 2020-05-16 LAB — COMPREHENSIVE METABOLIC PANEL
ALT: 22 U/L (ref 0–44)
AST: 20 U/L (ref 15–41)
Albumin: 4.1 g/dL (ref 3.5–5.0)
Alkaline Phosphatase: 82 U/L (ref 38–126)
Anion gap: 9 (ref 5–15)
BUN: 19 mg/dL (ref 8–23)
CO2: 25 mmol/L (ref 22–32)
Calcium: 9.6 mg/dL (ref 8.9–10.3)
Chloride: 103 mmol/L (ref 98–111)
Creatinine, Ser: 1.33 mg/dL — ABNORMAL HIGH (ref 0.61–1.24)
GFR, Estimated: 55 mL/min — ABNORMAL LOW (ref >60)
Glucose, Bld: 103 mg/dL — ABNORMAL HIGH (ref 70–99)
Potassium: 5.3 mmol/L — ABNORMAL HIGH (ref 3.5–5.1)
Sodium: 137 mmol/L (ref 135–145)
Total Bilirubin: 0.5 mg/dL (ref 0.3–1.2)
Total Protein: 7.7 g/dL (ref 6.5–8.1)

## 2020-05-16 NOTE — Progress Notes (Signed)
Radiation Oncology Follow up Note  Name: Marvin Williams.   Date:   05/16/2020 MRN:  [id number] DOB: [birth year]    This 74 y.o. 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  He has been doing good although recently is complaining of right chest pain.  He self-referred and I ordered a CT scan of the chest.  Which showed posttreatment fibrosis in the perihilar right lower lobe which was more confluent today than on previous exams.  PET/CT was recommended.  No metastatic disease was noted in the chest.  He specifically denies cough hemoptysis or chest tightness.  He has seen his PMD and has been started on  gabapentin.  Patient already had tramadol for pain.  COMPLICATIONS OF TREATMENT: none  FOLLOW UP COMPLIANCE: keeps appointments   PHYSICAL EXAM:  BP (!) 148/93 (BP Location: Left Arm, Patient Position: Sitting, Cuff Size: Normal)   Pulse 77   Temp (!) 96.3 F (35.7 C) (Tympanic)   Wt 176 lb (79.8 kg)   BMI 26.76 kg/m  Well-developed well-nourished patient in NAD. HEENT reveals PERLA, EOMI, discs not visualized.  Oral cavity is clear. No oral mucosal lesions are identified. Neck is clear without evidence of cervical or supraclavicular adenopathy. Lungs are clear to A&P. Cardiac examination is essentially unremarkable with regular rate and rhythm without murmur rub or thrill. Abdomen is benign with no organomegaly or masses noted. Motor sensory and DTR levels are equal and symmetric in the upper and lower extremities. Cranial nerves II through XII are grossly intact. Proprioception is intact. No peripheral  adenopathy or edema is identified. No motor or sensory levels are noted. Crude visual fields are within normal range.  RADIOLOGY RESULTS: CT scans results reviewed PET CT scan ordered  PLAN: Present time would like to see a PET CT scan to rule out possibility of any recurrent disease.  I will see him in follow-up shortly after his PET CT scan.  Should he have evidence recurrent disease will refer back to medical oncology for evaluation.  On my review of his CT scan he does have significant degenerative changes of his spine which certainly may be causing some of the back pain.  If PET scan is normal will refer him to neurosurgical team for evaluation.  I would like to take this opportunity to thank you for allowing me to participate in the care of your patient.Noreene Filbert, MD

## 2020-05-16 NOTE — Progress Notes (Signed)
Hematology/Oncology Follow up note [employer] [phone] [phone]   Patient Care Team: Adin Hector, MD as PCP - General (Internal Medicine) Telford Nab, RN as Registered Nurse  . REASON FOR VISIT Follow up for treatment of squamous lung cancer, thyroid nodule HISTORY OF PRESENTING ILLNESS:  Marvin Heist. is a  74 y.o.  male follows up for management of squamous lung cancer, stage IIB (cT3,cN0 cM0) His case was discussed on tumor board.  Although he has mild asymmetric FDG activity iin the right hilum with SUV max of 2.9. This corresponds to a densely calcified hilar lymph node, consensus was reached that this is consistent with  old granulomatous disease. He is referred to Dr.Oaks to discuss about surgery.   #ill-defined right inferior lobe nodule in the thyroid, 1.7 cm by 1.1 x 1.1 cm. S/p biopsy which showed benign follicular nodule.  # 07/2017 finished SBRT. -He declines adjuvant chemotherapy. admitted from 10/23/2018- 10/25/2018 due to E. coli sepsis/UTI.    # followed up with vascular surgeon of peripheral vascular disease. On Aspirin. Off plavix due to easy bruising.    INTERVAL HISTORY Marvin Williams. is a 74 y.o. male who has above history reviewed by me present for follow up of stage II B squamous lung cancer.  Patient reports having right side chest wall pain, wrapping from back to front. Patient has been started on gabapentin and tramadol by primary care provider. Chronic cough in the morning, no change.  Denies any hemoptysis, unintentional weight loss, fever or chills. Patient has chronic shortness of breath with exertion.  Review of Systems  Constitutional: Negative for chills, fever, malaise/fatigue and weight loss.  HENT: Negative for sore throat.   Eyes: Negative for redness.  Respiratory: Positive for shortness of breath. Negative for cough and wheezing.   Cardiovascular: Negative for chest pain,  palpitations and leg swelling.  Gastrointestinal: Negative for abdominal pain, blood in stool, nausea and vomiting.  Genitourinary: Negative for dysuria.  Musculoskeletal: Negative for myalgias.       Right side chest wall pain  Skin: Negative for rash.  Neurological: Negative for dizziness, tingling and tremors.  Endo/Heme/Allergies: Does not bruise/bleed easily.  Psychiatric/Behavioral: Negative for hallucinations.    MEDICAL HISTORY:  Past Medical History:  Diagnosis Date  . Cancer ([place])    skin  . HTN (hypertension)   . Hyperlipemia   . Lung cancer ([place])   . Peripheral arterial disease ([place])     SURGICAL HISTORY: Past Surgical History:  Procedure Laterality Date  . BACK SURGERY    . BACK SURGERY     1971  . COLONOSCOPY    . ESOPHAGOGASTRODUODENOSCOPY    . LOWER EXTREMITY ANGIOGRAPHY Right 05/01/2018   Procedure: LOWER EXTREMITY ANGIOGRAPHY;  Surgeon: Algernon Huxley, MD;  Location: [facility];  Service: Cardiovascular;  Laterality: Right;  . LOWER EXTREMITY ANGIOGRAPHY Left 05/08/2018   Procedure: LOWER EXTREMITY ANGIOGRAPHY;  Surgeon: Algernon Huxley, MD;  Location: [facility];  Service: Cardiovascular;  Laterality: Left;  . LOWER EXTREMITY ANGIOGRAPHY Right 07/30/2019   Procedure: LOWER EXTREMITY ANGIOGRAPHY;  Surgeon: Algernon Huxley, MD;  Location: [facility];  Service: Cardiovascular;  Laterality: Right;    SOCIAL HISTORY: Social History   Socioeconomic History  . Marital status: Married    Spouse name: Not on file  . Number of children: Not on file  . Years of education: Not on file  . Highest education  level: Not on file  Occupational History  . Not on file  Tobacco Use  . Smoking status: Former Research scientist (life sciences)  . Smokeless tobacco: Never Used  . Tobacco comment: Last cigarette on 06/04/17  Vaping Use  . Vaping Use: Never used  Substance and Sexual Activity  . Alcohol use: No  . Drug use: No  . Sexual activity: Not on file  Other Topics  Concern  . Not on file  Social History Narrative  . Not on file   Social Determinants of Health   Financial Resource Strain: Not on file  Food Insecurity: Not on file  Transportation Needs: Not on file  Physical Activity: Not on file  Stress: Not on file  Social Connections: Not on file  Intimate Partner Violence: Not on file    FAMILY HISTORY: Family History  Problem Relation Age of Onset  . Cancer Mother   . Cancer Father   . Cancer Sister     ALLERGIES:  is allergic to naproxen.  MEDICATIONS:  Current Outpatient Medications  Medication Sig Dispense Refill  . atorvastatin (LIPITOR) 10 MG tablet TAKE 1 TABLET BY MOUTH DAILY 30 tablet 11  . clopidogrel (PLAVIX) 75 MG tablet Take 1 tablet (75 mg total) by mouth daily. 90 tablet 3  . gabapentin (NEURONTIN) 100 MG capsule Take 100 mg by mouth daily.    [name] losartan-hydrochlorothiazide (HYZAAR) 100-12.5 MG tablet Take 1 tablet by mouth daily.    . tamsulosin (FLOMAX) 0.4 MG CAPS capsule Take 1 capsule (0.4 mg total) by mouth daily. 90 capsule 1  . traMADol (ULTRAM) 50 MG tablet Take 1 tablet (50 mg total) by mouth every 8 (eight) hours as needed. 10 tablet 0   No current facility-administered medications for this visit.     PHYSICAL EXAMINATION: ECOG PERFORMANCE STATUS: 1 - Symptomatic but completely ambulatory See [facility] Physical Exam Constitutional:      General: He is not in acute distress.    Appearance: He is not diaphoretic.  HENT:     Head: Normocephalic and atraumatic.     Nose: Nose normal.     Mouth/Throat:     Pharynx: No oropharyngeal exudate.  Eyes:     General: No scleral icterus.       Left eye: No discharge.     Conjunctiva/sclera: Conjunctivae normal.     Pupils: Pupils are equal, round, and reactive to light.  Neck:     Vascular: No JVD.  Cardiovascular:     Rate and Rhythm: Normal rate and regular rhythm.     Heart sounds: Normal heart sounds. No murmur heard. No friction rub.  Pulmonary:      Effort: Pulmonary effort is normal. No respiratory distress.     Breath sounds: No rales.     Comments: Decreased breath sound bilaterally Chest:     Chest wall: No tenderness.  Abdominal:     General: Bowel sounds are normal. There is no distension.     Palpations: Abdomen is soft.     Tenderness: There is no abdominal tenderness.  Musculoskeletal:        General: Normal range of motion.     Cervical back: Normal range of motion and neck supple.  Lymphadenopathy:     Cervical: No cervical adenopathy.  Skin:    General: Skin is warm and dry.     Findings: No erythema.  Neurological:     Mental Status: He is alert and oriented to person, place, and time.  Cranial Nerves: No cranial nerve deficit.     Motor: No abnormal muscle tone.     Coordination: Coordination normal.  Psychiatric:        Mood and Affect: Affect normal.      LABORATORY DATA:  I have reviewed the data as listed Lab Results  Component Value Date   WBC 7.9 05/16/2020   HGB 14.7 05/16/2020   HCT 43.9 05/16/2020   MCV 88.0 05/16/2020   PLT 313 05/16/2020   Recent Labs    07/17/19 1258 11/12/19 1005 11/14/19 0954 05/16/20 0954  NA 137  --  137 137  K 4.0  --  5.2* 5.3*  CL 104  --  103 103  CO2 23  --  25 25  GLUCOSE 87  --  111* 103*  BUN 23  --  28* 19  CREATININE 1.38* 1.50* 1.58* 1.33*  CALCIUM 9.1  --  9.3 9.6  GFRNONAA 50*  --  42* 55*  GFRAA 58*  --  49*  --   PROT 7.3  --  7.5 7.7  ALBUMIN 3.9  --  4.0 4.1  AST 18  --  19 20  ALT 26  --  22 22  ALKPHOS 72  --  76 82  BILITOT 0.4  --  0.7 0.5   RADIOGRAPHIC STUDIES: I have personally reviewed the radiological images as listed and agreed with the findings in the report. 07/06/2018 CT chest with contrast Showed continued reduction in right lower lobe lung mass.  Continue evaluation of surrounding radiation changes.  No evidence of metastatic disease in the chest. CT Chest Wo Contrast  Result Date: 05/16/2020 CLINICAL DATA:   Right-sided lung cancer.  Restaging. EXAM: CT CHEST WITHOUT CONTRAST TECHNIQUE: Multidetector CT imaging of the chest was performed following the standard protocol without IV contrast. COMPARISON:  11/12/2019 FINDINGS: Cardiovascular: The heart size is normal. No substantial pericardial effusion. Coronary artery calcification is evident. Atherosclerotic calcification is noted in the wall of the thoracic aorta. Mediastinum/Nodes: No mediastinal lymphadenopathy. Calcified nodal tissue is seen in both hilar regions and the subcarinal station. No evidence for gross hilar lymphadenopathy although assessment is limited by the lack of intravenous contrast on today's study. The esophagus has normal imaging features. There is no axillary lymphadenopathy. Lungs/Pleura: Centrilobular and paraseptal emphysema evident. Stable biapical pleuroparenchymal scarring. Post treatment fibrosis parahilar right lower lobe again noted. These changes are more confluent today than previously (compare axial image 99/3 today to image 96/3 previously and coronal image 116/5 today to coronal image 115/5 previously). Calcified granuloma again noted posterior left lower lobe no pleural effusion. Upper Abdomen: Tiny hypodensities in the liver parenchyma are stable, likely cyst. Bilateral renal cysts again noted. Musculoskeletal: No worrisome lytic or sclerotic osseous abnormality. IMPRESSION: 1. Post treatment fibrosis parahilar right lower lobe is more confluent today than previously. These changes may reflect evolving fibrosis/scar, but local recurrence could certainly have this appearance. Close follow-up recommended and PET-CT may be warranted to further evaluate. 2. No evidence for metastatic disease in the chest. 3. Aortic Atherosclerosis (ICD10-I70.0) and Emphysema (ICD10-J43.9). Electronically Signed   By: Kennith Center M.D.   On: 05/16/2020 06:01    ASSESSMENT & PLAN:  Cancer Staging Squamous carcinoma of lung, right (HCC) Staging  form: Lung, AJCC 8th Edition - Clinical stage from 06/16/2017: Stage IIB (cT3, cN0, cM0) - Signed by Rickard Patience, MD on 06/16/2017  1. Squamous carcinoma of lung, right (HCC)   2. Stage 3a chronic kidney disease (HCC)  3. Hyperkalemia   4. Chest wall pain    # Stage IIB lung squamous cancer s/p RT, finished at the end of February 2019. . 05/15/2020, CT chest without contrast showed more confluent posttreatment fibrosis perihilar in the right lower lobe.  This could be evolving fibrosis/scar, local recurrence Patient has been seen by Dr. Baruch Gouty and has been recommended to get PET scan done for further evaluation. Further management plan pending  #CKD,  multiple myeloma panel is negative for monoclonal protein. Light chain ratio is normal. Elevated free light chain levels are anticipated in CKD. I recommend patient to discuss with primary care provider. Avoid nephrotoxins.  #Chest wall pain, no evidence of shingles on examination.  Pending above work-up. #Patient has persistent hyperkalemia.  Potassium level 5.3. Losartan-hydrochlorothiazide is listed on his medication list however patient reports that he may not be taking it.  I asked him to clarify with Dr. Caryl Comes.   The patient knows to call the clinic with any problems questions or concerns.   Return of visit: To be determined   Earlie Server, MD, PhD Hematology Oncology [facility] at [facility] Pager- [phone] 05/16/2020

## 2020-05-16 NOTE — Progress Notes (Signed)
Patient here for follow up. Pt reports pain to right ribcage, that started in his back approx 3 weeks ago.

## 2020-05-19 ENCOUNTER — Other Ambulatory Visit: Payer: Medicare Other

## 2020-05-19 ENCOUNTER — Ambulatory Visit: Payer: Medicare Other | Admitting: Oncology

## 2020-06-10 ENCOUNTER — Ambulatory Visit
Admission: RE | Admit: 2020-06-10 | Discharge: 2020-06-10 | Disposition: A | Discharge status: Home / Self Care | Payer: Medicare Other | Source: Ambulatory Visit | Attending: Radiation Oncology | Admitting: Radiation Oncology

## 2020-06-10 ENCOUNTER — Other Ambulatory Visit: Payer: Self-pay

## 2020-06-10 DIAGNOSIS — J439 Emphysema, unspecified: Secondary | ICD-10-CM | POA: Insufficient documentation

## 2020-06-10 DIAGNOSIS — K5730 Diverticulosis of large intestine without perforation or abscess without bleeding: Secondary | ICD-10-CM | POA: Insufficient documentation

## 2020-06-10 DIAGNOSIS — C3431 Malignant neoplasm of lower lobe, right bronchus or lung: Secondary | ICD-10-CM | POA: Insufficient documentation

## 2020-06-10 DIAGNOSIS — C3490 Malignant neoplasm of unspecified part of unspecified bronchus or lung: Secondary | ICD-10-CM

## 2020-06-10 LAB — GLUCOSE, CAPILLARY: Glucose-Capillary: 107 mg/dL — ABNORMAL HIGH (ref 70–99)

## 2020-06-10 MED ORDER — FLUDEOXYGLUCOSE F - 18 (FDG) INJECTION
9.1000 | Freq: Once | INTRAVENOUS | Status: AC | PRN
  Administered 2020-06-10: 9.43 via INTRAVENOUS

## 2020-06-11 ENCOUNTER — Ambulatory Visit: Payer: Medicare Other | Admitting: Radiation Oncology

## 2020-06-11 LAB — EXTERNAL GENERIC LAB PROCEDURE

## 2020-06-13 ENCOUNTER — Encounter: Payer: Self-pay | Admitting: Radiation Oncology

## 2020-06-13 ENCOUNTER — Other Ambulatory Visit: Payer: Self-pay

## 2020-06-13 ENCOUNTER — Ambulatory Visit
Admission: RE | Admit: 2020-06-13 | Discharge: 2020-06-13 | Disposition: A | Discharge status: Home / Self Care | Payer: Medicare Other | Source: Ambulatory Visit | Attending: Radiation Oncology | Admitting: Radiation Oncology

## 2020-06-13 ENCOUNTER — Other Ambulatory Visit: Payer: Self-pay | Admitting: *Deleted

## 2020-06-13 VITALS — BP 148/84 | HR 103 | Temp 96.7°F | Resp 16 | Wt 177.7 lb

## 2020-06-13 DIAGNOSIS — C3431 Malignant neoplasm of lower lobe, right bronchus or lung: Secondary | ICD-10-CM

## 2020-07-06 ENCOUNTER — Other Ambulatory Visit (INDEPENDENT_AMBULATORY_CARE_PROVIDER_SITE_OTHER): Payer: Self-pay | Admitting: Vascular Surgery

## 2020-07-15 ENCOUNTER — Telehealth: Payer: Self-pay

## 2020-07-15 DIAGNOSIS — C3491 Malignant neoplasm of unspecified part of right bronchus or lung: Secondary | ICD-10-CM

## 2020-07-15 NOTE — Telephone Encounter (Signed)
-----   Message from Earlie Server, MD sent at 07/14/2020 11:15 PM EST ----- Please arrange him to see after next CT which is already schedule next year by Dr.Chrystal. lab md CBC CMp

## 2020-07-15 NOTE — Telephone Encounter (Signed)
Patient notified via [place].  Please schedule as MD recommends.

## 2020-07-15 NOTE — Telephone Encounter (Signed)
Done.. Pt was already sched to RTC on 06/12/21 to see Dr.Crystal @ 9am So I have him sched for labs @ 9:30 /See Dr.Yu same day @ 10am

## 2020-07-17 NOTE — Telephone Encounter (Signed)
General message left on voicemail that he has been scheduled to see Dr. Tasia Catchings next year and he can review those on MyChart.

## 2020-07-22 ENCOUNTER — Telehealth: Payer: Self-pay | Admitting: *Deleted

## 2020-07-22 NOTE — Telephone Encounter (Signed)
Our office received a clearance request from [facility]. I s/w the office in regards to needing pt's DOB, confirmed DOB 04-Jan-2046. After I reviewed the chart I saw the pt has never been seen by our practice. I then called back and left message to please confirm if this is a referral to cardiology or is the pt maybe seen by another cardiology office.

## 2020-07-23 NOTE — Telephone Encounter (Signed)
I tried to reach the dental office again; see note from yesterday 2/22. I see that the pt may possibly follow cardiologist Dr. Leotis Pain. Asked for dental office to please call back and let me know if we need to get a new pt appt or if the pt is seen by Dr. Leotis Pain. I will await until I hear back from the dental office.

## 2020-08-01 ENCOUNTER — Other Ambulatory Visit (INDEPENDENT_AMBULATORY_CARE_PROVIDER_SITE_OTHER): Payer: Self-pay | Admitting: Vascular Surgery

## 2020-08-26 ENCOUNTER — Other Ambulatory Visit (INDEPENDENT_AMBULATORY_CARE_PROVIDER_SITE_OTHER): Payer: Self-pay | Admitting: Vascular Surgery

## 2021-01-03 ENCOUNTER — Inpatient Hospital Stay
Admission: EM | Admit: 2021-01-03 | Discharge: 2021-01-06 | DRG: 271 | Disposition: A | Discharge status: Home / Self Care | Payer: Medicare Other | Attending: Internal Medicine | Admitting: Internal Medicine

## 2021-01-03 ENCOUNTER — Emergency Department: Payer: Medicare Other

## 2021-01-03 ENCOUNTER — Other Ambulatory Visit: Payer: Self-pay

## 2021-01-03 DIAGNOSIS — T82856A Stenosis of peripheral vascular stent, initial encounter: Secondary | ICD-10-CM | POA: Diagnosis present

## 2021-01-03 DIAGNOSIS — N400 Enlarged prostate without lower urinary tract symptoms: Secondary | ICD-10-CM | POA: Diagnosis present

## 2021-01-03 DIAGNOSIS — D72829 Elevated white blood cell count, unspecified: Secondary | ICD-10-CM | POA: Diagnosis present

## 2021-01-03 DIAGNOSIS — I743 Embolism and thrombosis of arteries of the lower extremities: Secondary | ICD-10-CM | POA: Diagnosis present

## 2021-01-03 DIAGNOSIS — I739 Peripheral vascular disease, unspecified: Secondary | ICD-10-CM

## 2021-01-03 DIAGNOSIS — Z91128 Patient's intentional underdosing of medication regimen for other reason: Secondary | ICD-10-CM

## 2021-01-03 DIAGNOSIS — Z7902 Long term (current) use of antithrombotics/antiplatelets: Secondary | ICD-10-CM

## 2021-01-03 DIAGNOSIS — T45526A Underdosing of antithrombotic drugs, initial encounter: Secondary | ICD-10-CM | POA: Diagnosis present

## 2021-01-03 DIAGNOSIS — Y92009 Unspecified place in unspecified non-institutional (private) residence as the place of occurrence of the external cause: Secondary | ICD-10-CM

## 2021-01-03 DIAGNOSIS — I10 Essential (primary) hypertension: Secondary | ICD-10-CM | POA: Diagnosis present

## 2021-01-03 DIAGNOSIS — Z85828 Personal history of other malignant neoplasm of skin: Secondary | ICD-10-CM

## 2021-01-03 DIAGNOSIS — Z79899 Other long term (current) drug therapy: Secondary | ICD-10-CM

## 2021-01-03 DIAGNOSIS — Z95820 Peripheral vascular angioplasty status with implants and grafts: Secondary | ICD-10-CM | POA: Diagnosis not present

## 2021-01-03 DIAGNOSIS — Z85118 Personal history of other malignant neoplasm of bronchus and lung: Secondary | ICD-10-CM | POA: Diagnosis not present

## 2021-01-03 DIAGNOSIS — M79604 Pain in right leg: Secondary | ICD-10-CM | POA: Diagnosis present

## 2021-01-03 DIAGNOSIS — I129 Hypertensive chronic kidney disease with stage 1 through stage 4 chronic kidney disease, or unspecified chronic kidney disease: Secondary | ICD-10-CM | POA: Diagnosis present

## 2021-01-03 DIAGNOSIS — Z20822 Contact with and (suspected) exposure to covid-19: Secondary | ICD-10-CM | POA: Diagnosis present

## 2021-01-03 DIAGNOSIS — Z87891 Personal history of nicotine dependence: Secondary | ICD-10-CM | POA: Diagnosis not present

## 2021-01-03 DIAGNOSIS — I70221 Atherosclerosis of native arteries of extremities with rest pain, right leg: Secondary | ICD-10-CM | POA: Diagnosis present

## 2021-01-03 DIAGNOSIS — E785 Hyperlipidemia, unspecified: Secondary | ICD-10-CM | POA: Diagnosis present

## 2021-01-03 DIAGNOSIS — I7090 Unspecified atherosclerosis: Secondary | ICD-10-CM | POA: Diagnosis not present

## 2021-01-03 DIAGNOSIS — Y838 Other surgical procedures as the cause of abnormal reaction of the patient, or of later complication, without mention of misadventure at the time of the procedure: Secondary | ICD-10-CM | POA: Diagnosis present

## 2021-01-03 DIAGNOSIS — E7800 Pure hypercholesterolemia, unspecified: Secondary | ICD-10-CM | POA: Diagnosis present

## 2021-01-03 DIAGNOSIS — N1831 Chronic kidney disease, stage 3a: Secondary | ICD-10-CM | POA: Diagnosis present

## 2021-01-03 DIAGNOSIS — Z886 Allergy status to analgesic agent status: Secondary | ICD-10-CM | POA: Diagnosis not present

## 2021-01-03 LAB — URINALYSIS, ROUTINE W REFLEX MICROSCOPIC
Bacteria, UA: NONE SEEN
Bilirubin Urine: NEGATIVE
Glucose, UA: NEGATIVE mg/dL
Ketones, ur: NEGATIVE mg/dL
Leukocytes,Ua: NEGATIVE
Nitrite: NEGATIVE
Protein, ur: NEGATIVE mg/dL
Specific Gravity, Urine: 1.027 (ref 1.005–1.030)
pH: 6 (ref 5.0–8.0)

## 2021-01-03 LAB — CBC WITH DIFFERENTIAL/PLATELET
Abs Immature Granulocytes: 0.08 10*3/uL — ABNORMAL HIGH (ref 0.00–0.07)
Basophils Absolute: 0.1 10*3/uL (ref 0.0–0.1)
Basophils Relative: 0 %
Eosinophils Absolute: 0 10*3/uL (ref 0.0–0.5)
Eosinophils Relative: 0 %
HCT: 46.1 % (ref 39.0–52.0)
Hemoglobin: 15.8 g/dL (ref 13.0–17.0)
Immature Granulocytes: 1 %
Lymphocytes Relative: 6 %
Lymphs Abs: 0.9 10*3/uL (ref 0.7–4.0)
MCH: 30.5 pg (ref 26.0–34.0)
MCHC: 34.3 g/dL (ref 30.0–36.0)
MCV: 89 fL (ref 80.0–100.0)
Monocytes Absolute: 0.8 10*3/uL (ref 0.1–1.0)
Monocytes Relative: 6 %
Neutro Abs: 12.3 10*3/uL — ABNORMAL HIGH (ref 1.7–7.7)
Neutrophils Relative %: 87 %
Platelets: 228 10*3/uL (ref 150–400)
RBC: 5.18 MIL/uL (ref 4.22–5.81)
RDW: 13.4 % (ref 11.5–15.5)
WBC: 14.2 10*3/uL — ABNORMAL HIGH (ref 4.0–10.5)
nRBC: 0 % (ref 0.0–0.2)

## 2021-01-03 LAB — COMPREHENSIVE METABOLIC PANEL
ALT: 18 U/L (ref 0–44)
AST: 22 U/L (ref 15–41)
Albumin: 4.1 g/dL (ref 3.5–5.0)
Alkaline Phosphatase: 65 U/L (ref 38–126)
Anion gap: 9 (ref 5–15)
BUN: 23 mg/dL (ref 8–23)
CO2: 25 mmol/L (ref 22–32)
Calcium: 9.1 mg/dL (ref 8.9–10.3)
Chloride: 103 mmol/L (ref 98–111)
Creatinine, Ser: 1.56 mg/dL — ABNORMAL HIGH (ref 0.61–1.24)
GFR, Estimated: 46 mL/min — ABNORMAL LOW (ref >60)
Glucose, Bld: 112 mg/dL — ABNORMAL HIGH (ref 70–99)
Potassium: 3.8 mmol/L (ref 3.5–5.1)
Sodium: 137 mmol/L (ref 135–145)
Total Bilirubin: 0.8 mg/dL (ref 0.3–1.2)
Total Protein: 7.5 g/dL (ref 6.5–8.1)

## 2021-01-03 LAB — RESP PANEL BY RT-PCR (FLU A&B, COVID) ARPGX2
Influenza A by PCR: NEGATIVE
Influenza B by PCR: NEGATIVE
SARS Coronavirus 2 by RT PCR: NEGATIVE

## 2021-01-03 LAB — LACTIC ACID, PLASMA: Lactic Acid, Venous: 1.2 mmol/L (ref 0.5–1.9)

## 2021-01-03 LAB — TROPONIN I (HIGH SENSITIVITY): Troponin I (High Sensitivity): 9 ng/L (ref <18)

## 2021-01-03 LAB — MAGNESIUM: Magnesium: 1.5 mg/dL — ABNORMAL LOW (ref 1.7–2.4)

## 2021-01-03 LAB — PROTIME-INR
INR: 1 (ref 0.8–1.2)
Prothrombin Time: 13.7 seconds (ref 11.4–15.2)

## 2021-01-03 LAB — APTT: aPTT: 37 seconds — ABNORMAL HIGH (ref 24–36)

## 2021-01-03 MED ORDER — TRAMADOL HCL 50 MG PO TABS
50.0000 mg | ORAL_TABLET | Freq: Three times a day (TID) | ORAL | Status: DC | PRN
  Administered 2021-01-04: 50 mg via ORAL
  Filled 2021-01-03: qty 1

## 2021-01-03 MED ORDER — ACETAMINOPHEN 650 MG RE SUPP: 650.0000 mg | Freq: Four times a day (QID) | RECTAL | Status: DC | PRN

## 2021-01-03 MED ORDER — ACETAMINOPHEN 325 MG PO TABS: 650.0000 mg | ORAL_TABLET | Freq: Four times a day (QID) | ORAL | Status: DC | PRN

## 2021-01-03 MED ORDER — HEPARIN (PORCINE) 25000 UT/250ML-% IV SOLN
1500.0000 [IU]/h | INTRAVENOUS | Status: DC
  Administered 2021-01-03: 1400 [IU]/h via INTRAVENOUS
  Administered 2021-01-04: 1250 [IU]/h via INTRAVENOUS
  Filled 2021-01-03 (×3): qty 250

## 2021-01-03 MED ORDER — SODIUM CHLORIDE 0.9 % IV SOLN: INTRAVENOUS | Status: AC

## 2021-01-03 MED ORDER — TAMSULOSIN HCL 0.4 MG PO CAPS
0.4000 mg | ORAL_CAPSULE | Freq: Every day | ORAL | Status: DC
  Administered 2021-01-04 – 2021-01-06 (×3): 0.4 mg via ORAL
  Filled 2021-01-03 (×3): qty 1

## 2021-01-03 MED ORDER — IOHEXOL 350 MG/ML SOLN
125.0000 mL | Freq: Once | INTRAVENOUS | Status: AC | PRN
  Administered 2021-01-03: 125 mL via INTRAVENOUS

## 2021-01-03 MED ORDER — HEPARIN BOLUS VIA INFUSION
4800.0000 [IU] | Freq: Once | INTRAVENOUS | Status: AC
  Administered 2021-01-03: 4800 [IU] via INTRAVENOUS
  Filled 2021-01-03: qty 4800

## 2021-01-03 MED ORDER — GABAPENTIN 100 MG PO CAPS
100.0000 mg | ORAL_CAPSULE | Freq: Every day | ORAL | Status: DC
  Administered 2021-01-04 – 2021-01-06 (×3): 100 mg via ORAL
  Filled 2021-01-03 (×3): qty 1

## 2021-01-03 MED ORDER — ATORVASTATIN CALCIUM 10 MG PO TABS
10.0000 mg | ORAL_TABLET | Freq: Every day | ORAL | Status: DC
  Administered 2021-01-03 – 2021-01-05 (×3): 10 mg via ORAL
  Filled 2021-01-03 (×3): qty 1

## 2021-01-03 NOTE — ED Notes (Addendum)
R toes blue and unable to find pedal pulse in R foot with doppler

## 2021-01-03 NOTE — ED Provider Notes (Signed)
[facility] Emergency Department Provider Note   ____________________________________________   Event Date/Time   First MD Initiated Contact with Patient 01/03/21 1620     (approximate)  I have reviewed the triage vital signs and the nursing notes.   HISTORY  Chief Complaint Leg Pain    HPI Marvin Williams. is a 75 y.o. male with below stated past medical history significant for peripheral vascular disease who presents for pain and swelling to the right foot that began approximately 3 hours prior to arrival and has been stable since onset.  Patient describes an aching/dull pain that is 8/10 in severity and slightly improved with naproxen that he took earlier today.  Patient states that he should be on a anticoagulation medicine that he had forgotten to take for the last few days but did room to take it this morning.          Past Medical History:  Diagnosis Date   Cancer ([place])    skin   HTN (hypertension)    Hyperlipemia    Lung cancer ([place])    Peripheral arterial disease ([place])     Patient Active Problem List   Diagnosis Date Noted   Arterial occlusion due to arteriosclerosis 01/03/2021   Claudication of right lower extremity ([place]) 01/03/2021   Leukocytosis 01/03/2021   Hyperlipemia    Peripheral arterial disease (HCC)    Abnormal x-ray of femur 08/29/2019   Stage 3a chronic kidney disease ([place]) 07/17/2019   Pulmonary emphysema ([place]) 07/17/2019   History of lung cancer 04/30/2019   Pulmonary fibrosis ([place]) 04/30/2019   UTI (urinary tract infection) 10/23/2018   Essential hypertension 06/13/2018   PAD (peripheral artery disease) ([place]) 10/12/2017   Benign prostatic hyperplasia 06/20/2017   Squamous carcinoma of lung, right ([place]) 06/16/2017   Atherosclerosis of native arteries of extremity with intermittent claudication ([place]) 08/13/2016   Hx of adenomatous colonic polyps 06/04/2014   Stenosing tenosynovitis of finger 03/15/2014    Rotator cuff tendinitis 11/12/2013    Past Surgical History:  Procedure Laterality Date   BACK SURGERY     BACK SURGERY     1971   COLONOSCOPY     ESOPHAGOGASTRODUODENOSCOPY     LOWER EXTREMITY ANGIOGRAPHY Right 05/01/2018   Procedure: LOWER EXTREMITY ANGIOGRAPHY;  Surgeon: Algernon Huxley, MD;  Location: [place] [facility];  Service: Cardiovascular;  Laterality: Right;   LOWER EXTREMITY ANGIOGRAPHY Left 05/08/2018   Procedure: LOWER EXTREMITY ANGIOGRAPHY;  Surgeon: Algernon Huxley, MD;  Location: [place] [facility];  Service: Cardiovascular;  Laterality: Left;   LOWER EXTREMITY ANGIOGRAPHY Right 07/30/2019   Procedure: LOWER EXTREMITY ANGIOGRAPHY;  Surgeon: Algernon Huxley, MD;  Location: [place] [facility];  Service: Cardiovascular;  Laterality: Right;    Prior to Admission medications   Medication Sig Start Date End Date Taking? Authorizing Provider  clopidogrel (PLAVIX) 75 MG tablet TAKE 1 TABLET(75 MG) BY MOUTH DAILY 08/26/20  Yes Kris Hartmann, NP  tamsulosin (FLOMAX) 0.4 MG CAPS capsule Take 1 capsule (0.4 mg total) by mouth daily. 03/27/19  Yes Earlie Server, MD  atorvastatin (LIPITOR) 10 MG tablet TAKE 1 TABLET BY MOUTH DAILY 06/08/19   Algernon Huxley, MD  gabapentin (NEURONTIN) 100 MG capsule Take 100 mg by mouth daily. 05/07/20   [provider]  hydrochlorothiazide (HYDRODIURIL) 12.5 MG tablet Take 12.5 mg by mouth daily. Patient not taking: Reported on 01/03/2021 06/02/20   [provider]  losartan-hydrochlorothiazide (HYZAAR) 100-12.5 MG tablet Take  1 tablet by mouth daily. Patient not taking: Reported on 01/03/2021    [provider]  traMADol (ULTRAM) 50 MG tablet Take 1 tablet (50 mg total) by mouth every 8 (eight) hours as needed. Patient not taking: Reported on 01/03/2021 07/30/19   Stegmayer, Janalyn Harder, PA-C    Allergies Naproxen  Family History  Problem Relation Age of Onset   Cancer Mother    Cancer Father    Cancer Sister     Social History Social  History   Tobacco Use   Smoking status: Former   Smokeless tobacco: Never   Tobacco comments:    Last cigarette on 06/04/17  Vaping Use   Vaping Use: Never used  Substance Use Topics   Alcohol use: No   Drug use: No    Review of Systems Constitutional: No fever/chills Eyes: No visual changes. ENT: No sore throat. Cardiovascular: Denies chest pain. Respiratory: Denies shortness of breath. Gastrointestinal: No abdominal pain.  No nausea, no vomiting.  No diarrhea. Genitourinary: Negative for dysuria. Musculoskeletal: Positive for acute right calf and foot pain Skin: Negative for rash. Neurological: Negative for headaches, weakness/numbness/paresthesias in any extremity Psychiatric: Negative for suicidal ideation/homicidal ideation   ____________________________________________   PHYSICAL EXAM:  VITAL SIGNS: ED Triage Vitals  Enc Vitals Group     BP 01/03/21 1606 (!) 191/107     Pulse Rate 01/03/21 1606 (!) 115     Resp 01/03/21 1606 18     Temp 01/03/21 1606 97.7 F (36.5 C)     Temp Source 01/03/21 1606 Oral     SpO2 01/03/21 1606 96 %     Weight 01/03/21 1603 176 lb (79.8 kg)     Height 01/03/21 1603 5\' 8"  (1.727 m)     Head Circumference --      Peak Flow --      Pain Score 01/03/21 1602 8     Pain Loc --      Pain Edu? --      Excl. in [place]? --    Constitutional: Alert and oriented. Well appearing and in no acute distress. Eyes: Conjunctivae are normal. PERRL. Head: Atraumatic. Nose: No congestion/rhinnorhea. Mouth/Throat: Mucous membranes are moist. Neck: No stridor Cardiovascular: Grossly normal heart sounds.  No palpable pulses over right foot DP or PT nor ultrasound color Doppler flow Respiratory: Normal respiratory effort.  No retractions. Gastrointestinal: Soft and nontender. No distention. Musculoskeletal: No obvious deformities Neurologic:  Normal speech and language. No gross focal neurologic deficits are appreciated. Skin: Right foot pallor skin  is warm and dry. No rash noted. Psychiatric: Mood and affect are normal. Speech and behavior are normal.  ____________________________________________   LABS (all labs ordered are listed, but only abnormal results are displayed)  Labs Reviewed  COMPREHENSIVE METABOLIC PANEL - Abnormal; Notable for the following components:      Result Value   Glucose, Bld 112 (*)    Creatinine, Ser 1.56 (*)    GFR, Estimated 46 (*)    All other components within normal limits  CBC WITH DIFFERENTIAL/PLATELET - Abnormal; Notable for the following components:   WBC 14.2 (*)    Neutro Abs 12.3 (*)    Abs Immature Granulocytes 0.08 (*)    All other components within normal limits  APTT - Abnormal; Notable for the following components:   aPTT 37 (*)    All other components within normal limits  MAGNESIUM - Abnormal; Notable for the following components:   Magnesium 1.5 (*)  All other components within normal limits  URINALYSIS, ROUTINE W REFLEX MICROSCOPIC - Abnormal; Notable for the following components:   Color, Urine STRAW (*)    APPearance CLEAR (*)    Hgb urine dipstick SMALL (*)    All other components within normal limits  RESP PANEL BY RT-PCR (FLU A&B, COVID) ARPGX2  LACTIC ACID, PLASMA  PROTIME-INR  CBC  MAGNESIUM  COMPREHENSIVE METABOLIC PANEL  HEPARIN LEVEL (UNFRACTIONATED)  TROPONIN I (HIGH SENSITIVITY)   RADIOLOGY  ED MD interpretation: CT angiography of the aortobifemoral vasculature with runoff shows bilaterally clotted stents with decreased flow to the right foot however with some collateral flow since last imaging  Official radiology report(s): CT Angio Aortobifemoral W and/or Wo Contrast  Result Date: 01/03/2021 CLINICAL DATA:  Right toe ischemia, history of previous revascularization, right lower lobe lung cancer EXAM: CT ANGIOGRAPHY OF ABDOMINAL AORTA WITH ILIOFEMORAL RUNOFF TECHNIQUE: Multidetector CT imaging of the abdomen, pelvis and lower extremities was performed  using the standard protocol during bolus administration of intravenous contrast. Multiplanar CT image reconstructions and MIPs were obtained to evaluate the vascular anatomy. CONTRAST:  146mL OMNIPAQUE IOHEXOL 350 MG/ML SOLN COMPARISON:  None. FINDINGS: VASCULAR Severe mixed calcific aortic atherosclerosis. No evidence of aneurysm, dissection, or other acute aortic pathology. 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 Atherosclerosis at the aortic branch vessel origins without high-grade stenosis. RIGHT Lower Extremity Inflow: Common, internal and external iliac arteries are patent without evidence of aneurysm, dissection, vasculitis or significant stenosis. Moderate to severe mixed calcific atherosclerosis. Mild tortuosity. Outflow: There is long segment occlusion of the right superficial femoral and popliteal arteries from the origin, including total occlusion of a superficial femoral arterial and popliteal artery stent. The profunda femoris is patent. Runoff: There is some intermittent reconstitution of the trifurcation vessels in the right lower extremity, presumably via geniculate collaterals. LEFT Lower Extremity Inflow: Common and external iliac arteries are patent without evidence of aneurysm, dissection, vasculitis or significant stenosis. 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 Moderate to severe mixed calcific atherosclerosis. Mild tortuosity. Outflow: Common, superficial and profunda femoral arteries are patent without evidence of aneurysm, dissection, vasculitis or significant stenosis. The superficial femoral artery terminates in geniculate collaterals, and the popliteal artery and trifurcation vessels are supplied by a  persistent left sciatic artery. The left popliteal artery is almost completely occluded across the level of the knee joint, and reconstituted below the knee joint by geniculate collaterals. Runoff: There is intermittent reconstitution of the trifurcation vessels via geniculate collateral flow. Veins: No obvious venous abnormality within the limitations of this arterial phase study. Review of the MIP images confirms the above findings. NON-VASCULAR Lower chest: No acute abnormality. Hepatobiliary: No focal liver abnormality is seen. No gallstones, gallbladder wall thickening, or biliary dilatation. Pancreas: Unremarkable. No pancreatic ductal dilatation or surrounding inflammatory changes. Spleen: Normal in size without focal abnormality. Adrenals/Urinary Tract: Adrenal glands are unremarkable. Multiple bilateral renal cortical and parapelvic cysts. Kidneys are otherwise normal, without renal calculi, solid lesion, or hydronephrosis. Bladder is unremarkable. Stomach/Bowel: Stomach is within normal limits. Appendix appears normal. No evidence of bowel wall thickening, distention, or inflammatory changes. Lymphatic: No significant vascular findings are present. No enlarged abdominal or pelvic lymph nodes. Reproductive: Uterus and bilateral adnexa are  unremarkable. Other: Small, fat containing bilateral inguinal hernias. No abdominopelvic ascites. Musculoskeletal: No acute or significant osseous findings. IMPRESSION: 1. There is long segment occlusion of the right superficial femoral and popliteal arteries from the origin, including total occlusion of a superficial femoral and popliteal artery stents. There is some intermittent reconstitution of the trifurcation vessels in the right lower extremity, presumably via geniculate collaterals. 2. 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 3. 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 The left popliteal artery is almost completely occluded across the level of the knee joint, and reconstituted below the knee joint by geniculate collaterals. There is intermittent reconstitution of the trifurcation vessels via geniculate collateral flow. 4. Aortic atherosclerosis. 5. No acute findings in the abdomen or pelvis on early arterial phase examination. Aortic Atherosclerosis (ICD10-I70.0). Electronically Signed   By: Eddie Candle M.D.   On: 01/03/2021 18:01    ____________________________________________   PROCEDURES  Procedure(s) performed (including Critical Care):  .1-3 Lead EKG Interpretation  Date/Time: 01/03/2021 10:45 PM Performed by: Naaman Plummer, MD Authorized by: Naaman Plummer, MD     Interpretation: normal     ECG rate:  92   ECG rate assessment: normal     Rhythm: sinus rhythm     Ectopy: none     Conduction: normal     ____________________________________________   INITIAL IMPRESSION / ASSESSMENT AND PLAN / ED COURSE  As part of my medical decision making, I reviewed the following data within the electronic medical record, if available:  Nursing notes reviewed and incorporated, Labs reviewed, EKG interpreted, Old chart reviewed, Radiograph reviewed and Notes from prior ED visits reviewed and incorporated        Epididymitis MDM patient is a 75 year old male that presents with signs and symptoms concerning for arterial occlusion.  Given history, physical exam, and work-up I have lower suspicion for cellulitis, DVT, osteomyelitis, necrotizing fasciitis  Imaging showing clotting of bilateral stents which fits in patient's description of medication nonadherence of his anticoagulation.  Consult: Spoke to Dr. Trula Slade in  vascular surgery who recommends heparin drip and admission to the internal medicine service with arterial evaluation on Monday.  Dispo: Admit to medicine Clinical Course as of 01/03/21 2245  Sat Jan 03, 2021  1903 CT Angio Aortobifemoral W and/or Wo Contrast [EB]    Clinical Course User Index [EB] Naaman Plummer, MD     ____________________________________________   FINAL CLINICAL IMPRESSION(S) / ED DIAGNOSES  Final diagnoses:  Right leg pain  Arterial embolism of right leg [facility])     ED Discharge Orders     None        Note:  This document was prepared using Dragon voice recognition software and may include unintentional dictation errors.    Naaman Plummer, MD 01/03/21 2245

## 2021-01-03 NOTE — Consult Note (Signed)
ANTICOAGULATION CONSULT NOTE - Initial Consult  Pharmacy Consult for heparin infusion Indication: acute superficial arterial occlusion   Allergies  Allergen Reactions   Naproxen Other (See Comments)    Per patient: oncologist prefers he does NOT take     Patient Measurements: Height: 5\' 8"  (172.7 cm) Weight: 79.8 kg (176 lb) IBW/kg (Calculated) : 68.4   Vital Signs: Temp: 97.7 F (36.5 C) (08/06 1606) Temp Source: Oral (08/06 1606) BP: 155/98 (08/06 2030) Pulse Rate: 94 (08/06 2030)  Labs: Recent Labs    01/03/21 1634 01/03/21 1948  HGB 15.8  --   HCT 46.1  --   PLT 228  --   APTT  --  37*  LABPROT  --  13.7  INR  --  1.0  CREATININE 1.56*  --   TROPONINIHS 9  --     Estimated Creatinine Clearance: 39 mL/min (A) (by C-G formula based on SCr of 1.56 mg/dL (H)).   Medical History: Past Medical History:  Diagnosis Date   Cancer ([place])    skin   HTN (hypertension)    Hyperlipemia    Lung cancer ([place])    Peripheral arterial disease (H[facility])     Medications:  No prior anticoagulation noted  Per wife, has not been taking antiplatelets in over a month  Assessment: 75 y.o. male  with hx of PAD presents with acute onset right leg pain/swelling. Per family, stopped taking all this medications including antiplatelets over a month ago. CT showing superficial femoral and popliteal ateria occlusion. Pharmacy has been consulted for heparin infusion  Goal of Therapy:  Heparin level 0.3-0.7 units/ml Monitor platelets by anticoagulation protocol: Yes   Plan:  Give 4800 units bolus x 1 Start heparin infusion at 1400 units/hr Check anti-Xa level in 8 hours and daily while on heparin Continue to monitor H&H and platelets  Dorothe Pea, PharmD, BCPS Clinical Pharmacist   01/03/2021,8:56 PM

## 2021-01-03 NOTE — H&P (Signed)
History and Physical    PLEASE NOTE THAT DRAGON DICTATION SOFTWARE WAS USED IN THE CONSTRUCTION OF THIS NOTE.   Marvin Williams:[id number] DOB: 1945-06-02 DOA: 01/03/2021  PCP: Adin Hector, MD Patient coming from: home   I have personally briefly reviewed patient's old medical records in [place]  Chief Complaint: Right leg pain  HPI: Marvin Williams. is a 75 y.o. male with medical history significant for peripheral artery disease status post stents to the right superficial femoral popliteal arteries, hypertension, hyperlipidemia, BPH, CKD 3A with baseline creatinine 1.3-1.6, who is admitted to [facility] on 01/03/2021 with partial arterial occlusion in the right lower extremity after presenting from home to [facility] ED complaining of right lower extremity pain.   The patient reports that he was working on his car outside earlier today when he developed sudden onset right calf discomfort radiating into the dorsal and plantar aspects of his right foot as he was ambulating up the 3 stairs back into his house.  He notes that this was associated with his right lower extremity feeling cold to the touch as well as some numbness, with these latter symptoms gradually resolving back to baseline over the course of the next 1 to 2 hours without subsequent recurrence.  Over that time period, he also noted his right calf/right foot discomfort to further worsen with ambulation.  He took 2 naproxen at home and subsequently came to [facility] ED for further evaluation.  Reports that his right calf and right foot discomfort was constant for 1 to 2 hours before completely resolving, and denies any residual discomfort at this time.  During the period of time in which she was experiencing the aforementioned discomfort, he also noted his that his right foot appeared "more white" than usually, although this change in coloration has also resolved back to his baseline level concurrent with the  improvement in his right lower extremity pain and numbness, and denies any recent associated rash, including no lower extremity erythema. Denies any similar symptoms associated with his left lower extremity.  Denies any recent trauma.  He conveys that he has been spending some additional time outside recently in the warm temperatures, including working on his car, without concomitant increase in oral intake of water, and volunteers that he may be slightly dehydrated on this basis. Not recent peripheral edema.   The patient acknowledges a history of peripheral artery disease, for which he follows with Dr.Dew of vascular surgery.  Patient reports that he has underwent arterial stenting involving the right lower extremity in Dec '19.  He reports subsequent occlusion of the associated stents before undergoing bypass grafting procedure in March 2021.  In the setting of this history of peripheral artery disease, the patient knowledges that he is prescribed Plavix, but volunteers that he is been noncompliant with this medication, conveying that he has not taken any doses of his Plavix for at least the last 2 months due to bruising on his upper extremities that he was experiencing when compliant with this antiplatelet medication.  At the time of onset of his right lower extremity discomfort today, the patient took a dose of Plavix, representing the first such dose he had had in the last several months.  He also conveys that he has not been taking atorvastatin or any other antilipid agent over that timeframe.  He also denies any recent use of aspirin.  The patient knowledges a history of BPH for which he is  on Flomax.  He also reports that he takes a potassium supplement.  Aside from the Flomax and potassium supplement, the patient reports that he is not taking any additional scheduled medications at home, including no additional antihypertensive medications, including his HCTZ and losartan.  Completely off of any and  hypertensive medications, the patient reports that his baseline systolic blood pressures run in the 150s to 160s mmHg. he confirms that he is a former smoker, having completely quit smoking in 2019, without subsequent resumption.  Denies any recent subjective fever, chills, rigors, or generalized myalgias. Denies any recent headache, neck stiffness, rhinitis, rhinorrhea, sore throat, sob, wheezing, cough, nausea, vomiting, abdominal pain, diarrhea. No recent traveling or known COVID-19 exposures. Denies dysuria, gross hematuria, or change in urinary urgency/frequency.  Denies any recent chest pain, diaphoresis, or palpitations.     ED Course:  Vital signs in the ED were notable for the following: Temperature max 97.7, heart rate 86-98; initial blood pressure noted to be 178/95, with ensuing improvement 142/92 following interval improvement of his right lower extremity pain; respiratory rate 17-18, oxygen saturation 94 to 96% on room air.  Labs were notable for the following: CMP notable for the following: Sodium 137, potassium 3.8, bicarbonate 25, BUN 23, creatinine 1.56.  Lactic acid 1.2.  CBC notable for white blood cell count 14,200 with 87% neutrophils, hemoglobin 15.8, platelets 228.  INR/PTT have been ordered, with results currently pending.  CT angiography of the abdominal aorta with bilateral iliofemoral runoff was notable for the following, with additional results as documented below: long segment occlusion of the right superficial femoral and popliteal arteries from the origin, including total occlusion of a superficial femoral and popliteal artery stents. There is some intermittent reconstitution of the trifurcation vessels in the right lower extremity, presumably via geniculate collaterals.  The EDP discussed the patient's case with the on-call vascular surgeon, Dr.Bragham, who recommended admission to the hospital service for further evaluation and management of presenting partial  arterial occlusion of the right lower extremity. Dr. Marion Downer will evaluate the patient in person tomorrow morning, with recommendation for administration of heparin drip, and tentative plan for arteriogram on Monday (01/05/21).   While in the ED, the following were administered: Heparin bolus followed by initiation of heparin drip per patient pharmacy consult.    Review of Systems: As per HPI otherwise 10 point review of systems negative.   Past Medical History:  Diagnosis Date   Cancer ([place])    skin   HTN (hypertension)    Hyperlipemia    Lung cancer ([place])    Peripheral arterial disease ([place])     Past Surgical History:  Procedure Laterality Date   BACK SURGERY     BACK SURGERY     1971   COLONOSCOPY     ESOPHAGOGASTRODUODENOSCOPY     LOWER EXTREMITY ANGIOGRAPHY Right 05/01/2018   Procedure: LOWER EXTREMITY ANGIOGRAPHY;  Surgeon: Algernon Huxley, MD;  Location: [place] [facility];  Service: Cardiovascular;  Laterality: Right;   LOWER EXTREMITY ANGIOGRAPHY Left 05/08/2018   Procedure: LOWER EXTREMITY ANGIOGRAPHY;  Surgeon: Algernon Huxley, MD;  Location: [place] [facility];  Service: Cardiovascular;  Laterality: Left;   LOWER EXTREMITY ANGIOGRAPHY Right 07/30/2019   Procedure: LOWER EXTREMITY ANGIOGRAPHY;  Surgeon: Algernon Huxley, MD;  Location: [place] [facility];  Service: Cardiovascular;  Laterality: Right;    Social History:  reports that he has quit smoking. He has never used smokeless tobacco. He reports that he does not drink  alcohol and does not use drugs.   Allergies  Allergen Reactions   Naproxen Other (See Comments)    Per patient: oncologist prefers he does NOT take     Family History  Problem Relation Age of Onset   Cancer Mother    Cancer Father    Cancer Sister     Family history reviewed and not pertinent    Prior to Admission medications   Medication Sig Start Date End Date Taking? Authorizing Provider  atorvastatin (LIPITOR) 10 MG tablet TAKE 1 TABLET  BY MOUTH DAILY 06/08/19   Algernon Huxley, MD  clopidogrel (PLAVIX) 75 MG tablet TAKE 1 TABLET(75 MG) BY MOUTH DAILY 08/26/20   Kris Hartmann, NP  gabapentin (NEURONTIN) 100 MG capsule Take 100 mg by mouth daily. 05/07/20   [provider]  hydrochlorothiazide (HYDRODIURIL) 12.5 MG tablet Take 12.5 mg by mouth daily. 06/02/20   [provider]  losartan-hydrochlorothiazide (HYZAAR) 100-12.5 MG tablet Take 1 tablet by mouth daily.    [provider]  tamsulosin (FLOMAX) 0.4 MG CAPS capsule Take 1 capsule (0.4 mg total) by mouth daily. 03/27/19   Earlie Server, MD  traMADol (ULTRAM) 50 MG tablet Take 1 tablet (50 mg total) by mouth every 8 (eight) hours as needed. 07/30/19   Stegmayer, Janalyn Harder, PA-C     Objective    Physical Exam: Vitals:   01/03/21 1603 01/03/21 1606 01/03/21 1717 01/03/21 1900  BP:  (!) 191/107 (!) 178/98 (!) 178/95  Pulse:  (!) 115 98 86  Resp:  $Remo'18 17 18  'gCbhy$ Temp:  97.7 F (36.5 C)    TempSrc:  Oral    SpO2:  96% 96% 94%  Weight: 79.8 kg     Height: $Remove'5\' 8"'QuyDiTb$  (1.727 m)       General: appears to be stated age; alert, oriented Skin: warm, dry, no rash Head:  AT/[place] Mouth:  Oral mucosa membranes appear dry, normal dentition Neck: supple; trachea midline Heart:  RRR; did not appreciate any M/R/G Lungs: CTAB, did not appreciate any wheezes, rales, or rhonchi Abdomen: + BS; soft, ND, NT Extremities: no peripheral edema, no muscle wasting Neuro: strength and sensation intact in upper and lower extremities b/l     Labs on Admission: I have personally reviewed following labs and imaging studies  CBC: Recent Labs  Lab 01/03/21 1634  WBC 14.2*  NEUTROABS 12.3*  HGB 15.8  HCT 46.1  MCV 89.0  PLT 287   Basic Metabolic Panel: Recent Labs  Lab 01/03/21 1634  NA 137  K 3.8  CL 103  CO2 25  GLUCOSE 112*  BUN 23  CREATININE 1.56*  CALCIUM 9.1   GFR: Estimated Creatinine Clearance: 39 mL/min (A) (by C-G formula based on SCr of 1.56 mg/dL  (H)). Liver Function Tests: Recent Labs  Lab 01/03/21 1634  AST 22  ALT 18  ALKPHOS 65  BILITOT 0.8  PROT 7.5  ALBUMIN 4.1   No results for input(s): LIPASE, AMYLASE in the last 168 hours. No results for input(s): AMMONIA in the last 168 hours. Coagulation Profile: No results for input(s): INR, PROTIME in the last 168 hours. Cardiac Enzymes: No results for input(s): CKTOTAL, CKMB, CKMBINDEX, TROPONINI in the last 168 hours. BNP (last 3 results) No results for input(s): PROBNP in the last 8760 hours. HbA1C: No results for input(s): HGBA1C in the last 72 hours. CBG: No results for input(s): GLUCAP in the last 168 hours. Lipid Profile: No results for input(s): CHOL, HDL, LDLCALC,  TRIG, CHOLHDL, LDLDIRECT in the last 72 hours. Thyroid Function Tests: No results for input(s): TSH, T4TOTAL, FREET4, T3FREE, THYROIDAB in the last 72 hours. Anemia Panel: No results for input(s): VITAMINB12, FOLATE, FERRITIN, TIBC, IRON, RETICCTPCT in the last 72 hours. Urine analysis:    Component Value Date/Time   COLORURINE YELLOW (A) 10/23/2018 0556   APPEARANCEUR CLOUDY (A) 10/23/2018 0556   LABSPEC 1.026 10/23/2018 0556   PHURINE 6.0 10/23/2018 0556   GLUCOSEU NEGATIVE 10/23/2018 0556   HGBUR MODERATE (A) 10/23/2018 0556   BILIRUBINUR NEGATIVE 10/23/2018 0556   KETONESUR NEGATIVE 10/23/2018 0556   PROTEINUR NEGATIVE 10/23/2018 0556   NITRITE NEGATIVE 10/23/2018 0556   LEUKOCYTESUR LARGE (A) 10/23/2018 0556    Radiological Exams on Admission: CT Angio Aortobifemoral W and/or Wo Contrast  Result Date: 01/03/2021 CLINICAL DATA:  Right toe ischemia, history of previous revascularization, right lower lobe lung cancer EXAM: CT ANGIOGRAPHY OF ABDOMINAL AORTA WITH ILIOFEMORAL RUNOFF TECHNIQUE: Multidetector CT imaging of the abdomen, pelvis and lower extremities was performed using the standard protocol during bolus administration of intravenous contrast. Multiplanar CT image reconstructions and  MIPs were obtained to evaluate the vascular anatomy. CONTRAST:  151mL OMNIPAQUE IOHEXOL 350 MG/ML SOLN COMPARISON:  None. FINDINGS: VASCULAR Severe mixed calcific aortic atherosclerosis. No evidence of aneurysm, dissection, or other acute aortic pathology. 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 Atherosclerosis at the aortic branch vessel origins without high-grade stenosis. RIGHT Lower Extremity Inflow: Common, internal and external iliac arteries are patent without evidence of aneurysm, dissection, vasculitis or significant stenosis. Moderate to severe mixed calcific atherosclerosis. Mild tortuosity. Outflow: There is long segment occlusion of the right superficial femoral and popliteal arteries from the origin, including total occlusion of a superficial femoral arterial and popliteal artery stent. The profunda femoris is patent. Runoff: There is some intermittent reconstitution of the trifurcation vessels in the right lower extremity, presumably via geniculate collaterals. LEFT Lower Extremity Inflow: Common and external iliac arteries are patent without evidence of aneurysm, dissection, vasculitis or significant stenosis. 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 Moderate to severe mixed calcific atherosclerosis. Mild tortuosity. Outflow: Common, superficial and profunda femoral arteries are patent without evidence of aneurysm, dissection, vasculitis or significant stenosis. The superficial femoral artery terminates in geniculate collaterals, and the popliteal artery and trifurcation vessels are supplied by a persistent left sciatic artery. The left popliteal artery is almost completely occluded across the level of the knee joint, and  reconstituted below the knee joint by geniculate collaterals. Runoff: There is intermittent reconstitution of the trifurcation vessels via geniculate collateral flow. Veins: No obvious venous abnormality within the limitations of this arterial phase study. Review of the MIP images confirms the above findings. NON-VASCULAR Lower chest: No acute abnormality. Hepatobiliary: No focal liver abnormality is seen. No gallstones, gallbladder wall thickening, or biliary dilatation. Pancreas: Unremarkable. No pancreatic ductal dilatation or surrounding inflammatory changes. Spleen: Normal in size without focal abnormality. Adrenals/Urinary Tract: Adrenal glands are unremarkable. Multiple bilateral renal cortical and parapelvic cysts. Kidneys are otherwise normal, without renal calculi, solid lesion, or hydronephrosis. Bladder is unremarkable. Stomach/Bowel: Stomach is within normal limits. Appendix appears normal. No evidence of bowel wall thickening, distention, or inflammatory changes. Lymphatic: No significant vascular findings are present. No enlarged abdominal or  pelvic lymph nodes. Reproductive: Uterus and bilateral adnexa are unremarkable. Other: Small, fat containing bilateral inguinal hernias. No abdominopelvic ascites. Musculoskeletal: No acute or significant osseous findings. IMPRESSION: 1. There is long segment occlusion of the right superficial femoral and popliteal arteries from the origin, including total occlusion of a superficial femoral and popliteal artery stents. There is some intermittent reconstitution of the trifurcation vessels in the right lower extremity, presumably via geniculate collaterals. 2. 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 3. 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 The left popliteal artery is almost completely occluded across the level of the knee joint, and reconstituted below the knee joint by geniculate collaterals. There is intermittent reconstitution of the trifurcation vessels via geniculate collateral flow. 4. Aortic atherosclerosis. 5. No acute findings in the abdomen or pelvis on early arterial phase examination. Aortic Atherosclerosis (ICD10-I70.0). Electronically Signed   By: Eddie Candle M.D.   On: 01/03/2021 18:01       Assessment/Plan   Marvin Williams. is a 75 y.o. male with medical history significant for peripheral artery disease status post stents to the right superficial femoral popliteal arteries, hypertension, hyperlipidemia, BPH, CKD 3A with baseline creatinine 1.3-1.6, who is admitted to [facility] on 01/03/2021 with partial arterial occlusion in the right lower extremity after presenting from home to [facility] ED complaining of right lower extremity pain.    Principal Problem:   Arterial occlusion due to arteriosclerosis Active Problems:   Essential hypertension   Claudication of right lower extremity (HCC)   Leukocytosis   Hyperlipemia   Peripheral arterial disease ([place])     #) partial arterial occlusion in right lower extremity: In the context of a documented history of b/l peripheral artery disease status post previous right superficial femoral and popliteal artery stents followed by femoropopliteal bypass, the patient presents with 1 day of right calf/foot claudication symptoms associated with new onset numbness and white appearance with ensuing spontaneous resolution with rest suggestive of partial arterial occlusion of the right lower extremity, consistent with CT angiography findings as further detailed above.  At this time, patient denies any residual right lower extremity/right foot  pain/claudication, and also denies any residual numbness paresthesias involving the right lower extremity and reports that the coloration associated with his RLE is a baseline.  Suspect multifactorial contributions new onset claudication symptoms, including a history of suboptimal compliance with the patient acknowledging that he is limited in taking any Plavix over at least the last 2 months, with suspected additional contribution from supply demand mismatch as a consequence of relative dehydration and intravascular depletion in the setting of patient's recent extended work in the hot ambient temperatures without compensatory increase in water intake.  Denies any recent trauma, and confirms that he is a former smoker, completely quit smoking in 2019.   Case was discussed with the on-call vascular surgeon, Dr.Bragham, who recommended admission to the hospital service for further evaluation and management of presenting partial arterial occlusion of the right lower extremity. Dr. Marion Downer will evaluate the patient in person tomorrow morning, with recommendation for interval administration of heparin drip, and tentative plan for arteriogram on Monday (01/05/21).  Heparin drip following initial bolus per admission pharmacy initiated in the  ED today.   Plan: Continue heparin drip.  Vascular surgery formally consulted, as above.  Counseled the patient on the importance of improved compliance with his home medications, including Plavix and atorvastatin moving forward.  Holding home Plavix while on heparin drip.  Resume home atorvastatin.  Close monitoring of ensuing blood pressure via routine vital signs.  Serial neurovascular checks of the RLE.  PTT/INR.  Repeat CBC in the morning.       #) Leukocytosis: Presenting CBC approximately elevated white blood cell count 14,000.  No evidence of underlying infectious process at this time, although COVID-19 screen result is currently pending.  Will also check urinalysis  to further evaluate.  Does not meet SIRS criteria for sepsis.  Rather, suspect contribution to mildly elevated white blood cell count on the basis of hemoconcentration as a consequence of intravascular depletion dehydration, as further detailed above, as well as inflammatory contribution in response to presenting claudication symptoms as above.   Plan: Normal saline 75 cc/h x 12 hours.  Monitor strict I's and O's and daily weights.  Repeat  CBC in the AM.  Check urinalysis.  Follow for results of COVID-19 pcr.        #) Essential hypertension: The patient knowledges a history of hypertension, but also conveys that he has been suboptimally compliant with his home losartan/HCTZ, noting that he has not taken any doses of these and hypertensive medications over the last few months, and conveys baseline systolic blood pressures in the 150s to 160s over that timeframe.  Systolic blood pressure is in the ED are currently running in the 140s to 160s mmHg while perfusing right lower extremity. While the patient has been encouraged to improve compliance with his home and hypertensive medications as an outpatient, will refrain from aggressive anti-hypertensive intervention overnight to reduce risk of iatrogenic hypoperfusion of the right lower extremity as a consequence of potential supply demand mismatch implications with aggressive blood pressure control, particular the context of suspected element of intravascular volume depletion as result of recent dehydration, as further detailed above.   Plan: Resume home Flomax, while holding HCTZ and losartan, as further detailed above.  Close monitoring ensuing blood pressure via routine vital signs.      #) Hyperlipidemia: Documented history of such, with the patient being suboptimal compliance with home atorvastatin.  Particular in the setting of his history of peripheral artery disease, the patient was counseled on the importance of improved compliance with his  home statin.   Plan: Resume home atorvastatin.  Counseled the patient on importance of improved compliance with home antilipid regimen.       #) BPH: Documented history of such, the patient reported good compliance on home Flomax.  Plan: Resume home Flomax.  Monitor strict I's and O's Daily weights.  Repeat BMP in the morning.        #) Stage IIIa chronic kidney disease: Documented history of such, with baseline creatinine range of 1.3-1.6.  Presenting labs reflect serum creatinine consistent with his baseline range.  While criteria are not met for acute kidney injury, the patient clinically appears dehydrated, including with the appearance of dry oral mucosa membranes in the context of recent work outside in the hot ambient temperatures. Will provide gentle IVF's overnight, with close monitoring of ensuing renal function and volume status.  Plan: Monitor strict I's and O's and attempt avoid nephrotoxic agents.  Normal saline at 75 cc/h x 12 hours.  Repeat BMP in the morning.  DVT prophylaxis: Heparin drip, as above Code Status: Full code Family Communication: none Disposition Plan: Per Rounding Team Consults called: cased discussed with on-call vascular surgeon, dr. Marion Downer, as further detailed above;  Admission status: Inpatient     Of note, this patient was added by me to the following Admit List/Treatment Team: armcadmits.      PLEASE NOTE THAT DRAGON DICTATION SOFTWARE WAS USED IN THE CONSTRUCTION OF THIS NOTE.   [place] [facility] Pager [phone] From [place]  Otherwise, please contact night-coverage  [email or web address] Password [facility]   01/03/2021, 7:36 PM

## 2021-01-03 NOTE — ED Triage Notes (Signed)
Pt states that a couple hours ago he started having pain in his R leg- pt states he thought it was a charley horse but it would not go away- pt states it is swollen and that his foot is numb

## 2021-01-03 NOTE — ED Notes (Signed)
Pt taken to CT via stretcher.

## 2021-01-03 NOTE — ED Notes (Signed)
Patient given urinal at this time.

## 2021-01-04 DIAGNOSIS — I7090 Unspecified atherosclerosis: Secondary | ICD-10-CM

## 2021-01-04 LAB — MAGNESIUM: Magnesium: 2.3 mg/dL (ref 1.7–2.4)

## 2021-01-04 LAB — HEPARIN LEVEL (UNFRACTIONATED)
Heparin Unfractionated: 0.81 IU/mL — ABNORMAL HIGH (ref 0.30–0.70)
Heparin Unfractionated: 0.56 IU/mL (ref 0.30–0.70)

## 2021-01-04 LAB — COMPREHENSIVE METABOLIC PANEL
ALT: 19 U/L (ref 0–44)
AST: 30 U/L (ref 15–41)
Albumin: 4 g/dL (ref 3.5–5.0)
Alkaline Phosphatase: 58 U/L (ref 38–126)
Anion gap: 4 — ABNORMAL LOW (ref 5–15)
BUN: 20 mg/dL (ref 8–23)
CO2: 24 mmol/L (ref 22–32)
Calcium: 8.4 mg/dL — ABNORMAL LOW (ref 8.9–10.3)
Chloride: 106 mmol/L (ref 98–111)
Creatinine, Ser: 1.26 mg/dL — ABNORMAL HIGH (ref 0.61–1.24)
GFR, Estimated: 59 mL/min — ABNORMAL LOW (ref >60)
Glucose, Bld: 96 mg/dL (ref 70–99)
Potassium: 3.4 mmol/L — ABNORMAL LOW (ref 3.5–5.1)
Sodium: 134 mmol/L — ABNORMAL LOW (ref 135–145)
Total Bilirubin: 1 mg/dL (ref 0.3–1.2)
Total Protein: 7 g/dL (ref 6.5–8.1)

## 2021-01-04 LAB — CBC
HCT: 44.8 % (ref 39.0–52.0)
Hemoglobin: 15.5 g/dL (ref 13.0–17.0)
MCH: 31.1 pg (ref 26.0–34.0)
MCHC: 34.6 g/dL (ref 30.0–36.0)
MCV: 90 fL (ref 80.0–100.0)
Platelets: 236 10*3/uL (ref 150–400)
RBC: 4.98 MIL/uL (ref 4.22–5.81)
RDW: 13.7 % (ref 11.5–15.5)
WBC: 8.6 10*3/uL (ref 4.0–10.5)
nRBC: 0 % (ref 0.0–0.2)

## 2021-01-04 MED ORDER — AMLODIPINE BESYLATE 5 MG PO TABS
5.0000 mg | ORAL_TABLET | Freq: Every day | ORAL | Status: DC
  Administered 2021-01-04 – 2021-01-06 (×3): 5 mg via ORAL
  Filled 2021-01-04 (×3): qty 1

## 2021-01-04 MED ORDER — POTASSIUM CHLORIDE CRYS ER 20 MEQ PO TBCR
40.0000 meq | EXTENDED_RELEASE_TABLET | Freq: Once | ORAL | Status: AC
  Administered 2021-01-04: 40 meq via ORAL
  Filled 2021-01-04: qty 2

## 2021-01-04 NOTE — Consult Note (Signed)
[facility] [place]  Patient name: Marvin Williams. MRN: [id number] DOB: 04-Jan-1946 Sex: male   REQUESTING PROVIDER:    ER   REASON FOR CONSULT:    Right leg pain  HISTORY OF PRESENT ILLNESS:   Sadik Piascik. is a 75 y.o. male, who initially underwent angiography in December 2019 for bilateral claudication.  He had angioplasty and stenting of the right superficial femoral and popliteal artery with covered stent placement.  On 07/30/2019 he had angioplasty of the left external iliac artery and mechanical thrombectomy of the right superficial femoral and popliteal artery with covered stent placement.  This was done secondary to reduced flow by noninvasive imaging.  He presented to the emergency department on 01/03/2021 with acute onset of pain and cramping in the right leg.  He noted a more white discoloration of the right leg.  He is not having any problems on the left.  The patient suffers from hypertension.  He takes a statin for hypercholesterolemia.  He has intermittently been taking his Plavix.  He is a former smoker.  PAST MEDICAL HISTORY    Past Medical History:  Diagnosis Date   Cancer ([place])    skin   HTN (hypertension)    Hyperlipemia    Lung cancer ([place])    Peripheral arterial disease ([place])      FAMILY HISTORY   Family History  Problem Relation Age of Onset   Cancer Mother    Cancer Father    Cancer Sister     SOCIAL HISTORY:   Social History   Socioeconomic History   Marital status: Married    Spouse name: Not on file   Number of children: Not on file   Years of education: Not on file   Highest education level: Not on file  Occupational History   Not on file  Tobacco Use   Smoking status: Former   Smokeless tobacco: Never   Tobacco comments:    Last cigarette on 06/04/17  Vaping Use   Vaping Use: Never used  Substance and Sexual Activity   Alcohol use: No   Drug use: No   Sexual  activity: Not on file  Other Topics Concern   Not on file  Social History Narrative   Not on file   Social Determinants of Health   Financial Resource Strain: Not on file  Food Insecurity: Not on file  Transportation Needs: Not on file  Physical Activity: Not on file  Stress: Not on file  Social Connections: Not on file  Intimate Partner Violence: Not on file    ALLERGIES:    Allergies  Allergen Reactions   Naproxen Other (See Comments)    Per patient: oncologist prefers he does NOT take     CURRENT MEDICATIONS:    Current Facility-Administered Medications  Medication Dose Route Frequency Provider Last Rate Last Admin   acetaminophen (TYLENOL) tablet 650 mg  650 mg Oral Q6H PRN Howerter, Justin B, DO       Or   acetaminophen (TYLENOL) suppository 650 mg  650 mg Rectal Q6H PRN Howerter, Justin B, DO       amLODipine (NORVASC) tablet 5 mg  5 mg Oral Daily Amery, Sahar, MD   5 mg at 01/04/21 1024   atorvastatin (LIPITOR) tablet 10 mg  10 mg Oral QHS Howerter, Justin B, DO   10 mg at 01/03/21 2240   gabapentin (NEURONTIN) capsule 100 mg  100 mg Oral Daily Howerter, Justin B,  DO   100 mg at 01/04/21 1025   heparin ADULT infusion 100 units/mL (25000 units/266mL)  1,250 Units/hr Intravenous Continuous Nolberto Hanlon, MD 12.5 mL/hr at 01/04/21 1028 1,250 Units/hr at 01/04/21 1028   tamsulosin (FLOMAX) capsule 0.4 mg  0.4 mg Oral Daily Howerter, Justin B, DO   0.4 mg at 01/04/21 1025   traMADol (ULTRAM) tablet 50 mg  50 mg Oral Q8H PRN Howerter, Justin B, DO   50 mg at 01/04/21 0726   Current Outpatient Medications  Medication Sig Dispense Refill   clopidogrel (PLAVIX) 75 MG tablet TAKE 1 TABLET(75 MG) BY MOUTH DAILY 90 tablet 3   tamsulosin (FLOMAX) 0.4 MG CAPS capsule Take 1 capsule (0.4 mg total) by mouth daily. 90 capsule 1   atorvastatin (LIPITOR) 10 MG tablet TAKE 1 TABLET BY MOUTH DAILY 30 tablet 11   gabapentin (NEURONTIN) 100 MG capsule Take 100 mg by mouth daily.      hydrochlorothiazide (HYDRODIURIL) 12.5 MG tablet Take 12.5 mg by mouth daily. (Patient not taking: Reported on 01/03/2021)     losartan-hydrochlorothiazide (HYZAAR) 100-12.5 MG tablet Take 1 tablet by mouth daily. (Patient not taking: Reported on 01/03/2021)     traMADol (ULTRAM) 50 MG tablet Take 1 tablet (50 mg total) by mouth every 8 (eight) hours as needed. (Patient not taking: Reported on 01/03/2021) 10 tablet 0    REVIEW OF SYSTEMS:   [X]  denotes positive finding, [ ]  denotes negative finding Cardiac  Comments:  Chest pain or chest pressure:    Shortness of breath upon exertion:    Short of breath when lying flat:    Irregular heart rhythm:        Vascular    Pain in calf, thigh, or hip brought on by ambulation: x   Pain in feet at night that wakes you up from your sleep:  x   Blood clot in your veins:    Leg swelling:         Pulmonary    Oxygen at home:    Productive cough:     Wheezing:         Neurologic    Sudden weakness in arms or legs:     Sudden numbness in arms or legs:     Sudden onset of difficulty speaking or slurred speech:    Temporary loss of vision in one eye:     Problems with dizziness:         Gastrointestinal    Blood in stool:      Vomited blood:         Genitourinary    Burning when urinating:     Blood in urine:        Psychiatric    Major depression:         Hematologic    Bleeding problems:    Problems with blood clotting too easily:        Skin    Rashes or ulcers:        Constitutional    Fever or chills:     PHYSICAL EXAM:   Vitals:   01/04/21 1100 01/04/21 1200 01/04/21 1500 01/04/21 1600  BP: (!) 144/76 (!) 154/87 136/87 134/87  Pulse: 82 76 84 84  Resp: 19 19 (!) 25 (!) 21  Temp:      TempSrc:      SpO2: 94% 94% 94% 93%  Weight:      Height:        GENERAL: The patient is  a well-nourished male, in no acute distress. The vital signs are documented above. CARDIAC: There is a regular rate and rhythm.  VASCULAR:  Palpable femoral pulses bilaterally.  Pedal and popliteal pulses are not palpable on the right PULMONARY: Nonlabored respirations ABDOMEN: Soft and non-tender with normal pitched bowel sounds.  MUSCULOSKELETAL: There are no major deformities or cyanosis. NEUROLOGIC: No motor or sensory deficits SKIN: There are no ulcers or rashes noted. PSYCHIATRIC: The patient has a normal affect.  STUDIES:   I have reviewed his CT scan with the following findings: 1. There is long segment occlusion of the right superficial femoral and popliteal arteries from the origin, including total occlusion of a superficial femoral and popliteal artery stents. There is some intermittent reconstitution of the trifurcation vessels in the right lower extremity, presumably via geniculate collaterals. 2. 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 3. 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 The left popliteal artery is almost completely occluded across the level of the knee joint, and reconstituted below the knee joint by geniculate collaterals. There is intermittent reconstitution of the trifurcation vessels via geniculate collateral flow. 4. Aortic atherosclerosis. 5. No acute findings in the abdomen or pelvis on early arterial phase examination.  ASSESSMENT and PLAN   Lower extremity atherosclerotic vascular disease with rest pain on the right: The patient's stents in his right superficial femoral artery are occluded which is causing him his discomfort.  He now has rest pain.  He has been admitted for IV heparin.  He does not have motor or sensory function loss.  There is no  mottling on his foot.  I discussed proceeding with angiography to define his anatomy and see if percutaneous intervention can be performed.  If not, he would require surgical revascularization.  He will be n.p.o. after midnight in anticipation of a arteriogram tomorrow.  All questions were answered, the patient wishes to proceed.   Leia Alf, MD, FACS [facility] [phone] Pager [phone]

## 2021-01-04 NOTE — Consult Note (Signed)
ANTICOAGULATION CONSULT NOTE - Initial Consult  Pharmacy Consult for heparin infusion Indication: acute superficial arterial occlusion   Allergies  Allergen Reactions   Naproxen Other (See Comments)    Per patient: oncologist prefers he does NOT take     Patient Measurements: Height: 5\' 8"  (172.7 cm) Weight: 79.8 kg (176 lb) IBW/kg (Calculated) : 68.4   Vital Signs: BP: 134/87 (08/07 1600) Pulse Rate: 84 (08/07 1600)  Labs: Recent Labs    01/03/21 1634 01/03/21 1948 01/04/21 0603 01/04/21 1616  HGB 15.8  --  15.5  --   HCT 46.1  --  44.8  --   PLT 228  --  236  --   APTT  --  37*  --   --   LABPROT  --  13.7  --   --   INR  --  1.0  --   --   HEPARINUNFRC  --   --  0.81* 0.56  CREATININE 1.56*  --  1.26*  --   TROPONINIHS 9  --   --   --      Estimated Creatinine Clearance: 48.3 mL/min (A) (by C-G formula based on SCr of 1.26 mg/dL (H)).   Medical History: Past Medical History:  Diagnosis Date   Cancer ([place])    skin   HTN (hypertension)    Hyperlipemia    Lung cancer ([place])    Peripheral arterial disease ([facility])     Medications:  No prior anticoagulation noted  Per wife, has not been taking antiplatelets in over a month  Assessment: 75 y.o. male  with hx of PAD presents with acute onset right leg pain/swelling. Per family, stopped taking all this medications including antiplatelets over a month ago. CT showing superficial femoral and popliteal ateria occlusion. Pharmacy has been consulted for heparin infusion  8/7 0603 HL = 0.81 1400 > 1250 8/7 1616 HL = 0.56 therapeutic x 1   Goal of Therapy:  Heparin level 0.3-0.7 units/ml Monitor platelets by anticoagulation protocol: Yes   Plan:  8/7 1616 HL = 0.56 therapeutic x 1  Continue heparin infusion at 1250 units/hr  Recheck confirmatory in HL 8 hrs  CBC daily   Dorothe Pea, PharmD, BCPS Clinical Pharmacist   01/04/2021,4:46 PM

## 2021-01-04 NOTE — Consult Note (Signed)
ANTICOAGULATION CONSULT NOTE - Initial Consult  Pharmacy Consult for heparin infusion Indication: acute superficial arterial occlusion   Allergies  Allergen Reactions   Naproxen Other (See Comments)    Per patient: oncologist prefers he does NOT take     Patient Measurements: Height: 5\' 8"  (172.7 cm) Weight: 79.8 kg (176 lb) IBW/kg (Calculated) : 68.4   Vital Signs: BP: 150/79 (08/07 0400) Pulse Rate: 64 (08/07 0400)  Labs: Recent Labs    01/03/21 1634 01/03/21 1948 01/04/21 0603  HGB 15.8  --  15.5  HCT 46.1  --  44.8  PLT 228  --  236  APTT  --  37*  --   LABPROT  --  13.7  --   INR  --  1.0  --   HEPARINUNFRC  --   --  0.81*  CREATININE 1.56*  --   --   TROPONINIHS 9  --   --      Estimated Creatinine Clearance: 39 mL/min (A) (by C-G formula based on SCr of 1.56 mg/dL (H)).   Medical History: Past Medical History:  Diagnosis Date   Cancer ([place])    skin   HTN (hypertension)    Hyperlipemia    Lung cancer ([place])    Peripheral arterial disease ([facility])     Medications:  No prior anticoagulation noted  Per wife, has not been taking antiplatelets in over a month  Assessment: 75 y.o. male  with hx of PAD presents with acute onset right leg pain/swelling. Per family, stopped taking all this medications including antiplatelets over a month ago. CT showing superficial femoral and popliteal ateria occlusion. Pharmacy has been consulted for heparin infusion  Goal of Therapy:  Heparin level 0.3-0.7 units/ml Monitor platelets by anticoagulation protocol: Yes   Plan:  8/7:  HL @ 0603 = 0.81 Will decrease heparin infusion to 1250 units/hr and recheck HL 8 hrs after rate change.   Orene Desanctis, PharmD Clinical Pharmacist   01/04/2021,6:44 AM

## 2021-01-04 NOTE — Progress Notes (Signed)
PROGRESS NOTE    Marvin Williams.  PJA:[id number] DOB: May 03, 1946 DOA: 01/03/2021 PCP: Adin Hector, MD    Brief Narrative:  Marvin Hohensee. is a 75 y.o. male with medical history significant for peripheral artery disease status post stents to the right superficial femoral popliteal arteries, hypertension, hyperlipidemia, BPH, CKD 3A with baseline creatinine 1.3-1.6, who is admitted to [facility] on 01/03/2021 with partial arterial occlusion in the right lower extremity after presenting from home to [facility] ED complaining of right lower extremity pain  CT angiography of the abdominal aorta with bilateral iliofemoral runoff was notable for the following, with additional results as documented below: long segment occlusion of the right superficial femoral and popliteal arteries from the origin, including total occlusion of a superficial femoral and popliteal artery stents. There is some intermittent reconstitution of the trifurcation vessels in the right lower extremity, presumably via geniculate collaterals  Consultants:  Vascular surgery  Procedures:   Antimicrobials:      Subjective: Feels right foot is cool.  Mild pain.  No other complaints.  No chest pain or shortness of breath  Objective: Vitals:   01/04/21 1100 01/04/21 1200 01/04/21 1500 01/04/21 1600  BP: (!) 144/76 (!) 154/87 136/87 134/87  Pulse: 82 76 84 84  Resp: 19 19 (!) 25 (!) 21  Temp:      TempSrc:      SpO2: 94% 94% 94% 93%  Weight:      Height:        Intake/Output Summary (Last 24 hours) at 01/04/2021 1635 Last data filed at 01/04/2021 0723 Gross per 24 hour  Intake 1000 ml  Output --  Net 1000 ml   Filed Weights   01/03/21 1603  Weight: 79.8 kg    Examination:  General exam: Appears calm and comfortable  Respiratory system: Clear to auscultation. Respiratory effort normal. Cardiovascular system: S1 & S2 heard, RRR. No gallops  Gastrointestinal system: Abdomen is  nondistended, soft and nontender. Normal bowel sounds heard. Central nervous system: Alert and oriented. No focal neurological deficits. Extremities: No edema, right lower extremity cool Psychiatry: Judgement and insight appear normal. Mood & affect appropriate.     Data Reviewed: I have personally reviewed following labs and imaging studies  CBC: Recent Labs  Lab 01/03/21 1634 01/04/21 0603  WBC 14.2* 8.6  NEUTROABS 12.3*  --   HGB 15.8 15.5  HCT 46.1 44.8  MCV 89.0 90.0  PLT 228 341   Basic Metabolic Panel: Recent Labs  Lab 01/03/21 1634 01/03/21 1948 01/04/21 0603  NA 137  --  134*  K 3.8  --  3.4*  CL 103  --  106  CO2 25  --  24  GLUCOSE 112*  --  96  BUN 23  --  20  CREATININE 1.56*  --  1.26*  CALCIUM 9.1  --  8.4*  MG  --  1.5* 2.3   GFR: Estimated Creatinine Clearance: 48.3 mL/min (A) (by C-G formula based on SCr of 1.26 mg/dL (H)). Liver Function Tests: Recent Labs  Lab 01/03/21 1634 01/04/21 0603  AST 22 30  ALT 18 19  ALKPHOS 65 58  BILITOT 0.8 1.0  PROT 7.5 7.0  ALBUMIN 4.1 4.0   No results for input(s): LIPASE, AMYLASE in the last 168 hours. No results for input(s): AMMONIA in the last 168 hours. Coagulation Profile: Recent Labs  Lab 01/03/21 1948  INR 1.0   Cardiac Enzymes: No results for input(s):  CKTOTAL, CKMB, CKMBINDEX, TROPONINI in the last 168 hours. BNP (last 3 results) No results for input(s): PROBNP in the last 8760 hours. HbA1C: No results for input(s): HGBA1C in the last 72 hours. CBG: No results for input(s): GLUCAP in the last 168 hours. Lipid Profile: No results for input(s): CHOL, HDL, LDLCALC, TRIG, CHOLHDL, LDLDIRECT in the last 72 hours. Thyroid Function Tests: No results for input(s): TSH, T4TOTAL, FREET4, T3FREE, THYROIDAB in the last 72 hours. Anemia Panel: No results for input(s): VITAMINB12, FOLATE, FERRITIN, TIBC, IRON, RETICCTPCT in the last 72 hours. Sepsis Labs: Recent Labs  Lab 01/03/21 1634   LATICACIDVEN 1.2    Recent Results (from the past 240 hour(s))  Resp Panel by RT-PCR (Flu A&B, Covid) Nasopharyngeal Swab     Status: None   Collection Time: 01/03/21  7:48 PM   Specimen: Nasopharyngeal Swab; Nasopharyngeal(NP) swabs in vial transport medium  Result Value Ref Range Status   SARS Coronavirus 2 by RT PCR NEGATIVE NEGATIVE Final    Comment: (NOTE) SARS-CoV-2 target nucleic acids are NOT DETECTED.  The SARS-CoV-2 RNA is generally detectable in upper respiratory specimens during the acute phase of infection. The lowest concentration of SARS-CoV-2 viral copies this assay can detect is 138 copies/mL. A negative result does not preclude SARS-Cov-2 infection and should not be used as the sole basis for treatment or other patient management decisions. A negative result may occur with  improper specimen collection/handling, submission of specimen other than nasopharyngeal swab, presence of viral mutation(s) within the areas targeted by this assay, and inadequate number of viral copies(<138 copies/mL). A negative result must be combined with clinical observations, patient history, and epidemiological information. The expected result is Negative.  Fact Sheet for Patients:  EntrepreneurPulse.com.au  Fact Sheet for Healthcare Providers:  IncredibleEmployment.be  This test is no t yet approved or cleared by the [place] FDA and  has been authorized for detection and/or diagnosis of SARS-CoV-2 by FDA under an Emergency Use Authorization (EUA). This EUA will remain  in effect (meaning this test can be used) for the duration of the COVID-19 declaration under Section 564(b)(1) of the Act, 21 U.S.C.section 360bbb-3(b)(1), unless the authorization is terminated  or revoked sooner.       Influenza A by PCR NEGATIVE NEGATIVE Final   Influenza B by PCR NEGATIVE NEGATIVE Final    Comment: (NOTE) The Xpert Xpress SARS-CoV-2/FLU/RSV plus  assay is intended as an aid in the diagnosis of influenza from Nasopharyngeal swab specimens and should not be used as a sole basis for treatment. Nasal washings and aspirates are unacceptable for Xpert Xpress SARS-CoV-2/FLU/RSV testing.  Fact Sheet for Patients: EntrepreneurPulse.com.au  Fact Sheet for Healthcare Providers: IncredibleEmployment.be  This test is not yet approved or cleared by the [place] FDA and has been authorized for detection and/or diagnosis of SARS-CoV-2 by FDA under an Emergency Use Authorization (EUA). This EUA will remain in effect (meaning this test can be used) for the duration of the COVID-19 declaration under Section 564(b)(1) of the Act, 21 U.S.C. section 360bbb-3(b)(1), unless the authorization is terminated or revoked.  Performed at [facility], [address] [place], [place] [address]          Radiology Studies: CT Angio Aortobifemoral W and/or Wo Contrast  Result Date: 01/03/2021 CLINICAL DATA:  Right toe ischemia, history of previous revascularization, right lower lobe lung cancer EXAM: CT ANGIOGRAPHY OF ABDOMINAL AORTA WITH ILIOFEMORAL RUNOFF TECHNIQUE: Multidetector CT imaging of the abdomen, pelvis and lower extremities was  performed using the standard protocol during bolus administration of intravenous contrast. Multiplanar CT image reconstructions and MIPs were obtained to evaluate the vascular anatomy. CONTRAST:  113mL OMNIPAQUE IOHEXOL 350 MG/ML SOLN COMPARISON:  None. FINDINGS: VASCULAR Severe mixed calcific aortic atherosclerosis. No evidence of aneurysm, dissection, or other acute aortic pathology. 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 Atherosclerosis at the aortic branch vessel origins without high-grade stenosis. RIGHT Lower Extremity Inflow: Common, internal and external iliac arteries are  patent without evidence of aneurysm, dissection, vasculitis or significant stenosis. Moderate to severe mixed calcific atherosclerosis. Mild tortuosity. Outflow: There is long segment occlusion of the right superficial femoral and popliteal arteries from the origin, including total occlusion of a superficial femoral arterial and popliteal artery stent. The profunda femoris is patent. Runoff: There is some intermittent reconstitution of the trifurcation vessels in the right lower extremity, presumably via geniculate collaterals. LEFT Lower Extremity Inflow: Common and external iliac arteries are patent without evidence of aneurysm, dissection, vasculitis or significant stenosis. 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 Moderate to severe mixed calcific atherosclerosis. Mild tortuosity. Outflow: Common, superficial and profunda femoral arteries are patent without evidence of aneurysm, dissection, vasculitis or significant stenosis. The superficial femoral artery terminates in geniculate collaterals, and the popliteal artery and trifurcation vessels are supplied by a persistent left sciatic artery. The left popliteal artery is almost completely occluded across the level of the knee joint, and reconstituted below the knee joint by geniculate collaterals. Runoff: There is intermittent reconstitution of the trifurcation vessels via geniculate collateral flow. Veins: No obvious venous abnormality within the limitations of this arterial phase study. Review of the MIP images confirms the above findings. NON-VASCULAR Lower chest: No acute abnormality. Hepatobiliary: No focal liver abnormality is seen. No gallstones, gallbladder wall thickening, or biliary dilatation. Pancreas: Unremarkable. No pancreatic ductal dilatation or surrounding  inflammatory changes. Spleen: Normal in size without focal abnormality. Adrenals/Urinary Tract: Adrenal glands are unremarkable. Multiple bilateral renal cortical and parapelvic cysts. Kidneys are otherwise normal, without renal calculi, solid lesion, or hydronephrosis. Bladder is unremarkable. Stomach/Bowel: Stomach is within normal limits. Appendix appears normal. No evidence of bowel wall thickening, distention, or inflammatory changes. Lymphatic: No significant vascular findings are present. No enlarged abdominal or pelvic lymph nodes. Reproductive: Uterus and bilateral adnexa are unremarkable. Other: Small, fat containing bilateral inguinal hernias. No abdominopelvic ascites. Musculoskeletal: No acute or significant osseous findings. IMPRESSION: 1. There is long segment occlusion of the right superficial femoral and popliteal arteries from the origin, including total occlusion of a superficial femoral and popliteal artery stents. There is some intermittent reconstitution of the trifurcation vessels in the right lower extremity, presumably via geniculate collaterals. 2. 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 3. 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 The left popliteal artery is almost completely occluded across the level of the knee joint, and reconstituted below the knee joint by geniculate collaterals. There is intermittent reconstitution of the trifurcation vessels via geniculate collateral flow. 4. Aortic  atherosclerosis. 5. No acute findings in the abdomen or pelvis on early arterial phase examination. Aortic Atherosclerosis (ICD10-I70.0). Electronically Signed   By:  Eddie Candle M.D.   On: 01/03/2021 18:01        Scheduled Meds:  amLODipine  5 mg Oral Daily   atorvastatin  10 mg Oral QHS   gabapentin  100 mg Oral Daily   tamsulosin  0.4 mg Oral Daily   Continuous Infusions:  heparin 1,250 Units/hr (01/04/21 1028)    Assessment & Plan:   Principal Problem:   Arterial occlusion due to arteriosclerosis Active Problems:   Essential hypertension   Claudication of right lower extremity (HCC)   Leukocytosis   Hyperlipemia   Peripheral arterial disease ([place])   Marvin Fontan. is a 75 y.o. male with medical history significant for peripheral artery disease status post stents to the right superficial femoral popliteal arteries, hypertension, hyperlipidemia, BPH, CKD 3A with baseline creatinine 1.3-1.6, who is admitted to [facility] on 01/03/2021 with partial arterial occlusion in the right lower extremity after presenting from home to [facility] ED complaining of right lower extremity pain.   #) partial arterial occlusion in right lower extremity: In the context of a documented history of b/l peripheral artery disease status post previous right superficial femoral and popliteal artery stents followed by femoropopliteal bypass, the patient presents with 1 day of right calf/foot claudication symptoms associated with new onset numbness and white appearance with ensuing spontaneous resolution with rest suggestive of partial arterial occlusion of the right lower extremity, consistent with CT angiography findings as further detailed above.   8/7- vascular surgery consulted-pending, will f/u On heparin gtt.            #) Leukocytosis:  Reactive/stress induced. No infection found Wbc better Covid neg. UA negative            #) Essential hypertension:  Mildly elevated with add amlodipine        #) Hyperlipidemia:  Continue statins        #) BPH: Continue Flomax              #) Stage IIIa chronic kidney disease:    At baseline continue hydration if needs to go for intervention by vascular      DVT prophylaxis: Heparin drip Code Status: Full Family Communication: None at bedside Disposition Plan:  Status is: Inpatient  Remains inpatient appropriate because:Inpatient level of care appropriate due to severity of illness  Dispo: The patient is from: Home              Anticipated d/c is to: Home              Patient currently is not medically stable to d/c.   Difficult to place patient No            LOS: 1 day   Time spent: 35 minutes with more than 50% on [place], MD [facility] Pager [phone]  If 7PM-7AM, please contact night-coverage 01/04/2021, 4:35 PM

## 2021-01-04 NOTE — H&P (View-Only) (Signed)
[facility] [place]  Patient name: Marvin Williams. MRN: [id number] DOB: 1946-01-15 Sex: male   REQUESTING PROVIDER:    ER   REASON FOR CONSULT:    Right leg pain  HISTORY OF PRESENT ILLNESS:   Marvin Liby. is a 75 y.o. male, who initially underwent angiography in December 2019 for bilateral claudication.  He had angioplasty and stenting of the right superficial femoral and popliteal artery with covered stent placement.  On 07/30/2019 he had angioplasty of the left external iliac artery and mechanical thrombectomy of the right superficial femoral and popliteal artery with covered stent placement.  This was done secondary to reduced flow by noninvasive imaging.  He presented to the emergency department on 01/03/2021 with acute onset of pain and cramping in the right leg.  He noted a more white discoloration of the right leg.  He is not having any problems on the left.  The patient suffers from hypertension.  He takes a statin for hypercholesterolemia.  He has intermittently been taking his Plavix.  He is a former smoker.  PAST MEDICAL HISTORY    Past Medical History:  Diagnosis Date   Cancer ([place])    skin   HTN (hypertension)    Hyperlipemia    Lung cancer ([place])    Peripheral arterial disease ([place])      FAMILY HISTORY   Family History  Problem Relation Age of Onset   Cancer Mother    Cancer Father    Cancer Sister     SOCIAL HISTORY:   Social History   Socioeconomic History   Marital status: Married    Spouse name: Not on file   Number of children: Not on file   Years of education: Not on file   Highest education level: Not on file  Occupational History   Not on file  Tobacco Use   Smoking status: Former   Smokeless tobacco: Never   Tobacco comments:    Last cigarette on 06/04/17  Vaping Use   Vaping Use: Never used  Substance and Sexual Activity   Alcohol use: No   Drug use: No   Sexual  activity: Not on file  Other Topics Concern   Not on file  Social History Narrative   Not on file   Social Determinants of Health   Financial Resource Strain: Not on file  Food Insecurity: Not on file  Transportation Needs: Not on file  Physical Activity: Not on file  Stress: Not on file  Social Connections: Not on file  Intimate Partner Violence: Not on file    ALLERGIES:    Allergies  Allergen Reactions   Naproxen Other (See Comments)    Per patient: oncologist prefers he does NOT take     CURRENT MEDICATIONS:    Current Facility-Administered Medications  Medication Dose Route Frequency Provider Last Rate Last Admin   acetaminophen (TYLENOL) tablet 650 mg  650 mg Oral Q6H PRN Howerter, Justin B, DO       Or   acetaminophen (TYLENOL) suppository 650 mg  650 mg Rectal Q6H PRN Howerter, Justin B, DO       amLODipine (NORVASC) tablet 5 mg  5 mg Oral Daily Amery, Sahar, MD   5 mg at 01/04/21 1024   atorvastatin (LIPITOR) tablet 10 mg  10 mg Oral QHS Howerter, Justin B, DO   10 mg at 01/03/21 2240   gabapentin (NEURONTIN) capsule 100 mg  100 mg Oral Daily Howerter, Justin B,  DO   100 mg at 01/04/21 1025   heparin ADULT infusion 100 units/mL (25000 units/272mL)  1,250 Units/hr Intravenous Continuous Nolberto Hanlon, MD 12.5 mL/hr at 01/04/21 1028 1,250 Units/hr at 01/04/21 1028   tamsulosin (FLOMAX) capsule 0.4 mg  0.4 mg Oral Daily Howerter, Justin B, DO   0.4 mg at 01/04/21 1025   traMADol (ULTRAM) tablet 50 mg  50 mg Oral Q8H PRN Howerter, Justin B, DO   50 mg at 01/04/21 0726   Current Outpatient Medications  Medication Sig Dispense Refill   clopidogrel (PLAVIX) 75 MG tablet TAKE 1 TABLET(75 MG) BY MOUTH DAILY 90 tablet 3   tamsulosin (FLOMAX) 0.4 MG CAPS capsule Take 1 capsule (0.4 mg total) by mouth daily. 90 capsule 1   atorvastatin (LIPITOR) 10 MG tablet TAKE 1 TABLET BY MOUTH DAILY 30 tablet 11   gabapentin (NEURONTIN) 100 MG capsule Take 100 mg by mouth daily.      hydrochlorothiazide (HYDRODIURIL) 12.5 MG tablet Take 12.5 mg by mouth daily. (Patient not taking: Reported on 01/03/2021)     losartan-hydrochlorothiazide (HYZAAR) 100-12.5 MG tablet Take 1 tablet by mouth daily. (Patient not taking: Reported on 01/03/2021)     traMADol (ULTRAM) 50 MG tablet Take 1 tablet (50 mg total) by mouth every 8 (eight) hours as needed. (Patient not taking: Reported on 01/03/2021) 10 tablet 0    REVIEW OF SYSTEMS:   [X]  denotes positive finding, [ ]  denotes negative finding Cardiac  Comments:  Chest pain or chest pressure:    Shortness of breath upon exertion:    Short of breath when lying flat:    Irregular heart rhythm:        Vascular    Pain in calf, thigh, or hip brought on by ambulation: x   Pain in feet at night that wakes you up from your sleep:  x   Blood clot in your veins:    Leg swelling:         Pulmonary    Oxygen at home:    Productive cough:     Wheezing:         Neurologic    Sudden weakness in arms or legs:     Sudden numbness in arms or legs:     Sudden onset of difficulty speaking or slurred speech:    Temporary loss of vision in one eye:     Problems with dizziness:         Gastrointestinal    Blood in stool:      Vomited blood:         Genitourinary    Burning when urinating:     Blood in urine:        Psychiatric    Major depression:         Hematologic    Bleeding problems:    Problems with blood clotting too easily:        Skin    Rashes or ulcers:        Constitutional    Fever or chills:     PHYSICAL EXAM:   Vitals:   01/04/21 1100 01/04/21 1200 01/04/21 1500 01/04/21 1600  BP: (!) 144/76 (!) 154/87 136/87 134/87  Pulse: 82 76 84 84  Resp: 19 19 (!) 25 (!) 21  Temp:      TempSrc:      SpO2: 94% 94% 94% 93%  Weight:      Height:        GENERAL: The patient is  a well-nourished male, in no acute distress. The vital signs are documented above. CARDIAC: There is a regular rate and rhythm.  VASCULAR:  Palpable femoral pulses bilaterally.  Pedal and popliteal pulses are not palpable on the right PULMONARY: Nonlabored respirations ABDOMEN: Soft and non-tender with normal pitched bowel sounds.  MUSCULOSKELETAL: There are no major deformities or cyanosis. NEUROLOGIC: No motor or sensory deficits SKIN: There are no ulcers or rashes noted. PSYCHIATRIC: The patient has a normal affect.  STUDIES:   I have reviewed his CT scan with the following findings: 1. There is long segment occlusion of the right superficial femoral and popliteal arteries from the origin, including total occlusion of a superficial femoral and popliteal artery stents. There is some intermittent reconstitution of the trifurcation vessels in the right lower extremity, presumably via geniculate collaterals. 2. 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 3. 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 The left popliteal artery is almost completely occluded across the level of the knee joint, and reconstituted below the knee joint by geniculate collaterals. There is intermittent reconstitution of the trifurcation vessels via geniculate collateral flow. 4. Aortic atherosclerosis. 5. No acute findings in the abdomen or pelvis on early arterial phase examination.  ASSESSMENT and PLAN   Lower extremity atherosclerotic vascular disease with rest pain on the right: The patient's stents in his right superficial femoral artery are occluded which is causing him his discomfort.  He now has rest pain.  He has been admitted for IV heparin.  He does not have motor or sensory function loss.  There is no  mottling on his foot.  I discussed proceeding with angiography to define his anatomy and see if percutaneous intervention can be performed.  If not, he would require surgical revascularization.  He will be n.p.o. after midnight in anticipation of a arteriogram tomorrow.  All questions were answered, the patient wishes to proceed.   Leia Alf, MD, FACS [facility] [phone] Pager [phone]

## 2021-01-05 ENCOUNTER — Other Ambulatory Visit (INDEPENDENT_AMBULATORY_CARE_PROVIDER_SITE_OTHER): Payer: Self-pay | Admitting: Vascular Surgery

## 2021-01-05 ENCOUNTER — Encounter
Admission: EM | Disposition: A | Discharge status: Home / Self Care | Payer: Self-pay | Source: Home / Self Care | Attending: Internal Medicine

## 2021-01-05 ENCOUNTER — Encounter: Payer: Self-pay | Admitting: Vascular Surgery

## 2021-01-05 DIAGNOSIS — I70221 Atherosclerosis of native arteries of extremities with rest pain, right leg: Secondary | ICD-10-CM

## 2021-01-05 DIAGNOSIS — I743 Embolism and thrombosis of arteries of the lower extremities: Secondary | ICD-10-CM

## 2021-01-05 HISTORY — PX: LOWER EXTREMITY INTERVENTION: CATH118252

## 2021-01-05 LAB — CBC
HCT: 45.2 % (ref 39.0–52.0)
Hemoglobin: 15.4 g/dL (ref 13.0–17.0)
MCH: 30.6 pg (ref 26.0–34.0)
MCHC: 34.1 g/dL (ref 30.0–36.0)
MCV: 89.7 fL (ref 80.0–100.0)
Platelets: 230 10*3/uL (ref 150–400)
RBC: 5.04 MIL/uL (ref 4.22–5.81)
RDW: 13.9 % (ref 11.5–15.5)
WBC: 7.7 10*3/uL (ref 4.0–10.5)
nRBC: 0 % (ref 0.0–0.2)

## 2021-01-05 LAB — HEPARIN LEVEL (UNFRACTIONATED): Heparin Unfractionated: 0.18 IU/mL — ABNORMAL LOW (ref 0.30–0.70)

## 2021-01-05 SURGERY — LOWER EXTREMITY INTERVENTION
Anesthesia: Moderate Sedation | Laterality: Right

## 2021-01-05 MED ORDER — TIROFIBAN (AGGRASTAT) BOLUS VIA INFUSION: 25.0000 ug/kg | Freq: Once | INTRAVENOUS | Status: AC

## 2021-01-05 MED ORDER — FAMOTIDINE 20 MG PO TABS: 40.0000 mg | ORAL_TABLET | Freq: Once | ORAL | Status: DC | PRN

## 2021-01-05 MED ORDER — ALTEPLASE 1 MG/ML SYRINGE FOR VASCULAR PROCEDURE
INTRAMUSCULAR | Status: DC | PRN
  Administered 2021-01-05: 6 mg via INTRA_ARTERIAL

## 2021-01-05 MED ORDER — MIDAZOLAM HCL 2 MG/ML PO SYRP: 8.0000 mg | ORAL_SOLUTION | Freq: Once | ORAL | Status: DC | PRN

## 2021-01-05 MED ORDER — SODIUM CHLORIDE 0.9 % IV SOLN: INTRAVENOUS | Status: DC

## 2021-01-05 MED ORDER — HEPARIN SODIUM (PORCINE) 1000 UNIT/ML IJ SOLN
INTRAMUSCULAR | Status: DC | PRN
  Administered 2021-01-05: 5000 [IU] via INTRAVENOUS

## 2021-01-05 MED ORDER — DIPHENHYDRAMINE HCL 50 MG/ML IJ SOLN: 50.0000 mg | Freq: Once | INTRAMUSCULAR | Status: DC | PRN

## 2021-01-05 MED ORDER — CEFAZOLIN SODIUM-DEXTROSE 2-4 GM/100ML-% IV SOLN
INTRAVENOUS | Status: AC
  Administered 2021-01-05: 2 g via INTRAVENOUS
  Filled 2021-01-05: qty 100

## 2021-01-05 MED ORDER — ONDANSETRON HCL 4 MG/2ML IJ SOLN: 4.0000 mg | Freq: Four times a day (QID) | INTRAMUSCULAR | Status: DC | PRN

## 2021-01-05 MED ORDER — METHYLPREDNISOLONE SODIUM SUCC 125 MG IJ SOLR: 125.0000 mg | Freq: Once | INTRAMUSCULAR | Status: DC | PRN

## 2021-01-05 MED ORDER — ONDANSETRON HCL 4 MG/2ML IJ SOLN
INTRAMUSCULAR | Status: AC
  Administered 2021-01-05: 4 mg via INTRAVENOUS
  Filled 2021-01-05: qty 2

## 2021-01-05 MED ORDER — TIROFIBAN HCL IV 12.5 MG/250 ML
0.0750 ug/kg/min | INTRAVENOUS | Status: DC
  Administered 2021-01-05: 0.075 ug/kg/min via INTRAVENOUS
  Filled 2021-01-05: qty 250

## 2021-01-05 MED ORDER — FENTANYL CITRATE (PF) 100 MCG/2ML IJ SOLN
INTRAMUSCULAR | Status: DC | PRN
  Administered 2021-01-05: 50 ug via INTRAVENOUS
  Administered 2021-01-05 (×2): 25 ug via INTRAVENOUS

## 2021-01-05 MED ORDER — HEPARIN BOLUS VIA INFUSION
2000.0000 [IU] | Freq: Once | INTRAVENOUS | Status: AC
  Administered 2021-01-05: 2000 [IU] via INTRAVENOUS
  Filled 2021-01-05: qty 2000

## 2021-01-05 MED ORDER — HYDROMORPHONE HCL 1 MG/ML IJ SOLN: 1.0000 mg | Freq: Once | INTRAMUSCULAR | Status: DC | PRN

## 2021-01-05 MED ORDER — HEPARIN SODIUM (PORCINE) 1000 UNIT/ML IJ SOLN
INTRAMUSCULAR | Status: AC
  Filled 2021-01-05: qty 1

## 2021-01-05 MED ORDER — TIROFIBAN HCL IN NACL 5-0.9 MG/100ML-% IV SOLN: 0.0750 ug/kg/min | INTRAVENOUS | Status: DC

## 2021-01-05 MED ORDER — CEFAZOLIN SODIUM-DEXTROSE 2-4 GM/100ML-% IV SOLN
2.0000 g | Freq: Once | INTRAVENOUS | Status: AC
Start: 2021-01-06 — End: 2021-01-05

## 2021-01-05 MED ORDER — MIDAZOLAM HCL 2 MG/2ML IJ SOLN
INTRAMUSCULAR | Status: DC | PRN
  Administered 2021-01-05 (×2): 1 mg via INTRAVENOUS

## 2021-01-05 MED ORDER — ALTEPLASE 2 MG IJ SOLR
INTRAMUSCULAR | Status: AC
  Filled 2021-01-05: qty 6

## 2021-01-05 MED ORDER — IODIXANOL 320 MG/ML IV SOLN
INTRAVENOUS | Status: DC | PRN
  Administered 2021-01-05: 70 mL

## 2021-01-05 MED ORDER — MIDAZOLAM HCL 5 MG/5ML IJ SOLN
INTRAMUSCULAR | Status: AC
  Filled 2021-01-05: qty 5

## 2021-01-05 MED ORDER — TIROFIBAN HCL IV 12.5 MG/250 ML
INTRAVENOUS | Status: AC
  Administered 2021-01-05: 1995 ug via INTRAVENOUS
  Filled 2021-01-05: qty 250

## 2021-01-05 MED ORDER — CEFAZOLIN SODIUM-DEXTROSE 1-4 GM/50ML-% IV SOLN: 1.0000 g | Freq: Once | INTRAVENOUS | Status: DC

## 2021-01-05 MED ORDER — FENTANYL CITRATE (PF) 100 MCG/2ML IJ SOLN
INTRAMUSCULAR | Status: AC
  Filled 2021-01-05: qty 2

## 2021-01-05 SURGICAL SUPPLY — 20 items
BALLN LUTONIX 018 5X150X130 (BALLOONS) ×2
BALLN ULTRVRSE 2.5X300X150 (BALLOONS) ×2
BALLOON LUTONIX 018 5X150X130 (BALLOONS) ×1 IMPLANT
BALLOON ULTRVRSE 2.5X300X150 (BALLOONS) ×1 IMPLANT
CANISTER PENUMBRA ENGINE (MISCELLANEOUS) ×2 IMPLANT
CATH INDIGO CAT6 KIT (CATHETERS) ×2 IMPLANT
CATH PIG 70CM (CATHETERS) ×2 IMPLANT
CATH VERT 5X100 (CATHETERS) ×2 IMPLANT
COVER PROBE U/S 5X48 (MISCELLANEOUS) ×2 IMPLANT
DEVICE STARCLOSE SE CLOSURE (Vascular Products) ×2 IMPLANT
GLIDEWIRE ADV .035X260CM (WIRE) ×2 IMPLANT
KIT ENCORE 26 ADVANTAGE (KITS) ×2 IMPLANT
PACK ANGIOGRAPHY (CUSTOM PROCEDURE TRAY) ×2 IMPLANT
SHEATH BRITE TIP 5FRX11 (SHEATH) ×2 IMPLANT
SHEATH DESTIN RDC 6FR 45 (SHEATH) ×2 IMPLANT
STENT VIABAHN 6X250X120 (Permanent Stent) ×4 IMPLANT
SYR MEDRAD MARK 7 150ML (SYRINGE) ×2 IMPLANT
TUBING CONTRAST HIGH PRESS 72 (TUBING) ×2 IMPLANT
WIRE G V18X300CM (WIRE) ×2 IMPLANT
WIRE GUIDERIGHT .035X150 (WIRE) ×2 IMPLANT

## 2021-01-05 NOTE — Plan of Care (Signed)

## 2021-01-05 NOTE — Progress Notes (Signed)
PROGRESS NOTE    Marvin Williams.  AST:[id number] DOB: Jun 05, 1945 DOA: 01/03/2021 PCP: Adin Hector, MD    Brief Narrative:  Marvin Williams. is a 75 y.o. male with medical history significant for peripheral artery disease status post stents to the right superficial femoral popliteal arteries, hypertension, hyperlipidemia, BPH, CKD 3A with baseline creatinine 1.3-1.6, who is admitted to [facility] on 01/03/2021 with partial arterial occlusion in the right lower extremity after presenting from home to [facility] ED complaining of right lower extremity pain  CT angiography of the abdominal aorta with bilateral iliofemoral runoff was notable for the following, with additional results as documented below: long segment occlusion of the right superficial femoral and popliteal arteries from the origin, including total occlusion of a superficial femoral and popliteal artery stents. There is some intermittent reconstitution of the trifurcation vessels in the right lower extremity, presumably via geniculate collaterals  8/8- PLAN FOR angiography today  Consultants:  Vascular surgery  Procedures:   Antimicrobials:      Subjective: No cp, or sob  Objective: Vitals:   01/05/21 0100 01/05/21 0200 01/05/21 0413 01/05/21 0500  BP:  (!) 146/77 137/76   Pulse: 63 60 84   Resp: 18 13    Temp:   (!) 97.3 F (36.3 C)   TempSrc:      SpO2: 93% 96% 95%   Weight:    79.8 kg  Height:        Intake/Output Summary (Last 24 hours) at 01/05/2021 0756 Last data filed at 01/05/2021 0500 Gross per 24 hour  Intake 494.43 ml  Output --  Net 494.43 ml   Filed Weights   01/03/21 1603 01/05/21 0500  Weight: 79.8 kg 79.8 kg    Examination: Nad, calm Cta no w/r/r Regular S1-S2 no gallops Soft benign positive bowel  No edema aaxox4  Data Reviewed: I have personally reviewed following labs and imaging studies  CBC: Recent Labs  Lab 01/03/21 1634 01/04/21 0603 01/05/21 0508   WBC 14.2* 8.6 7.7  NEUTROABS 12.3*  --   --   HGB 15.8 15.5 15.4  HCT 46.1 44.8 45.2  MCV 89.0 90.0 89.7  PLT 228 236 989   Basic Metabolic Panel: Recent Labs  Lab 01/03/21 1634 01/03/21 1948 01/04/21 0603  NA 137  --  134*  K 3.8  --  3.4*  CL 103  --  106  CO2 25  --  24  GLUCOSE 112*  --  96  BUN 23  --  20  CREATININE 1.56*  --  1.26*  CALCIUM 9.1  --  8.4*  MG  --  1.5* 2.3   GFR: Estimated Creatinine Clearance: 48.3 mL/min (A) (by C-G formula based on SCr of 1.26 mg/dL (H)). Liver Function Tests: Recent Labs  Lab 01/03/21 1634 01/04/21 0603  AST 22 30  ALT 18 19  ALKPHOS 65 58  BILITOT 0.8 1.0  PROT 7.5 7.0  ALBUMIN 4.1 4.0   No results for input(s): LIPASE, AMYLASE in the last 168 hours. No results for input(s): AMMONIA in the last 168 hours. Coagulation Profile: Recent Labs  Lab 01/03/21 1948  INR 1.0   Cardiac Enzymes: No results for input(s): CKTOTAL, CKMB, CKMBINDEX, TROPONINI in the last 168 hours. BNP (last 3 results) No results for input(s): PROBNP in the last 8760 hours. HbA1C: No results for input(s): HGBA1C in the last 72 hours. CBG: No results for input(s): GLUCAP in the last 168 hours.  Lipid Profile: No results for input(s): CHOL, HDL, LDLCALC, TRIG, CHOLHDL, LDLDIRECT in the last 72 hours. Thyroid Function Tests: No results for input(s): TSH, T4TOTAL, FREET4, T3FREE, THYROIDAB in the last 72 hours. Anemia Panel: No results for input(s): VITAMINB12, FOLATE, FERRITIN, TIBC, IRON, RETICCTPCT in the last 72 hours. Sepsis Labs: Recent Labs  Lab 01/03/21 1634  LATICACIDVEN 1.2    Recent Results (from the past 240 hour(s))  Resp Panel by RT-PCR (Flu A&B, Covid) Nasopharyngeal Swab     Status: None   Collection Time: 01/03/21  7:48 PM   Specimen: Nasopharyngeal Swab; Nasopharyngeal(NP) swabs in vial transport medium  Result Value Ref Range Status   SARS Coronavirus 2 by RT PCR NEGATIVE NEGATIVE Final    Comment: (NOTE) SARS-CoV-2  target nucleic acids are NOT DETECTED.  The SARS-CoV-2 RNA is generally detectable in upper respiratory specimens during the acute phase of infection. The lowest concentration of SARS-CoV-2 viral copies this assay can detect is 138 copies/mL. A negative result does not preclude SARS-Cov-2 infection and should not be used as the sole basis for treatment or other patient management decisions. A negative result may occur with  improper specimen collection/handling, submission of specimen other than nasopharyngeal swab, presence of viral mutation(s) within the areas targeted by this assay, and inadequate number of viral copies(<138 copies/mL). A negative result must be combined with clinical observations, patient history, and epidemiological information. The expected result is Negative.  Fact Sheet for Patients:  EntrepreneurPulse.com.au  Fact Sheet for Healthcare Providers:  IncredibleEmployment.be  This test is no t yet approved or cleared by the [place] FDA and  has been authorized for detection and/or diagnosis of SARS-CoV-2 by FDA under an Emergency Use Authorization (EUA). This EUA will remain  in effect (meaning this test can be used) for the duration of the COVID-19 declaration under Section 564(b)(1) of the Act, 21 U.S.C.section 360bbb-3(b)(1), unless the authorization is terminated  or revoked sooner.       Influenza A by PCR NEGATIVE NEGATIVE Final   Influenza B by PCR NEGATIVE NEGATIVE Final    Comment: (NOTE) The Xpert Xpress SARS-CoV-2/FLU/RSV plus assay is intended as an aid in the diagnosis of influenza from Nasopharyngeal swab specimens and should not be used as a sole basis for treatment. Nasal washings and aspirates are unacceptable for Xpert Xpress SARS-CoV-2/FLU/RSV testing.  Fact Sheet for Patients: EntrepreneurPulse.com.au  Fact Sheet for Healthcare  Providers: IncredibleEmployment.be  This test is not yet approved or cleared by the [place] FDA and has been authorized for detection and/or diagnosis of SARS-CoV-2 by FDA under an Emergency Use Authorization (EUA). This EUA will remain in effect (meaning this test can be used) for the duration of the COVID-19 declaration under Section 564(b)(1) of the Act, 21 U.S.C. section 360bbb-3(b)(1), unless the authorization is terminated or revoked.  Performed at [facility], [address], [place], [place] [address]          Radiology Studies: CT Angio Aortobifemoral W and/or Wo Contrast  Result Date: 01/03/2021 CLINICAL DATA:  Right toe ischemia, history of previous revascularization, right lower lobe lung cancer EXAM: CT ANGIOGRAPHY OF ABDOMINAL AORTA WITH ILIOFEMORAL RUNOFF TECHNIQUE: Multidetector CT imaging of the abdomen, pelvis and lower extremities was performed using the standard protocol during bolus administration of intravenous contrast. Multiplanar CT image reconstructions and MIPs were obtained to evaluate the vascular anatomy. CONTRAST:  142mL OMNIPAQUE IOHEXOL 350 MG/ML SOLN COMPARISON:  None. FINDINGS: VASCULAR Severe mixed calcific aortic atherosclerosis. No evidence of  aneurysm, dissection, or other acute aortic pathology. 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 Atherosclerosis at the aortic branch vessel origins without high-grade stenosis. RIGHT Lower Extremity Inflow: Common, internal and external iliac arteries are patent without evidence of aneurysm, dissection, vasculitis or significant stenosis. Moderate to severe mixed calcific atherosclerosis. Mild tortuosity. Outflow: There is long segment occlusion of the right superficial femoral and popliteal arteries from the origin, including total occlusion of a superficial femoral arterial and popliteal  artery stent. The profunda femoris is patent. Runoff: There is some intermittent reconstitution of the trifurcation vessels in the right lower extremity, presumably via geniculate collaterals. LEFT Lower Extremity Inflow: Common and external iliac arteries are patent without evidence of aneurysm, dissection, vasculitis or significant stenosis. 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 Moderate to severe mixed calcific atherosclerosis. Mild tortuosity. Outflow: Common, superficial and profunda femoral arteries are patent without evidence of aneurysm, dissection, vasculitis or significant stenosis. The superficial femoral artery terminates in geniculate collaterals, and the popliteal artery and trifurcation vessels are supplied by a persistent left sciatic artery. The left popliteal artery is almost completely occluded across the level of the knee joint, and reconstituted below the knee joint by geniculate collaterals. Runoff: There is intermittent reconstitution of the trifurcation vessels via geniculate collateral flow. Veins: No obvious venous abnormality within the limitations of this arterial phase study. Review of the MIP images confirms the above findings. NON-VASCULAR Lower chest: No acute abnormality. Hepatobiliary: No focal liver abnormality is seen. No gallstones, gallbladder wall thickening, or biliary dilatation. Pancreas: Unremarkable. No pancreatic ductal dilatation or surrounding inflammatory changes. Spleen: Normal in size without focal abnormality. Adrenals/Urinary Tract: Adrenal glands are unremarkable. Multiple bilateral renal cortical and parapelvic cysts. Kidneys are otherwise normal, without renal calculi, solid lesion, or hydronephrosis. Bladder is unremarkable. Stomach/Bowel: Stomach is within normal limits.  Appendix appears normal. No evidence of bowel wall thickening, distention, or inflammatory changes. Lymphatic: No significant vascular findings are present. No enlarged abdominal or pelvic lymph nodes. Reproductive: Uterus and bilateral adnexa are unremarkable. Other: Small, fat containing bilateral inguinal hernias. No abdominopelvic ascites. Musculoskeletal: No acute or significant osseous findings. IMPRESSION: 1. There is long segment occlusion of the right superficial femoral and popliteal arteries from the origin, including total occlusion of a superficial femoral and popliteal artery stents. There is some intermittent reconstitution of the trifurcation vessels in the right lower extremity, presumably via geniculate collaterals. 2. 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 3. 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 The left popliteal artery is almost completely occluded across the level of the knee joint, and reconstituted below the knee joint by geniculate collaterals. There is intermittent reconstitution of the trifurcation vessels via geniculate collateral flow. 4. Aortic atherosclerosis. 5. No acute findings in the abdomen or pelvis on early arterial phase examination. Aortic Atherosclerosis (ICD10-I70.0). Electronically Signed   By: Eddie Candle M.D.   On: 01/03/2021 18:01        Scheduled Meds:  amLODipine  5  mg Oral Daily   atorvastatin  10 mg Oral QHS   gabapentin  100 mg Oral Daily   tamsulosin  0.4 mg Oral Daily   Continuous Infusions:  heparin 1,500 Units/hr (01/05/21 0328)    Assessment & Plan:   Principal Problem:   Arterial occlusion due to  arteriosclerosis Active Problems:   Essential hypertension   Claudication of right lower extremity (HCC)   Leukocytosis   Hyperlipemia   Peripheral arterial disease ([place])   Marvin Williams. is a 75 y.o. male with medical history significant for peripheral artery disease status post stents to the right superficial femoral popliteal arteries, hypertension, hyperlipidemia, BPH, CKD 3A with baseline creatinine 1.3-1.6, who is admitted to [facility] on 01/03/2021 with partial arterial occlusion in the right lower extremity after presenting from home to [facility] ED complaining of right lower extremity pain.   #) partial arterial occlusion in right lower extremity: In the context of a documented history of b/l peripheral artery disease status post previous right superficial femoral and popliteal artery stents followed by femoropopliteal bypass, the patient presents with 1 day of right calf/foot claudication symptoms associated with new onset numbness and white appearance with ensuing spontaneous resolution with rest suggestive of partial arterial occlusion of the right lower extremity, consistent with CT angiography findings as further detailed above.   8/8 plan for angiography today On heparin drip            #) Leukocytosis:  Reactive/stress induced. 8/8 no infection found, WBC better COVID-negative UA negative             #) Essential hypertension:  Better continue current regimen         #) Hyperlipidemia:  Continue statins       #) BPH: Continue Flomax              #) Stage IIIa chronic kidney disease:   At baseline continue hydration if needs to go for intervention by vascular      DVT prophylaxis: Heparin drip Code Status: Full Family Communication: None at bedside Disposition Plan:  Status is: Inpatient  Remains inpatient appropriate because:Inpatient level of care appropriate due to severity of illness  Dispo: The patient is from:  Home              Anticipated d/c is to: Home              Patient currently is not medically stable to d/c.   Difficult to place patient No  Disposition plan for angiography today          LOS: 2 days   Time spent: 35 minutes with more than 50% on [place], MD [facility] Pager [phone]  If 7PM-7AM, please contact night-coverage 01/05/2021, 7:56 AM

## 2021-01-05 NOTE — Interval H&P Note (Signed)
History and Physical Interval Note:  01/05/2021 11:11 AM  Marvin Williams.  has presented today for surgery, with the diagnosis of Right leg angio.  The various methods of treatment have been discussed with the patient and family. After consideration of risks, benefits and other options for treatment, the patient has consented to  Procedure(s): LOWER EXTREMITY INTERVENTION (Right) as a surgical intervention.  The patient's history has been reviewed, patient examined, no change in status, stable for surgery.  I have reviewed the patient's chart and labs.  Questions were answered to the patient's satisfaction.     Leotis Pain

## 2021-01-05 NOTE — Consult Note (Signed)
ANTICOAGULATION CONSULT NOTE - Initial Consult  Pharmacy Consult for heparin infusion Indication: acute superficial arterial occlusion   Allergies  Allergen Reactions   Naproxen Other (See Comments)    Per patient: oncologist prefers he does NOT take     Patient Measurements: Height: 5\' 8"  (172.7 cm) Weight: 79.8 kg (176 lb) IBW/kg (Calculated) : 68.4   Vital Signs: BP: 146/77 (08/08 0200) Pulse Rate: 60 (08/08 0200)  Labs: Recent Labs    01/03/21 1634 01/03/21 1948 01/04/21 0603 01/04/21 1616 01/05/21 0230  HGB 15.8  --  15.5  --   --   HCT 46.1  --  44.8  --   --   PLT 228  --  236  --   --   APTT  --  37*  --   --   --   LABPROT  --  13.7  --   --   --   INR  --  1.0  --   --   --   HEPARINUNFRC  --   --  0.81* 0.56 0.18*  CREATININE 1.56*  --  1.26*  --   --   TROPONINIHS 9  --   --   --   --      Estimated Creatinine Clearance: 48.3 mL/min (A) (by C-G formula based on SCr of 1.26 mg/dL (H)).   Medical History: Past Medical History:  Diagnosis Date   Cancer ([place])    skin   HTN (hypertension)    Hyperlipemia    Lung cancer ([place])    Peripheral arterial disease (HCC)     Medications:  No prior anticoagulation noted  Per wife, has not been taking antiplatelets in over a month  Assessment: 75 y.o. male  with hx of PAD presents with acute onset right leg pain/swelling. Per family, stopped taking all this medications including antiplatelets over a month ago. CT showing superficial femoral and popliteal ateria occlusion. Pharmacy has been consulted for heparin infusion  8/7 0603 HL = 0.81 1400 > 1250 8/7 1616 HL = 0.56 therapeutic x 1   Goal of Therapy:  Heparin level 0.3-0.7 units/ml Monitor platelets by anticoagulation protocol: Yes   Plan:  8/8:  HL @ 0230 = 0.18 Will order Heparin 2000 units IV X 1 bolus and increase drip rate to 1500 units/hr.  Will recheck HL 8 hrs after rate change.   Orene Desanctis, PharmD Clinical Pharmacist    01/05/2021,3:10 AM

## 2021-01-05 NOTE — Progress Notes (Addendum)
PHARMACY NOTE:  RENAL DOSAGE ADJUSTMENT  Aggrastat ordered dose was in discrepancy of dose and renal function .As per policy approved by the [facility], Aggrastat dosage will be adjusted accordingly.  Current Aggrastat dosage:  0.67mcg/kg/minute  Renal Function:  Estimated Creatinine Clearance: 48.3 mL/min (A) (by C-G formula based on SCr of 1.26 mg/dL (H)).  Dosage has been changed to:  0.016mcg/kg/minute  Additional comments:  Duration was also clarified with Dr Lucky Cowboy   Thank you for allowing pharmacy to be a part of this patient's care.  Elishua Radford Rodriguez-Guzman PharmD, BCPS 01/05/2021 1:30 PM

## 2021-01-05 NOTE — Op Note (Signed)
[facility]  Percutaneous Study/Intervention Procedural Note   Date of Surgery: 01/05/2021  Surgeon(s):Rayla Pember    Assistants:none  Pre-operative Diagnosis: PAD with rest pain, acute on chronic ischemia RLE  Post-operative diagnosis:  Same  Procedure(s) Performed:             1.  Ultrasound guidance for vascular access left femoral artery             2.  Catheter placement into right common femoral artery from left femoral approach             3.  Aortogram and selective right lower extremity angiogram             4.  Mechanical thrombectomy of the right SFA, popliteal artery, tibioperoneal trunk, and posterior tibial arteries with the penumbra CAT 6 device             5.  Catheter directed thrombolytic therapy with 6 mg of tPA to the right SFA and popliteal arteries  6.  Covered stent x2 to the right SFA and popliteal arteries with a pair of 6 mm diameter by 25 cm length Viabahn stent  7.  Angioplasty of the right tibioperoneal trunk and posterior tibial arteries with 2.5 mm diameter by 30 cm length angioplasty             8.  StarClose closure device left femoral artery  EBL: 200 cc  Contrast: 70 cc  Fluoro Time: 10.3 minutes  Moderate Conscious Sedation Time: approximately 80 minutes using 3 mg of Versed and 75 mcg of Fentanyl              Indications:  Patient is a 75 y.o.male with acute on chronic ischemia of the right lower extremity after previous interventions.  This just started within the past 48 hours. The patient has a CT scan demonstrating occlusion of his previous stents. The patient is brought in for angiography for further evaluation and potential treatment.  Due to the limb threatening nature of the situation, angiogram was performed for attempted limb salvage. The patient is aware that if the procedure fails, amputation would be expected.  The patient also understands that even with successful revascularization, amputation may still be  required due to the severity of the situation.  Risks and benefits are discussed and informed consent is obtained.   Procedure:  The patient was identified and appropriate procedural time out was performed.  The patient was then placed supine on the table and prepped and draped in the usual sterile fashion. Moderate conscious sedation was administered during a face to face encounter with the patient throughout the procedure with my supervision of the RN administering medicines and monitoring the patient's vital signs, pulse oximetry, telemetry and mental status throughout from the start of the procedure until the patient was taken to the recovery room. Ultrasound was used to evaluate the left common femoral artery.  It was patent .  A digital ultrasound image was acquired.  A Seldinger needle was used to access the left common femoral artery under direct ultrasound guidance and a permanent image was performed.  A 0.035 J wire was advanced without resistance and a 5Fr sheath was placed.  Pigtail catheter was placed into the aorta and an AP aortogram was performed. This demonstrated normal renal arteries and normal aorta and iliac segments without significant stenosis. I then crossed the aortic bifurcation and advanced to the right femoral head. Selective right lower extremity angiogram was then performed.  This demonstrated mild disease the common femoral artery with a patent profunda femoris artery.  The SFA occluded a centimeter to beyond its origin which was a few centimeters above the previously placed stent with abrupt occlusion.  On initial imaging, there was faint reconstitution of the peroneal and posterior tibial artery distally but all were occluded in the proximal segments and there was occlusion throughout the SFA and popliteal arteries. It was felt that it was in the patient's best interest to proceed with intervention after these images to avoid a second procedure and a larger amount of contrast and  fluoroscopy based off of the findings from the initial angiogram. The patient was systemically heparinized and a 6 [place] [employer] sheath was then placed over the [employer] wire. I then used a Kumpe catheter and the advantage wire to easily navigate through the occlusion in the SFA and popliteal arteries and the wire went easily into the posterior tibial artery distally.  I then instilled 6 mg of tPA in the right SFA and popliteal arteries.  After this dwelled, the mechanical thrombectomy device in the form of the penumbra CAT 6 device was brought on the field and 3 passes were made in the right SFA, popliteal arteries, tibioperoneal trunk, and proximal posterior tibial arteries.  This did result in significant thrombus removed, but there remained stenosis and thrombus both the proximal edge and previous placed stent as well as the distal edge of the previously placed stent in the popliteal artery.  There is still very sluggish runoff distally.  I elected to go ahead and cover the entire SFA and popliteal arteries to exclude these areas of thrombus and stenosis.  A pair of 6 mm diameter by 25 cm length Viabahn stents were then deployed from the below-knee popliteal artery up to the proximal SFA about 1 to 2 cm beyond the origin from the SFA.  These were postdilated with 5 mm balloon with excellent angiographic completion result throughout the SFA and popliteal arteries and less than 10% residual stenosis, but there remained very sluggish runoff distally.  2 more passes with the penumbra CAT 6 device were performed in the right tibioperoneal trunk and posterior tibial artery with only minimal improvement seen.  I then performed angioplasty with a 2.5 mm diameter by 30 cm length angioplasty balloon from the mid to distal posterior tibial artery up through the tibioperoneal trunk into the below-knee popliteal artery.  This inflated to 10 atm for 1 minute.  Completion imaging showed a marked improvement with the  less than 25% residual stenosis in the tibioperoneal trunk and posterior tibial arteries.  There was some spasm distally in the posterior tibial artery but there is now also a peroneal artery that had runoff as well. I elected to terminate the procedure. The sheath was removed and StarClose closure device was deployed in the left femoral artery with excellent hemostatic result. The patient was taken to the recovery room in stable condition having tolerated the procedure well.  Findings:               Aortogram:  normal renal arteries, normal aorta and iliac arteries             Right Lower Extremity: Mild disease the common femoral artery with a patent profunda femoris artery.  The SFA occluded a centimeter to beyond its origin which was a few centimeters above the previously placed stent with abrupt occlusion.  On initial imaging, there was faint reconstitution of the peroneal and  posterior tibial artery distally but all were occluded in the proximal segments and there was occlusion throughout the SFA and popliteal arteries.   Disposition: Patient was taken to the recovery room in stable condition having tolerated the procedure well.  Complications: None  Leotis Pain 01/05/2021 12:44 PM   This note was created with Dragon Medical transcription system. Any errors in dictation are purely unintentional.

## 2021-01-05 NOTE — Progress Notes (Signed)
Pt. Had N/V of approx. 200 ml clear emesis upon arrival to Recovery. Pt. Given zofran 4 mg IV with 100% relief per pt. Dr. Lucky Cowboy called and spoke with pt. Daughter Lattie Haw and also spoke with pt. In person now. Pt. Stable for tx. Back to room.

## 2021-01-06 ENCOUNTER — Other Ambulatory Visit (INDEPENDENT_AMBULATORY_CARE_PROVIDER_SITE_OTHER): Payer: Self-pay | Admitting: Vascular Surgery

## 2021-01-06 MED ORDER — APIXABAN 5 MG PO TABS: 5.0000 mg | ORAL_TABLET | Freq: Two times a day (BID) | ORAL | 0 refills | Status: DC

## 2021-01-06 MED ORDER — ASPIRIN 81 MG PO TBEC: 81.0000 mg | DELAYED_RELEASE_TABLET | Freq: Every day | ORAL | 11 refills | Status: AC

## 2021-01-06 MED ORDER — AMLODIPINE BESYLATE 5 MG PO TABS: 5.0000 mg | ORAL_TABLET | Freq: Every day | ORAL | 0 refills | Status: AC

## 2021-01-06 MED ORDER — ASPIRIN 81 MG PO TBEC: 81.0000 mg | DELAYED_RELEASE_TABLET | Freq: Every day | ORAL | 11 refills | Status: DC

## 2021-01-06 MED ORDER — AMLODIPINE BESYLATE 5 MG PO TABS: 5.0000 mg | ORAL_TABLET | Freq: Every day | ORAL | 0 refills | Status: DC

## 2021-01-06 MED ORDER — ASPIRIN EC 81 MG PO TBEC
81.0000 mg | DELAYED_RELEASE_TABLET | Freq: Every day | ORAL | Status: DC
  Administered 2021-01-06: 81 mg via ORAL
  Filled 2021-01-06: qty 1

## 2021-01-06 MED ORDER — APIXABAN 5 MG PO TABS
5.0000 mg | ORAL_TABLET | Freq: Two times a day (BID) | ORAL | Status: DC
  Administered 2021-01-06: 5 mg via ORAL
  Filled 2021-01-06: qty 1

## 2021-01-06 MED ORDER — CLOPIDOGREL BISULFATE 75 MG PO TABS: 75.0000 mg | ORAL_TABLET | Freq: Every day | ORAL | Status: DC

## 2021-01-06 NOTE — Discharge Summary (Signed)
[name] DOB: 02/28/1946 DOA: 01/03/2021  PCP: Adin Hector, MD  Admit date: 01/03/2021 Discharge date: 01/06/2021  Admitted From: Home Disposition: Home  Recommendations for Outpatient Follow-up:  Follow up with PCP in 1 week Please obtain BMP/CBC in one week Please follow up with Dr. Lucky Cowboy vascular surgery in 2 weeks     Discharge Condition:Stable CODE STATUS: Full Diet recommendation: Heart Healthy  Brief/Interim Summary: Per Marvin Williams. is a 75 y.o. male with medical history significant for peripheral artery disease status post stents to the right superficial femoral popliteal arteries, hypertension, hyperlipidemia, BPH, CKD 3A with baseline creatinine 1.3-1.6, who is admitted to [facility] on 01/03/2021 with partial arterial occlusion in the right lower extremity after presenting from home to [facility] ED complaining of right lower extremity pain   CT angio of aortobifemoral lower extremity was obtained with results below.  Vascular surgery was consulted.   #) partial arterial occlusion in right lower extremity: In the context of a documented history of b/l peripheral artery disease status post previous right superficial femoral and popliteal artery stents followed by femoropopliteal bypass, the patient presents with 1 day of right calf/foot claudication symptoms  Was started on heparin drip.  Vascular surgery was consulted.  He is status post right lower extremity angiogram on 8/8 by Dr Lucky Cowboy. S/p   Aortogram and selective right lower extremity angiogram          - Mechanical thrombectomy of the right SFA, popliteal artery, tibioperoneal trunk, and posterior tibial arteries with the penumbra CAT 6 device             -Catheter directed thrombolytic therapy with 6 mg of tPA to the right SFA and popliteal arteries             -Covered stent x2 to the right SFA and popliteal arteries with a pair of 6 mm diameter by 25 cm length Viabahn  stent             -Angioplasty of the right tibioperoneal trunk and posterior tibial arteries with 2.5 mm diameter by 30 cm length angioplasty   Vascular surgery was okay with patient being discharged home on aspirin, Eliquis, and statin follow-up with vascular clinic at that time will need ABI    #) Leukocytosis: Reactive/stress induced. 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 Active Problems:   Essential hypertension   Claudication of right lower extremity (HCC)   Leukocytosis   Hyperlipemia   Peripheral arterial disease ([place])    Discharge Instructions  Discharge Instructions     Call MD for:  difficulty breathing, headache or visual disturbances   Complete by: As directed    Call MD for:  temperature >100.4   Complete by: As directed    Diet - low sodium heart healthy   Complete by: As directed    Increase activity slowly   Complete by: As  directed       Allergies as of 01/06/2021       Reactions   Naproxen Other (See Comments)   Per patient: oncologist prefers he does NOT take         Medication List     STOP taking these medications    clopidogrel 75 MG tablet Commonly known as: PLAVIX   hydrochlorothiazide 12.5 MG tablet Commonly known as: HYDRODIURIL   losartan-hydrochlorothiazide 100-12.5 MG tablet Commonly known as: HYZAAR   traMADol 50 MG tablet Commonly known as: Ultram       TAKE these medications    amLODipine 5 MG tablet Commonly known as: NORVASC Take 1 tablet (5 mg total) by mouth daily. Start taking on: January 07, 2021    apixaban 5 MG Tabs tablet Commonly known as: ELIQUIS Take 1 tablet (5 mg total) by mouth 2 (two) times daily.   aspirin 81 MG EC tablet Take 1 tablet (81 mg total) by mouth daily. Swallow whole.   atorvastatin 10 MG tablet Commonly known as: LIPITOR TAKE 1 TABLET BY MOUTH DAILY   gabapentin 100 MG capsule Commonly known as: NEURONTIN Take 100 mg by mouth daily.   tamsulosin 0.4 MG Caps capsule Commonly known as: FLOMAX Take 1 capsule (0.4 mg total) by mouth daily.        Follow-up Information     Dew, Erskine Squibb, MD Follow up in 2 week(s).   Specialties: Vascular Surgery, Radiology, Interventional Cardiology Why: Can see Dew or Arna Medici. Will need ABI with visit. Contact information: [place] [address] [phone]         Tama High III, MD Follow up in 1 week(s).   Specialty: Internal Medicine Why: need blood work Contact information: [place] [address] [phone]                Allergies  Allergen Reactions   Naproxen Other (See Comments)    Per patient: oncologist prefers he does NOT take     Consultations: Vascular surgery   Procedures/Studies: CT Angio Aortobifemoral W and/or Wo Contrast  Result Date: 01/03/2021 CLINICAL DATA:  Right toe ischemia, history of previous revascularization, right lower lobe lung cancer EXAM: CT ANGIOGRAPHY OF ABDOMINAL AORTA WITH ILIOFEMORAL RUNOFF TECHNIQUE: Multidetector CT imaging of the abdomen, pelvis and lower extremities was performed using the standard protocol during bolus administration of intravenous contrast. Multiplanar CT image reconstructions and MIPs were obtained to evaluate the vascular anatomy. CONTRAST:  161mL OMNIPAQUE IOHEXOL 350 MG/ML SOLN COMPARISON:  None. FINDINGS: VASCULAR Severe mixed calcific aortic atherosclerosis. No evidence of aneurysm, dissection, or other acute aortic pathology. Duplication of the right renal arteries  with a small accessory superior pole right renal artery, with a solitary left renal artery. Atherosclerosis at the aortic branch vessel origins without high-grade stenosis. RIGHT Lower Extremity Inflow: Common, internal and external iliac arteries are patent without evidence of aneurysm, dissection, vasculitis or significant stenosis. Moderate to severe mixed calcific atherosclerosis. Mild tortuosity. Outflow: There is long segment occlusion of the right superficial femoral and popliteal arteries from the origin, including total occlusion of a superficial femoral arterial and popliteal artery stent. The profunda femoris is patent. Runoff: There is some intermittent reconstitution of the trifurcation vessels in the right lower extremity, presumably via geniculate collaterals. LEFT Lower Extremity Inflow: Common and external iliac arteries are patent without evidence of aneurysm, dissection, vasculitis or significant stenosis. 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 Moderate to severe mixed calcific atherosclerosis. Mild tortuosity. Outflow: Common, superficial and profunda femoral arteries are patent without evidence of aneurysm, dissection, vasculitis or significant stenosis. The superficial femoral artery terminates in geniculate collaterals, and the popliteal artery and trifurcation vessels are supplied by a persistent left sciatic artery. The left popliteal artery is almost completely occluded across the level of the knee joint, and reconstituted below the knee joint by geniculate collaterals. Runoff: There is intermittent reconstitution of the trifurcation vessels via geniculate collateral flow. Veins: No obvious venous abnormality within the limitations of this arterial phase study. Review of the MIP images confirms the  above findings. NON-VASCULAR Lower chest: No acute abnormality. Hepatobiliary: No focal liver abnormality is seen. No gallstones, gallbladder wall thickening, or biliary dilatation. Pancreas: Unremarkable. No pancreatic ductal dilatation or surrounding inflammatory changes. Spleen: Normal in size without focal abnormality. Adrenals/Urinary Tract: Adrenal glands are unremarkable. Multiple bilateral renal cortical and parapelvic cysts. Kidneys are otherwise normal, without renal calculi, solid lesion, or hydronephrosis. Bladder is unremarkable. Stomach/Bowel: Stomach is within normal limits. Appendix appears normal. No evidence of bowel wall thickening, distention, or inflammatory changes. Lymphatic: No significant vascular findings are present. No enlarged abdominal or pelvic lymph nodes. Reproductive: Uterus and bilateral adnexa are unremarkable. Other: Small, fat containing bilateral inguinal hernias. No abdominopelvic ascites. Musculoskeletal: No acute or significant osseous findings. IMPRESSION: 1. There is long segment occlusion of the right superficial femoral and popliteal arteries from the origin, including total occlusion of a superficial femoral and popliteal artery stents. There is some intermittent reconstitution of the trifurcation vessels in the right lower extremity, presumably via geniculate collaterals. 2. 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 3. 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 The left popliteal artery is almost completely occluded across the level of the knee joint, and reconstituted below the knee joint by geniculate collaterals. There is intermittent  reconstitution of the trifurcation vessels via geniculate collateral flow. 4. Aortic atherosclerosis. 5. No acute findings in the abdomen or pelvis on early arterial phase examination. Aortic Atherosclerosis (ICD10-I70.0). Electronically Signed   By: Eddie Candle M.D.   On: 01/03/2021 18:01   PERIPHERAL VASCULAR CATHETERIZATION  Result Date: 01/05/2021 See surgical note for result.     Subjective: No pain, no chest pain, no shortness of breath.  Would like to go home.  Discharge Exam: Vitals:   01/06/21 0343 01/06/21 0733  BP: (!) 143/72 138/79  Pulse: 96 85  Resp:  19  Temp: 98.1 F (36.7 C) 98.2 F (36.8 C)  SpO2: 95% 97%   Vitals:   01/05/21 1943 01/06/21 0343 01/06/21 0402 01/06/21 0733  BP: 122/72 (!) 143/72  138/79  Pulse: (!) 104 96  85  Resp: 20   19  Temp: 98.1 F (36.7 C) 98.1 F (36.7 C)  98.2 F (36.8 C)  TempSrc:      SpO2: 94% 95%  97%  Weight:   79.5 kg   Height:        General: Pt is alert, awake, not in acute distress Cardiovascular: RRR, S1/S2 +, no rubs,  no gallops Respiratory: CTA bilaterally, no wheezing, no rhonchi Abdominal: Soft, NT, ND, bowel sounds + Extremities: no edema, no cyanosis    The results of significant diagnostics from this hospitalization (including imaging, microbiology, ancillary and laboratory) are listed below for reference.     Microbiology: Recent Results (from the past 240 hour(s))  Resp Panel by RT-PCR (Flu A&B, Covid) Nasopharyngeal Swab     Status: None   Collection Time: 01/03/21  7:48 PM   Specimen: Nasopharyngeal Swab; Nasopharyngeal(NP) swabs in vial transport medium  Result Value Ref Range Status   SARS Coronavirus 2 by RT PCR NEGATIVE NEGATIVE Final    Comment: (NOTE) SARS-CoV-2 target nucleic acids are NOT DETECTED.  The SARS-CoV-2 RNA is generally detectable in upper respiratory specimens during the acute phase of infection. The lowest concentration of SARS-CoV-2 viral copies this assay can detect  is 138 copies/mL. A negative result does not preclude SARS-Cov-2 infection and should not be used as the sole basis for treatment or other patient management decisions. A negative result may occur with  improper specimen collection/handling, submission of specimen other than nasopharyngeal swab, presence of viral mutation(s) within the areas targeted by this assay, and inadequate number of viral copies(<138 copies/mL). A negative result must be combined with clinical observations, patient history, and epidemiological information. The expected result is Negative.  Fact Sheet for Patients:  EntrepreneurPulse.com.au  Fact Sheet for Healthcare Providers:  IncredibleEmployment.be  This test is no t yet approved or cleared by the [place] FDA and  has been authorized for detection and/or diagnosis of SARS-CoV-2 by FDA under an Emergency Use Authorization (EUA). This EUA will remain  in effect (meaning this test can be used) for the duration of the COVID-19 declaration under Section 564(b)(1) of the Act, 21 U.S.C.section 360bbb-3(b)(1), unless the authorization is terminated  or revoked sooner.       Influenza A by PCR NEGATIVE NEGATIVE Final   Influenza B by PCR NEGATIVE NEGATIVE Final    Comment: (NOTE) The Xpert Xpress SARS-CoV-2/FLU/RSV plus assay is intended as an aid in the diagnosis of influenza from Nasopharyngeal swab specimens and should not be used as a sole basis for treatment. Nasal washings and aspirates are unacceptable for Xpert Xpress SARS-CoV-2/FLU/RSV testing.  Fact Sheet for Patients: EntrepreneurPulse.com.au  Fact Sheet for Healthcare Providers: IncredibleEmployment.be  This test is not yet approved or cleared by the [place] FDA and has been authorized for detection and/or diagnosis of SARS-CoV-2 by FDA under an Emergency Use Authorization (EUA). This EUA will remain in effect  (meaning this test can be used) for the duration of the COVID-19 declaration under Section 564(b)(1) of the Act, 21 U.S.C. section 360bbb-3(b)(1), unless the authorization is terminated or revoked.  Performed at [facility], [address] [place], [place] [address]      Labs: BNP (last 3 results) No results for input(s): BNP in the last 8760 hours. Basic Metabolic Panel: Recent Labs  Lab 01/03/21 1634 01/03/21 1948 01/04/21 0603  NA 137  --  134*  K 3.8  --  3.4*  CL 103  --  106  CO2 25  --  24  GLUCOSE 112*  --  96  BUN 23  --  20  CREATININE 1.56*  --  1.26*  CALCIUM 9.1  --  8.4*  MG  --  1.5* 2.3   Liver Function Tests: Recent Labs  Lab 01/03/21 1634 01/04/21 0603  AST 22 30  ALT 18 19  ALKPHOS 65  58  BILITOT 0.8 1.0  PROT 7.5 7.0  ALBUMIN 4.1 4.0   No results for input(s): LIPASE, AMYLASE in the last 168 hours. No results for input(s): AMMONIA in the last 168 hours. CBC: Recent Labs  Lab 01/03/21 1634 01/04/21 0603 01/05/21 0508  WBC 14.2* 8.6 7.7  NEUTROABS 12.3*  --   --   HGB 15.8 15.5 15.4  HCT 46.1 44.8 45.2  MCV 89.0 90.0 89.7  PLT 228 236 230   Cardiac Enzymes: No results for input(s): CKTOTAL, CKMB, CKMBINDEX, TROPONINI in the last 168 hours. BNP: Invalid input(s): POCBNP CBG: No results for input(s): GLUCAP in the last 168 hours. D-Dimer No results for input(s): DDIMER in the last 72 hours. Hgb A1c No results for input(s): HGBA1C in the last 72 hours. Lipid Profile No results for input(s): CHOL, HDL, LDLCALC, TRIG, CHOLHDL, LDLDIRECT in the last 72 hours. Thyroid function studies No results for input(s): TSH, T4TOTAL, T3FREE, THYROIDAB in the last 72 hours.  Invalid input(s): FREET3 Anemia work up No results for input(s): VITAMINB12, FOLATE, FERRITIN, TIBC, IRON, RETICCTPCT in the last 72 hours. Urinalysis    Component Value Date/Time   COLORURINE STRAW (A) 01/03/2021 2045   APPEARANCEUR CLEAR (A) 01/03/2021  2045   LABSPEC 1.027 01/03/2021 2045   PHURINE 6.0 01/03/2021 2045   GLUCOSEU NEGATIVE 01/03/2021 2045   HGBUR SMALL (A) 01/03/2021 2045   BILIRUBINUR NEGATIVE 01/03/2021 2045   KETONESUR NEGATIVE 01/03/2021 2045   PROTEINUR NEGATIVE 01/03/2021 2045   NITRITE NEGATIVE 01/03/2021 2045   LEUKOCYTESUR NEGATIVE 01/03/2021 2045   Sepsis Labs Invalid input(s): PROCALCITONIN,  WBC,  LACTICIDVEN Microbiology Recent Results (from the past 240 hour(s))  Resp Panel by RT-PCR (Flu A&B, Covid) Nasopharyngeal Swab     Status: None   Collection Time: 01/03/21  7:48 PM   Specimen: Nasopharyngeal Swab; Nasopharyngeal(NP) swabs in vial transport medium  Result Value Ref Range Status   SARS Coronavirus 2 by RT PCR NEGATIVE NEGATIVE Final    Comment: (NOTE) SARS-CoV-2 target nucleic acids are NOT DETECTED.  The SARS-CoV-2 RNA is generally detectable in upper respiratory specimens during the acute phase of infection. The lowest concentration of SARS-CoV-2 viral copies this assay can detect is 138 copies/mL. A negative result does not preclude SARS-Cov-2 infection and should not be used as the sole basis for treatment or other patient management decisions. A negative result may occur with  improper specimen collection/handling, submission of specimen other than nasopharyngeal swab, presence of viral mutation(s) within the areas targeted by this assay, and inadequate number of viral copies(<138 copies/mL). A negative result must be combined with clinical observations, patient history, and epidemiological information. The expected result is Negative.  Fact Sheet for Patients:  EntrepreneurPulse.com.au  Fact Sheet for Healthcare Providers:  IncredibleEmployment.be  This test is no t yet approved or cleared by the [place] FDA and  has been authorized for detection and/or diagnosis of SARS-CoV-2 by FDA under an Emergency Use Authorization (EUA). This EUA will  remain  in effect (meaning this test can be used) for the duration of the COVID-19 declaration under Section 564(b)(1) of the Act, 21 U.S.C.section 360bbb-3(b)(1), unless the authorization is terminated  or revoked sooner.       Influenza A by PCR NEGATIVE NEGATIVE Final   Influenza B by PCR NEGATIVE NEGATIVE Final    Comment: (NOTE) The Xpert Xpress SARS-CoV-2/FLU/RSV plus assay is intended as an aid in the diagnosis of influenza from Nasopharyngeal swab specimens and should not be used  as a sole basis for treatment. Nasal washings and aspirates are unacceptable for Xpert Xpress SARS-CoV-2/FLU/RSV testing.  Fact Sheet for Patients: EntrepreneurPulse.com.au  Fact Sheet for Healthcare Providers: IncredibleEmployment.be  This test is not yet approved or cleared by the [place] FDA and has been authorized for detection and/or diagnosis of SARS-CoV-2 by FDA under an Emergency Use Authorization (EUA). This EUA will remain in effect (meaning this test can be used) for the duration of the COVID-19 declaration under Section 564(b)(1) of the Act, 21 U.S.C. section 360bbb-3(b)(1), unless the authorization is terminated or revoked.  Performed at [facility], [address] [place], [place] [address]      Time coordinating discharge: Over 30 minutes  SIGNED:   Nolberto Hanlon, MD  [facility] 01/06/2021, 9:50 AM Pager   If 7PM-7AM, please contact night-coverage [email or web address] Password [email or web address]

## 2021-01-06 NOTE — Progress Notes (Signed)
[facility] Daily Progress Note  01/05/21:             1.  Ultrasound guidance for vascular access left femoral artery             2.  Catheter placement into right common femoral artery from left femoral approach             3.  Aortogram and selective right lower extremity angiogram             4.  Mechanical thrombectomy of the right SFA, popliteal artery, tibioperoneal trunk, and posterior tibial arteries with the penumbra CAT 6 device             5.  Catheter directed thrombolytic therapy with 6 mg of tPA to the right SFA and popliteal arteries             6.  Covered stent x2 to the right SFA and popliteal arteries with a pair of 6 mm diameter by 25 cm length Viabahn stent             7.  Angioplasty of the right tibioperoneal trunk and posterior tibial arteries with 2.5 mm diameter by 30 cm length angioplasty             8.  StarClose closure device left femoral artery  Subjective: Patient without complaint this AM. No issues overnight.   Objective: Vitals:   01/05/21 1943 01/06/21 0343 01/06/21 0402 01/06/21 0733  BP: 122/72 (!) 143/72  138/79  Pulse: (!) 104 96  85  Resp: 20   19  Temp: 98.1 F (36.7 C) 98.1 F (36.7 C)  98.2 F (36.8 C)  TempSrc:      SpO2: 94% 95%  97%  Weight:   79.5 kg   Height:        Intake/Output Summary (Last 24 hours) at 01/06/2021 1042 Last data filed at 01/06/2021 0950 Gross per 24 hour  Intake 1190.57 ml  Output 900 ml  Net 290.57 ml   Physical Exam: A&Ox3, NAD CV: RRR Pulmonary: CTA Bilaterally Abdomen: Soft, Non-tender, Non-distended Left Groin:  Access Site: Clean and dry. No swelling or drainage. Vascular:  Right Lower Extremity: Thigh soft, calf soft. Extremity is warm distally. Good capillary refill.    Laboratory: CBC    Component Value Date/Time   WBC 7.7 01/05/2021 0508   HGB 15.4 01/05/2021 0508   HCT 45.2 01/05/2021 0508   PLT 230 01/05/2021 0508   BMET    Component Value Date/Time   NA 134  (L) 01/04/2021 0603   K 3.4 (L) 01/04/2021 0603   CL 106 01/04/2021 0603   CO2 24 01/04/2021 0603   GLUCOSE 96 01/04/2021 0603   BUN 20 01/04/2021 0603   CREATININE 1.26 (H) 01/04/2021 0603   CALCIUM 8.4 (L) 01/04/2021 0603   GFRNONAA 59 (L) 01/04/2021 0603   GFRAA 49 (L) 11/14/2019 0954   Assessment/Planning: The patient is a 75 year old male with chronic ischemia to RLE s/p angiogram - POD#1  1) Patient notes an improvement in his discomfort s/p intervention 2) ASA, Eliquis and Statin for medical management 3) Will see patient in clinic in one month with an ABI  Discussed with Dr. Ellis Parents Mele Sylvester PA-C 01/06/2021 10:42 AM

## 2021-01-06 NOTE — Care Management Important Message (Signed)
Important Message  Patient Details  Name: Marvin Williams. MRN: [id number] Date of Birth: 1945/09/28   Medicare Important Message Given:  Yes     Dannette Barbara 01/06/2021, 10:48 AM

## 2021-01-14 ENCOUNTER — Other Ambulatory Visit (INDEPENDENT_AMBULATORY_CARE_PROVIDER_SITE_OTHER): Payer: Self-pay | Admitting: Vascular Surgery

## 2021-01-16 ENCOUNTER — Other Ambulatory Visit (INDEPENDENT_AMBULATORY_CARE_PROVIDER_SITE_OTHER): Payer: Self-pay | Admitting: Vascular Surgery

## 2021-01-16 DIAGNOSIS — I70221 Atherosclerosis of native arteries of extremities with rest pain, right leg: Secondary | ICD-10-CM

## 2021-01-16 DIAGNOSIS — Z95820 Peripheral vascular angioplasty status with implants and grafts: Secondary | ICD-10-CM

## 2021-01-19 ENCOUNTER — Ambulatory Visit: Payer: Medicare Other | Admitting: Radiation Oncology

## 2021-01-21 ENCOUNTER — Encounter (INDEPENDENT_AMBULATORY_CARE_PROVIDER_SITE_OTHER): Payer: Medicare Other

## 2021-01-21 ENCOUNTER — Ambulatory Visit (INDEPENDENT_AMBULATORY_CARE_PROVIDER_SITE_OTHER): Payer: Medicare Other | Admitting: Nurse Practitioner

## 2021-01-21 ENCOUNTER — Encounter (INDEPENDENT_AMBULATORY_CARE_PROVIDER_SITE_OTHER): Payer: Self-pay | Admitting: Vascular Surgery

## 2021-03-05 ENCOUNTER — Other Ambulatory Visit (INDEPENDENT_AMBULATORY_CARE_PROVIDER_SITE_OTHER): Payer: Self-pay | Admitting: Vascular Surgery

## 2021-03-13 ENCOUNTER — Telehealth (INDEPENDENT_AMBULATORY_CARE_PROVIDER_SITE_OTHER): Payer: Self-pay | Admitting: Vascular Surgery

## 2021-03-13 ENCOUNTER — Other Ambulatory Visit (INDEPENDENT_AMBULATORY_CARE_PROVIDER_SITE_OTHER): Payer: Self-pay | Admitting: Nurse Practitioner

## 2021-03-13 MED ORDER — APIXABAN 5 MG PO TABS: 5.0000 mg | ORAL_TABLET | Freq: Two times a day (BID) | ORAL | 0 refills | Status: AC

## 2021-03-13 NOTE — Telephone Encounter (Signed)
From a vascular standpoint, he should be on aspirin and eliquis.  As far as the tamsulosin and amlodipine, he needs to contact his PCP.  I sent a refill of his eliquis but it is only for one month.  He had an appointment with [place] that was no showed after his angio in august.  He needs to come in with ABIs before he can get any refills

## 2021-03-13 NOTE — Telephone Encounter (Signed)
Made patient aware of NP's note ; and r/s'd his post op appt from le agnio done in Aug

## 2021-03-13 NOTE — Telephone Encounter (Signed)
Called to get clarity of Rx that he is suppose to be taking. Patient is currently taking Asprin, tamsulosin, amloopine (no refill), elliquis (no refill)  Please advise.

## 2021-03-31 ENCOUNTER — Ambulatory Visit (INDEPENDENT_AMBULATORY_CARE_PROVIDER_SITE_OTHER): Payer: Medicare Other | Admitting: Vascular Surgery

## 2021-03-31 ENCOUNTER — Encounter (INDEPENDENT_AMBULATORY_CARE_PROVIDER_SITE_OTHER): Payer: Medicare Other

## 2021-06-05 ENCOUNTER — Ambulatory Visit: Payer: Medicare Other

## 2021-06-12 ENCOUNTER — Inpatient Hospital Stay: Payer: Medicare Other | Attending: Oncology

## 2021-06-12 ENCOUNTER — Ambulatory Visit: Payer: Medicare Other | Admitting: Radiation Oncology

## 2021-06-12 ENCOUNTER — Inpatient Hospital Stay: Payer: Medicare Other | Admitting: Oncology

## 2022-03-29 ENCOUNTER — Encounter (INDEPENDENT_AMBULATORY_CARE_PROVIDER_SITE_OTHER): Payer: Self-pay
